# Patient Record
Sex: Female | Born: 1970 | Race: Black or African American | Hispanic: No | Marital: Single | Smoking: Never smoker
Health system: Southern US, Community
[De-identification: ages and names within clinical notes are randomized; demographics above are authoritative.]

## PROBLEM LIST (undated history)

## (undated) DIAGNOSIS — Z800 Family history of malignant neoplasm of digestive organs: Secondary | ICD-10-CM

## (undated) DIAGNOSIS — D649 Anemia, unspecified: Secondary | ICD-10-CM

## (undated) DIAGNOSIS — Z5189 Encounter for other specified aftercare: Secondary | ICD-10-CM

## (undated) DIAGNOSIS — Z803 Family history of malignant neoplasm of breast: Secondary | ICD-10-CM

## (undated) DIAGNOSIS — Z90710 Acquired absence of both cervix and uterus: Secondary | ICD-10-CM

## (undated) DIAGNOSIS — T7840XA Allergy, unspecified, initial encounter: Secondary | ICD-10-CM

## (undated) DIAGNOSIS — E559 Vitamin D deficiency, unspecified: Secondary | ICD-10-CM

## (undated) DIAGNOSIS — Z973 Presence of spectacles and contact lenses: Secondary | ICD-10-CM

## (undated) DIAGNOSIS — G43909 Migraine, unspecified, not intractable, without status migrainosus: Secondary | ICD-10-CM

## (undated) DIAGNOSIS — L918 Other hypertrophic disorders of the skin: Secondary | ICD-10-CM

## (undated) HISTORY — DX: Encounter for other specified aftercare: Z51.89

## (undated) HISTORY — DX: Acquired absence of both cervix and uterus: Z90.710

## (undated) HISTORY — DX: Presence of spectacles and contact lenses: Z97.3

## (undated) HISTORY — DX: Other hypertrophic disorders of the skin: L91.8

## (undated) HISTORY — DX: Migraine, unspecified, not intractable, without status migrainosus: G43.909

## (undated) HISTORY — DX: Family history of malignant neoplasm of digestive organs: Z80.0

## (undated) HISTORY — DX: Allergy, unspecified, initial encounter: T78.40XA

## (undated) HISTORY — DX: Anemia, unspecified: D64.9

## (undated) HISTORY — DX: Vitamin D deficiency, unspecified: E55.9

## (undated) HISTORY — DX: Family history of malignant neoplasm of breast: Z80.3

---

## 1998-08-08 ENCOUNTER — Emergency Department (HOSPITAL_COMMUNITY): Admission: EM | Admit: 1998-08-08 | Discharge: 1998-08-08 | Payer: Self-pay | Admitting: Emergency Medicine

## 1999-08-27 ENCOUNTER — Emergency Department (HOSPITAL_COMMUNITY): Admission: EM | Admit: 1999-08-27 | Discharge: 1999-08-27 | Payer: Self-pay | Admitting: Emergency Medicine

## 1999-10-10 ENCOUNTER — Emergency Department (HOSPITAL_COMMUNITY): Admission: EM | Admit: 1999-10-10 | Discharge: 1999-10-10 | Payer: Self-pay | Admitting: Emergency Medicine

## 1999-11-13 ENCOUNTER — Emergency Department (HOSPITAL_COMMUNITY): Admission: EM | Admit: 1999-11-13 | Discharge: 1999-11-13 | Payer: Self-pay | Admitting: Emergency Medicine

## 2003-05-14 ENCOUNTER — Emergency Department (HOSPITAL_COMMUNITY): Admission: EM | Admit: 2003-05-14 | Discharge: 2003-05-14 | Payer: Self-pay | Admitting: Emergency Medicine

## 2003-12-14 ENCOUNTER — Emergency Department (HOSPITAL_COMMUNITY): Admission: AD | Admit: 2003-12-14 | Discharge: 2003-12-14 | Payer: Self-pay | Admitting: Family Medicine

## 2005-07-10 ENCOUNTER — Inpatient Hospital Stay (HOSPITAL_COMMUNITY): Admission: EM | Admit: 2005-07-10 | Discharge: 2005-07-11 | Payer: Self-pay | Admitting: Emergency Medicine

## 2006-07-08 ENCOUNTER — Ambulatory Visit (HOSPITAL_COMMUNITY): Admission: RE | Admit: 2006-07-08 | Discharge: 2006-07-08 | Payer: Self-pay | Admitting: Family Medicine

## 2006-07-23 ENCOUNTER — Ambulatory Visit: Payer: Self-pay | Admitting: Obstetrics and Gynecology

## 2006-08-12 ENCOUNTER — Ambulatory Visit: Payer: Self-pay | Admitting: Gynecology

## 2006-09-16 ENCOUNTER — Ambulatory Visit: Payer: Self-pay | Admitting: Obstetrics and Gynecology

## 2006-10-26 ENCOUNTER — Encounter (INDEPENDENT_AMBULATORY_CARE_PROVIDER_SITE_OTHER): Payer: Self-pay | Admitting: Specialist

## 2006-10-26 ENCOUNTER — Ambulatory Visit: Payer: Self-pay | Admitting: Obstetrics and Gynecology

## 2006-10-26 ENCOUNTER — Inpatient Hospital Stay (HOSPITAL_COMMUNITY): Admission: RE | Admit: 2006-10-26 | Discharge: 2006-10-28 | Payer: Self-pay | Admitting: Obstetrics and Gynecology

## 2006-11-04 ENCOUNTER — Ambulatory Visit: Payer: Self-pay | Admitting: Obstetrics & Gynecology

## 2006-11-08 ENCOUNTER — Ambulatory Visit: Payer: Self-pay | Admitting: Family Medicine

## 2006-11-08 ENCOUNTER — Inpatient Hospital Stay (HOSPITAL_COMMUNITY): Admission: AD | Admit: 2006-11-08 | Discharge: 2006-11-08 | Payer: Self-pay | Admitting: Family Medicine

## 2006-11-11 ENCOUNTER — Ambulatory Visit: Payer: Self-pay | Admitting: Obstetrics & Gynecology

## 2006-11-18 ENCOUNTER — Ambulatory Visit: Payer: Self-pay | Admitting: Obstetrics & Gynecology

## 2006-12-02 ENCOUNTER — Ambulatory Visit: Payer: Self-pay | Admitting: Obstetrics & Gynecology

## 2007-06-01 ENCOUNTER — Emergency Department (HOSPITAL_COMMUNITY): Admission: EM | Admit: 2007-06-01 | Discharge: 2007-06-01 | Payer: Self-pay | Admitting: Emergency Medicine

## 2007-07-04 ENCOUNTER — Inpatient Hospital Stay (HOSPITAL_COMMUNITY): Admission: EM | Admit: 2007-07-04 | Discharge: 2007-07-06 | Payer: Self-pay | Admitting: Emergency Medicine

## 2007-12-02 HISTORY — PX: COLONOSCOPY: SHX174

## 2007-12-02 HISTORY — PX: ABDOMINAL HYSTERECTOMY: SHX81

## 2008-04-18 ENCOUNTER — Emergency Department (HOSPITAL_COMMUNITY): Admission: EM | Admit: 2008-04-18 | Discharge: 2008-04-19 | Payer: Self-pay | Admitting: Emergency Medicine

## 2008-05-18 ENCOUNTER — Emergency Department (HOSPITAL_COMMUNITY): Admission: EM | Admit: 2008-05-18 | Discharge: 2008-05-18 | Payer: Self-pay | Admitting: Emergency Medicine

## 2010-04-18 ENCOUNTER — Emergency Department (HOSPITAL_COMMUNITY): Admission: EM | Admit: 2010-04-18 | Discharge: 2010-04-19 | Payer: Self-pay | Admitting: Emergency Medicine

## 2010-11-07 ENCOUNTER — Emergency Department (HOSPITAL_COMMUNITY): Admission: EM | Admit: 2010-11-07 | Discharge: 2009-12-06 | Payer: Self-pay | Admitting: Emergency Medicine

## 2011-01-02 ENCOUNTER — Emergency Department (HOSPITAL_COMMUNITY)
Admission: EM | Admit: 2011-01-02 | Discharge: 2011-01-02 | Disposition: A | Discharge status: Home / Self Care | Payer: Self-pay | Attending: Emergency Medicine | Admitting: Emergency Medicine

## 2011-01-02 DIAGNOSIS — R05 Cough: Secondary | ICD-10-CM | POA: Insufficient documentation

## 2011-01-02 DIAGNOSIS — R059 Cough, unspecified: Secondary | ICD-10-CM | POA: Insufficient documentation

## 2011-01-02 DIAGNOSIS — J069 Acute upper respiratory infection, unspecified: Secondary | ICD-10-CM | POA: Insufficient documentation

## 2011-01-02 DIAGNOSIS — R5381 Other malaise: Secondary | ICD-10-CM | POA: Insufficient documentation

## 2011-01-02 DIAGNOSIS — H60399 Other infective otitis externa, unspecified ear: Secondary | ICD-10-CM | POA: Insufficient documentation

## 2011-01-02 DIAGNOSIS — H9209 Otalgia, unspecified ear: Secondary | ICD-10-CM | POA: Insufficient documentation

## 2011-01-02 DIAGNOSIS — R110 Nausea: Secondary | ICD-10-CM | POA: Insufficient documentation

## 2011-01-02 DIAGNOSIS — R070 Pain in throat: Secondary | ICD-10-CM | POA: Insufficient documentation

## 2011-01-02 DIAGNOSIS — R42 Dizziness and giddiness: Secondary | ICD-10-CM | POA: Insufficient documentation

## 2011-01-02 DIAGNOSIS — R630 Anorexia: Secondary | ICD-10-CM | POA: Insufficient documentation

## 2011-02-17 LAB — URINALYSIS, ROUTINE W REFLEX MICROSCOPIC
Bilirubin Urine: NEGATIVE
Glucose, UA: NEGATIVE mg/dL
Ketones, ur: NEGATIVE mg/dL
Protein, ur: 30 mg/dL — AB
Specific Gravity, Urine: 1.02 (ref 1.005–1.030)
Urobilinogen, UA: 0.2 mg/dL (ref 0.0–1.0)
pH: 6.5 (ref 5.0–8.0)

## 2011-02-17 LAB — URINE MICROSCOPIC-ADD ON

## 2011-04-15 NOTE — Op Note (Signed)
[name], Tara Rodriguez                  ACCOUNT NO.:  [id number]   MEDICAL RECORD NO.:  [id number]          PATIENT TYPE:  INP   LOCATION:  5023                         FACILITY:  [facility]   PHYSICIAN:  Suzanna Obey, M.D.       DATE OF BIRTH:  04-05-1971   DATE OF PROCEDURE:  07/04/2007  DATE OF DISCHARGE:                               OPERATIVE REPORT   PREOPERATIVE DIAGNOSIS:  Left nasal abscess.   POSTOPERATIVE DIAGNOSIS:  Left nasal abscess.   SURGICAL PROCEDURES:  Incision and drainage of left nasal abscess.   ANESTHESIA:  General.   ESTIMATED BLOOD LOSS:  Less than 5 mL.   INDICATIONS:  This is a 40 year old whose has developed a left facial  swelling that has come on over a few days.  It is tender in the lateral  aspect of her nose.  She has an abscess noted on CT scan.  She was  informed of the risk and benefits of the procedure as well as options.  All of her questions were answered and consent was obtained.   OPERATION:  The patient was taken to the operating room and placed in a  supine position.  After adequate general endotracheal tube anesthesia,  she was prepped and draped in the usual sterile manner.  An incision was  made just inside the lateral vestibule on the left side that had  bulging; this was opened up and there was purulent necrotic material  present that was cultured, both anaerobic and aerobic.  The wound was  opened up with a hemostat in all directions.  The wound was irrigated  and Iodoform gauze and drain were placed.  The oral cavity and  oropharynx were suctioned out of all irrigation and debris.  She was  awakened and brought to Recovery in stable condition.   COUNTS:  Correct.           ______________________________  Suzanna Obey, M.D.     JB/MEDQ  D:  07/04/2007  T:  07/05/2007  Job:  [id number]

## 2011-04-18 NOTE — Op Note (Signed)
Tara Rodriguez, Tara Rodriguez                 ACCOUNT NO.:  [id number]   MEDICAL RECORD NO.:  [id number]          PATIENT TYPE:  INP   LOCATION:  9308                          [facility]   PHYSICIAN:  Phil D. Okey Dupre, M.D.     DATE OF BIRTH:  1971/08/04   DATE OF PROCEDURE:  10/26/2006  DATE OF DISCHARGE:                                 OPERATIVE REPORT   PROCEDURE:  Total abdominal hysterectomy.   PREOPERATIVE DIAGNOSIS:  Symptomatic fibroids.   POSTOPERATIVE DIAGNOSIS:  Symptomatic fibroids.   SURGEON:  Javier Glazier. Okey Dupre, M.D.   FIRST ASSISTANT:  Shelbie Proctor. Shawnie Pons, M.D.   ESTIMATED BLOOD LOSS:  400 cc.   SPECIMEN TO PATHOLOGY:  Uterus.   POSTOPERATIVE CONDITION:  Satisfactory.   ANESTHESIA:  General.   OPERATIVE FINDINGS:  On entering the peritoneal cavity, the uterus was  enlarged to the size of a 16-week pregnancy, easily mobile, with a large  irregular intramural fibroid in the right side which displaced the ovary  posteriorly on that side.  The ovaries were normal.  There were no other  abnormalities noted.   PROCEDURE:  Under satisfactory general anesthesia, with the patient in the  dorsal supine position, the abdomen was prepped and draped in the usual  sterile manner, with a Foley catheter in the urinary bladder.  The abdomen  was entered through a vertical midline incision extending from just below  the umbilicus to just above the symphysis pubis.  The abdomen was entered by  layers. On entering the peritoneal cavity, the findings were as above.  The  round ligaments were identified, ligated with 1 chromic catgut suture  ligature, then divided after straight Kelly clamps had been used to place  across the uterine pedicles that included the fallopian tube, the meso  beneath that, down to the round ligament for traction and hemostasis. The  broad ligament was then opened anteriorly around to the anterior superior  portion of the cervix.  Then, the bladder was pushed away from the  lower  anterior surface of the cervix. An opening was made in an avascular portion  of the broad ligament, through which Haney clamps were used to clamp medial  to the ovary tissue medially, divided, and the lateral pedicle ligated with  1 chromic catgut suture ligature as well as a free tie.  Areas were observed  for bleeding.  None was noted, and the posterior leaf of the broad ligament  opened and the uterine vessels skeletonized.  The uterine vessels were then  clamped, divided, and ligated with 1 chromic catgut suture ligatures, as  were the cardinal ligaments using straight Haney clamps. The cervix was then  dissected away from the apex of the vagina by sharp dissection, and angle  sutures of 1 chromic were placed in each of the vaginal cuff angles for  hemostasis.  On the left side, a second suture had to be placed there  because of some leakage of blood, and at this point we were well above the  ureters. The cuff was then closed with a continuous running-locked 1  chromic  suture on an atraumatic needle. All sutures were then cut short.  The pelvis  was observed to make sure there was no active bleeding.  None was noted.  The tapes were removed from the abdomen.  The upper abdominal viscera was  explored and found to be within normal limits.  The __________ Laural Benes  closure using looped 0 PDS suture was used from either incision meeting in  the midpoint and being tied there after the pelvis had been irrigated.  Tape, instrument, sponge, and needles were reported correct at this time,  and the subcutaneous tissue was once again irrigated, and the skin edges  approximated with skin staples.  Dry sterile dressing was applied.  The  patient was transferred to the recovery room in satisfactory condition,  having tolerated the procedure well, with Foley catheter draining clear  amber urine.           ______________________________  Javier Glazier Okey Dupre, M.D.     PDR/MEDQ  D:  10/26/2006   T:  10/26/2006  Job:  [id number]

## 2011-04-18 NOTE — Group Therapy Note (Signed)
NAMESHAYNE, Tara Rodriguez NO.:  [id number]   MEDICAL RECORD NO.:  [id number]          PATIENT TYPE:  WOC   LOCATION:  [facility]                   FACILITY:  [facility]   PHYSICIAN:  Argentina Donovan, MD        DATE OF BIRTH:  1971/07/08   DATE OF SERVICE:                                    CLINIC NOTE   CHIEF COMPLAINT:  Constant bleeding and swelling of abdomen.   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  She was seen at the  health department for routine exam some time ago and elicited an enlarged  uterus of about the size of a 20-week gestation.  She was a one-time admit  to the hospital and needed four units of blood transfusion a year ago  because of DUB.  Comes in today after we have treated her with Depo-Lupron  and there is no significant reduction in the size of the uterus and she has  continued to spot or bleed.  We are going to schedule her for total  abdominal hysterectomy.   PAST MEDICAL HISTORY:  Not significant except for the anemia.  She has had  no surgical history.  Her deliveries have all been vaginal and she had one  VIP.   SOCIAL HISTORY:  She does not smoke, drink alcohol or intake illicit drugs.   FAMILY HISTORY:  Both grandmothers had cancer and one had breast and one had  colon.  Otherwise, there is no significant family history.   REVIEW IS SYSTEMS:  Negative with the exception of the present illness.   PHYSICAL EXAMINATION:  A blood pressure is 114/77, pulse 70, temperature is  98.6. The patient's weight is 200 pounds and height 5 foot 5.  A well-  developed, well-nourished African American female in no acute distress.  HEENT:  Within normal limits.  The neck is supple with no masses.  Symmetrical thyroid with no dominant masses.  BREASTS:  Symmetrical without masses.  LUNGS:  Clear to auscultation and percussion.  HEART:  No murmur.  Normal sinus  rhythm.  ABDOMEN:  Soft with a palpable mass up to the umbilicus lobular especially  on the right side consistent with a large fibroid uterus.  No CVA  tenderness.  The back is erect.  PELVIC:  External genitalia is normal.  BUN is within normal limits.  The  vagina is clean and well rugated.  The cervix is clean and parous.  The  patient had a recently normal Pap smear at the health department.  EXTREMITIES:  No edema.  No varices.  No clubbing.  Deep tendon reflexes are  within normal limits and skin is normal turgor.   IMPRESSION:  Symptomatic fibroids with secondary anemia for total abdominal  hysterectomy.           ______________________________  Argentina Donovan, MD     PR/MEDQ  D:  09/16/2006  T:  09/17/2006  Job:  [id number]

## 2011-04-18 NOTE — Group Therapy Note (Signed)
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  Was seen at the health  department for her routine exam and Pap smear which were done several weeks  ago.  At that time they noticed an enlarged uterus, got an ultrasound which  showed a 20 weeks' size uterus with multiple leiomyomata uteri and she was  sent here for follow-up and treatment.  The patient apparently was admitted  without D&C and treated just with 4 unit of blood transfusion one year ago  at [facility] for severe anemia and from dysfunctional uterine  bleeding.  Her periods are regular at the present time and heavy and she has  been and is now on iron therapy regularly.  Her hemoglobin when last done at  the health department was in the area of 7.  We have talked to her how  important it was to have surgery done and what we would like to do is see if  we cannot shrink the uterus down.  She has no insurance so were going to try  and apply to [employer] for their donor program for this lady and get a one-time  11.25 mg injection of Depo Lupron.  When that comes in she will be called,  brought in, given the injection, and scheduled for an appointment one month  later.  At one month if her hemoglobin is stabilized and there is no problem  we will schedule her at the three-month mark for total abdominal  hysterectomy.   IMPRESSION:  Symptomatic fibroids with secondary anemia.           ______________________________  Argentina Donovan, MD     PR/MEDQ  D:  07/23/2006  T:  07/23/2006  Job:  [id number]

## 2011-04-18 NOTE — Consult Note (Signed)
Tara Rodriguez, Tara Rodriguez                 ACCOUNT NO.:  [id number]   MEDICAL RECORD NO.:  [id number]          PATIENT TYPE:  INP   LOCATION:  5705                         FACILITY:  [facility]   PHYSICIAN:  Bernette Redbird, M.D.   DATE OF BIRTH:  11/26/1971   DATE OF CONSULTATION:  07/11/2005  DATE OF DISCHARGE:  07/11/2005                                   CONSULTATION   Dr. Hannah Beat of the [facility] asked [place] to see this very pleasant  unassigned 40 year old African-American female because of severe microcytic  anemia.   Alonzo was admitted to the hospital yesterday with a chief complaint of  weakness, back pain and headache, and was found to be profoundly anemic with  a hemoglobin of 5.1, MCV 57, RDW 27, ferritin level low at 4, and iron level  undetectable.   She has a history of menorrhagia.   From the GI perspective, however, she also has had intermittent rectal  bleeding (several episodes of bright red blood with bowel movements over the  past several weeks), although her stool was hemoccult negative by Dr.  Samantha Crimes rectal exam. She also has a longstanding history of reflux which  was diagnosed through the emergency room several years ago and she has been  using Prilosec ever since, although more recently, she has been having some  breakthrough symptoms with midabdominal discomfort radiating superiorly up  toward the sternum, sometimes bothering her at night. No problem with  anorexia, weight loss, dysphagia constipation, diarrhea, or chronic  persistent abdominal pain.   ALLERGIES:  No known allergies.   CURRENT MEDICATIONS:  Occasional Advils, fewer than 10 a month.   OPERATIONS:  None.   CHRONIC MEDICAL ILLNESSES:  None.   HABITS:  Nonsmoker, nondrinker.   FAMILY HISTORY:  Colon cancer in her paternal grandmother when she was in  her 4s, otherwise negative for GI illnesses.   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   REVIEW OF  SYSTEMS:  Per HPI.   PHYSICAL EXAMINATION:  VITAL SIGNS:  Temperature 97.5, blood pressure  129/70, pulse 61, respirations 18.  HEENT:  The patient is anicteric and has slight skin pallor. She was not  otherwise examined at this time.   IMPRESSION:  1.  Iron deficiency anemia, profound, with heme-negative stool.  2.  History of menorrhagia, most likely accounting for #1.  3.  History of intermittent small volume hematochezia over the past several      weeks suggestive of hemorrhoidal origin with currently heme-negative      stool, unlikely to be related to the patient's anemia.  4.  Remote family history of colon cancer.  5.  History of significant reflux with chronic Prilosec therapy but now with      breakthrough symptomatology despite Prilosec.   COMMENT:  The patient's chronic Prilosec therapy could be contributing to  her anemia by interfering with iron absorption.   PLAN:  The patient would benefit from upper endoscopy and colonoscopy. The  endoscopy would help to evaluate her reflux symptoms and confirm the absence  of a large hiatal hernia which could conceivably account for her anemia.  Colonoscopy will evaluate the rectal bleeding and provide screening in view  of her remote family history of colon cancer. Both procedures, in addition  to evaluating symptoms, will provide evaluation for the anemia. The patient  is agreeable to the idea of having these exams and will follow-up with me in  the office in approximately 10 days to set them up, at which time I can go  over the in more detail with the patient, including risks, alternatives and  so forth.       RB/MEDQ  D:  07/11/2005  T:  07/12/2005  Job:  [id number]

## 2011-04-18 NOTE — Discharge Summary (Signed)
Tara, Rodriguez                 ACCOUNT NO.:  [id number]   MEDICAL RECORD NO.:  [id number]          PATIENT TYPE:  INP   LOCATION:  9308                          [facility]   PHYSICIAN:  Phil D. Okey Dupre, M.D.     DATE OF BIRTH:  11-21-1971   DATE OF ADMISSION:  10/26/2006  DATE OF DISCHARGE:  10/28/2006                                 DISCHARGE SUMMARY   The patient is a 40 year old African American female who underwent total  abdominal hysterectomy on the day of admission for large symptomatic  fibroids.  She has done very well up until now, has been afebrile  postoperatively.  She has a discharge hemoglobin of 12.3 with hematocrit of  37.5.  She is passing gas, voiding well and has a clean healthy-looking,  healing vertical abdominal incisional scar.  The patient will return to the  GYN clinic in one week for staple removal and the following week for follow-  up.   DISCHARGE MEDICATIONS:  Motrin and Percocet.   She has been given detailed instructions as to activity, follow-up and diet.  Pathology is not available yet.   DISCHARGE DIAGNOSIS:  Satisfactory progress post total abdominal  hysterectomy.           ______________________________  Javier Glazier Okey Dupre, M.D.     PDR/MEDQ  D:  10/28/2006  T:  10/28/2006  Job:  [id number]

## 2011-04-18 NOTE — Group Therapy Note (Signed)
Tara Rodriguez, Tara Rodriguez                 ACCOUNT NO.:  [id number]   MEDICAL RECORD NO.:  [id number]          PATIENT TYPE:  WOC   LOCATION:  [facility]                   [facility]ACILITY:  [facility]   PHYSICIAN:  Elsie Lincoln, MD      DATE OF BIRTH:  [birth year]   DATE OF SERVICE:                                  CLINIC NOTE   The patient is a 40 year old female who is 5 weeks out from a TAH for  large symptomatic fibroids.  She disease have a womb disruption that is  now completely healed.  She has had sexual intercourse once that was non-  painful.  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  I believe 6 weeks is an appropriate time, because she does lift very  heavy patients, and I think that she needs the full 6 weeks to recover.  Postoperatively, I think the patient is doing well.   Temperature 98.1, pulse 81, blood pressure 129/85, weight 203.6, height  5 feet 9 inches.  ABDOMEN:  Soft, nontender, nondistended.  No hernia.  Well-healed  midline incision.  GENITALIA:  __________  Vagina, a small amount of discharge, but the  patient did have sex this morning.  There is a small piece of  granulation tissue, to which silver nitrate was applied.  On bimanual  exam, there was no pain at the vaginal cuff or during the bimanual exam.   ASSESSMENT AND PLAN:  A 40 year old female 5 weeks postoperative from a  transabdominal hysterectomy.  There was benign pathology.  The patient  is not menopausal, as she still has her ovaries.  1. No Pap smears needed, as she did not have the surgery for      dysplasia, and the pathology of the cervix is unremarkable.  I am      assuming this means no cervical intraepithelial neoplasia      dysplasia.  2. The patient return to work January 10, and note given.  3. Come back to clinic in a year.  4. Mammogram age 1.           ______________________________  Elsie Lincoln, MD     KL/MEDQ  D:  12/02/2006  T:  12/02/2006  Job:  [id number]

## 2011-04-18 NOTE — H&P (Signed)
Tara Rodriguez, Tara Rodriguez                 ACCOUNT NO.:  [id number]   MEDICAL RECORD NO.:  [id number]          PATIENT TYPE:  EMS   LOCATION:  [facility]                         FACILITY:  [facility]   PHYSICIAN:  Danae Chen, M.D.DATE OF BIRTH:  04-Oct-1971   DATE OF ADMISSION:  07/10/2005  DATE OF DISCHARGE:                                HISTORY & PHYSICAL   PRIMARY CARE PHYSICIAN:  None.   CHIEF COMPLAINT:  Weakness, back pain, headache.   HISTORY OF PRESENT ILLNESS:  The patient is a 41 year old African-American  female who reports that she has had a 2-3 month history of increasing  fatigue, low back pain, headache, mild nausea. She has also had one or two  episodes of bright red blood in her stool which has been new for her but had  not been constant. She has not seen a physician in over 14 years. She denies  any prior medical history. No past surgical history. She is on no current  medications.   ALLERGIES:  She reports no drug allergies.   SOCIAL HISTORY:  She is single, has four teenagers. She denies any alcohol  or tobacco use. 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   REVIEW OF SYSTEMS:  She reports no recent weight gain or weight loss. No  nausea or vomiting or diarrhea. Had a slight headache, low back pain. Some  chest discomfort. Fatigue on exertion.   PHYSICAL EXAMINATION:  VITAL SIGNS:  Blood pressure is 117/58, pulse 103, O2  saturation is 98% on room air.  HEENT:  Head is atraumatic, normocephalic. Oropharynx is clear. She has some  scleral pallor. Buccal mucosal membranes are slightly dry.  HEART:  Rate is regular.  ABDOMEN:  Soft, normal S1, S2.  LUNGS:  Clear to auscultation bilaterally.  EXTREMITIES:  Are warm, 2+ femoral and dorsalis pedis pulses, no peripheral  edema.  NEUROLOGIC:  She alert and oriented. No focal neurologic deficits are noted.   EKG shows normal sinus rhythm with possible left atrial enlargement,  nonspecific  ST abnormalities. No acute ST, T wave changes seen.   Urine pregnancy test is negative. Leukocytes esterase is moderate with 21-50  white blood cells, few bacteria. Sodium 139, potassium 3.1, chloride 110,  CO2 23, glucose 123, BUN 4, creatinine 0.9. CK-MB negative. Troponin is  negative. Urine drug screen is negative.  Hemoglobin is 5.1 with white count 11.2 thousand, platelets of 689,000 with  an MCV of 56.   ASSESSMENT:  A 40 year old with marked anemia, hypokalemia, and probable  cystitis bladder infection.  Given the patient's menstrual cycle history and lack of follow up with an  OB/GYN or other primary care physician, I suspect that her microcytic anemia  is related to menstruation and iron deficiency. However she has also had an  episode of bright red blood per rectum. This may or may not be contributing  as well to her anemia, but needs to be screened for as well. At this time  will admit her for observation and for blood transfusion. We will check  an  anemia panel with iron, ferritin and TIBC levels. Will also check fecal  occult blood test as well. Will place her potassium and follow up with these  results. If it is found that she does have heme positive stool, then further  evaluation of her GI is warranted, however, if she has heme negative stool  and her iron studies come back showing iron-deficiency anemia, then we can  transfuse her blood, recheck CBC and place her on iron and follow up again  as an outpatient.      Danae Chen, M.D.  Electronically Signed     RLK/MEDQ  D:  07/10/2005  T:  07/10/2005  Job:  [id number]

## 2011-04-18 NOTE — Discharge Summary (Signed)
NAMECYNDY, Rodriguez                  ACCOUNT NO.:  [id number]   MEDICAL RECORD NO.:  [id number]          PATIENT TYPE:  INP   LOCATION:  5023                         FACILITY:  [facility]   PHYSICIAN:  Suzanna Obey, M.D.       DATE OF BIRTH:  Jul 15, 1971   DATE OF ADMISSION:  07/04/2007  DATE OF DISCHARGE:  07/06/2007                               DISCHARGE SUMMARY   ADMISSION DIAGNOSIS:  Left nasal abscess.   DISCHARGE DIAGNOSIS:  Left nasal abscess.   SURGICAL PROCEDURES:  Incision and drainage of left nasal abscess.   HISTORY OF PRESENT ILLNESS:  This is a 40 year old who has had swelling  in the left nasal region that was extremely tender and swelling across  the face for many days.  She recently had a tooth extracted in the lower  dentition.   She clearly has an abscess which was taken to the operating room on  August 3 and a drainage procedure was performed.  There was still some  swelling on postop day 1 but pain was controlled.  Her lip was still a  bit distorted and as well as the left cheek was still swollen.  She was  continued on intravenous antibiotics.   On postop day 2, there was substantial decrease in the swelling and the  lip was far less distorted.  The packing from the incision area was  removed.  She was discharged on clindamycin 300 mg t.i.d. and Lortab 7.5  one to two every 4 hours as needed for pain.  She is to follow up in a  few days, sooner if anything worsens.           ______________________________  Suzanna Obey, M.D.     JB/MEDQ  D:  08/19/2007  T:  08/19/2007  Job:  [id number]

## 2011-08-27 LAB — COMPREHENSIVE METABOLIC PANEL
AST: 19
Albumin: 3.8
Alkaline Phosphatase: 69
Calcium: 8.8
GFR calc Af Amer: >60
Potassium: 3.3 — ABNORMAL LOW
Sodium: 138
Total Bilirubin: 0.4
Total Protein: 7.1

## 2011-08-27 LAB — DIFFERENTIAL
Basophils Relative: 1
Eosinophils Relative: 0
Lymphocytes Relative: 43
Monocytes Absolute: 0.5
Monocytes Relative: 5
Neutro Abs: 4.7

## 2011-08-27 LAB — CBC
HCT: 41.4
MCV: 89.4
Platelets: 307
WBC: 9.3

## 2011-08-27 LAB — URINALYSIS, ROUTINE W REFLEX MICROSCOPIC
Bilirubin Urine: NEGATIVE
Hgb urine dipstick: NEGATIVE
Nitrite: NEGATIVE
Protein, ur: NEGATIVE

## 2011-08-27 LAB — URINE MICROSCOPIC-ADD ON

## 2011-08-28 LAB — DIFFERENTIAL
Basophils Absolute: 0.2 — ABNORMAL HIGH
Basophils Relative: 2 — ABNORMAL HIGH
Eosinophils Absolute: 0.1
Eosinophils Relative: 1
Lymphs Abs: 3.6
Monocytes Absolute: 0.8
Monocytes Relative: 8
Neutro Abs: 5
Neutrophils Relative %: 52

## 2011-08-28 LAB — COMPREHENSIVE METABOLIC PANEL
ALT: 29
AST: 22
Albumin: 3.9
Chloride: 104
Potassium: 3.5
Sodium: 136
Total Bilirubin: 0.4
Total Protein: 7.2

## 2011-08-28 LAB — CBC
HCT: 41.5
Hemoglobin: 13.7
MCHC: 32.9
MCV: 88.2

## 2011-09-15 LAB — I-STAT 8, (EC8 V) (CONVERTED LAB)
Acid-base deficit: 4 — ABNORMAL HIGH
BUN: 6
Glucose, Bld: 145 — ABNORMAL HIGH
HCT: 44
Hemoglobin: 15
Operator id: 257131
Potassium: 3.5
Sodium: 139
TCO2: 23
pH, Ven: 7.343 — ABNORMAL HIGH

## 2011-09-15 LAB — POCT I-STAT CREATININE
Creatinine, Ser: 0.9
Operator id: 257131

## 2011-09-15 LAB — CULTURE, ROUTINE-ABSCESS: Gram Stain: NONE SEEN

## 2011-09-15 LAB — ANAEROBIC CULTURE

## 2011-09-15 LAB — DIFFERENTIAL
Eosinophils Relative: 1
Monocytes Relative: 8
Neutro Abs: 5.9

## 2011-09-15 LAB — CBC: WBC: 9.6

## 2011-09-16 LAB — I-STAT 8, (EC8 V) (CONVERTED LAB)
Chloride: 106
Glucose, Bld: 90
HCT: 50 — ABNORMAL HIGH
pH, Ven: 7.385 — ABNORMAL HIGH

## 2011-09-16 LAB — URINALYSIS, ROUTINE W REFLEX MICROSCOPIC
Hgb urine dipstick: NEGATIVE
Protein, ur: NEGATIVE
Urobilinogen, UA: 0.2

## 2011-09-16 LAB — URINE MICROSCOPIC-ADD ON

## 2011-09-16 LAB — POCT I-STAT CREATININE: Operator id: 234501

## 2012-10-18 ENCOUNTER — Emergency Department (HOSPITAL_COMMUNITY)
Admission: EM | Admit: 2012-10-18 | Discharge: 2012-10-18 | Disposition: A | Discharge status: Home / Self Care | Payer: Self-pay | Attending: Emergency Medicine | Admitting: Emergency Medicine

## 2012-10-18 ENCOUNTER — Encounter (HOSPITAL_COMMUNITY): Payer: Self-pay | Admitting: Emergency Medicine

## 2012-10-18 DIAGNOSIS — J069 Acute upper respiratory infection, unspecified: Secondary | ICD-10-CM | POA: Insufficient documentation

## 2012-10-18 DIAGNOSIS — H9209 Otalgia, unspecified ear: Secondary | ICD-10-CM | POA: Insufficient documentation

## 2012-10-18 DIAGNOSIS — Z7982 Long term (current) use of aspirin: Secondary | ICD-10-CM | POA: Insufficient documentation

## 2012-10-18 DIAGNOSIS — J3489 Other specified disorders of nose and nasal sinuses: Secondary | ICD-10-CM | POA: Insufficient documentation

## 2012-10-18 MED ORDER — ACETAMINOPHEN-CODEINE 120-12 MG/5ML PO SOLN: 10.0000 mL | ORAL | Status: DC | PRN

## 2012-10-18 MED ORDER — GUAIFENESIN ER 1200 MG PO TB12: 1.0000 | ORAL_TABLET | Freq: Two times a day (BID) | ORAL | Status: DC

## 2012-10-18 MED ORDER — PREDNISONE 50 MG PO TABS: 50.0000 mg | ORAL_TABLET | Freq: Every day | ORAL | Status: DC

## 2012-10-18 NOTE — ED Provider Notes (Signed)
History     CSN: [id number]  Arrival date & time 10/18/12  1559   First MD Initiated Contact with Patient 10/18/12 1627      Chief Complaint  Patient presents with  . Headache  . Otalgia    (Consider location/radiation/quality/duration/timing/severity/associated sxs/prior treatment) HPI Patient presents emergency department with a two-week history of nasal congestion, and sinus pain. patient states has tried over-the-counter nasal decongestants without relief. patient denies fever, cough, nausea, vomiting, abdominal pain,shortness of breath, wheezing or back pain. Patient states that she is not feeling any better. The patient states that she also has a mild earache on the L.  History reviewed. No pertinent past medical history.  History reviewed. No pertinent past surgical history.  No family history on file.  History  Substance Use Topics  . Smoking status: Never Smoker   . Smokeless tobacco: Not on file  . Alcohol Use: No    OB History    Grav Para Term Preterm Abortions TAB SAB Ect Mult Living                  Review of Systems All other systems negative except as documented in the HPI. All pertinent positives and negatives as reviewed in the HPI.  Allergies  Review of patient's allergies indicates no known allergies.  Home Medications   Current Outpatient Rx  Name  Route  Sig  Dispense  Refill  . ASPIRIN-ACETAMINOPHEN-CAFFEINE 260-130-16 MG PO TABS   Oral   Take 1 packet by mouth once. For headache.         Marland Kitchen PSEUDOEPHEDRINE-ACETAMINOPHEN 30-325 MG PO TABS   Oral   Take 2 tablets by mouth every 8 (eight) hours as needed. For nasal congestion.           BP 137/91  Pulse 73  Temp 98.5 F (36.9 C) (Oral)  Resp 12  SpO2 100%  Physical Exam  Nursing note and vitals reviewed. Constitutional: She is oriented to person, place, and time. She appears well-developed.  HENT:  Head: Normocephalic and atraumatic.  Right Ear: Tympanic membrane normal.    Left Ear: Tympanic membrane normal.  Nose: Mucosal edema and rhinorrhea present. Right sinus exhibits no maxillary sinus tenderness and no frontal sinus tenderness. Left sinus exhibits frontal sinus tenderness. Left sinus exhibits no maxillary sinus tenderness.  Mouth/Throat: Uvula is midline and oropharynx is clear and moist. No uvula swelling.  Eyes: Pupils are equal, round, and reactive to light.  Neck: Normal range of motion. Neck supple.  Cardiovascular: Normal rate, regular rhythm and normal heart sounds.  Exam reveals no gallop and no friction rub.   No murmur heard. Pulmonary/Chest: Effort normal and breath sounds normal. No respiratory distress.  Neurological: She is alert and oriented to person, place, and time.  Skin: Skin is warm and dry. No rash noted.    ED Course  Procedures (including critical care time) The patient will be treated for sinusitis. The patient is advised to return here as needed. Told to increase her fluids. MDM         Carlyle Dolly, PA-C 10/18/12 [phone]

## 2012-10-18 NOTE — ED Provider Notes (Signed)
Medical screening examination/treatment/procedure(s) were performed by non-physician practitioner and as supervising physician I was immediately available for consultation/collaboration.   Celene Kras, MD 10/18/12 [phone]

## 2012-10-18 NOTE — ED Notes (Addendum)
Pt presenting to ed with c/o sinus pressure with headache pain, left side earache pain and neck pain. Pt states symptoms are all on her left side. Pt states she is blowing greenish/yellowish colored mucus from her nose. Pt states onset x 1 week but OTC medications are not helping. Pt states she also had a sore throat but it has resolved

## 2013-11-11 ENCOUNTER — Encounter: Payer: Self-pay | Admitting: Medical

## 2013-11-11 ENCOUNTER — Ambulatory Visit (INDEPENDENT_AMBULATORY_CARE_PROVIDER_SITE_OTHER): Payer: 59 | Admitting: Medical

## 2013-11-11 VITALS — BP 120/80 | HR 68 | Temp 98.2°F | Resp 16 | Wt 213.0 lb

## 2013-11-11 DIAGNOSIS — K036 Deposits [accretions] on teeth: Secondary | ICD-10-CM

## 2013-11-11 DIAGNOSIS — J309 Allergic rhinitis, unspecified: Secondary | ICD-10-CM

## 2013-11-11 MED ORDER — AZELASTINE-FLUTICASONE 137-50 MCG/ACT NA SUSP: 1.0000 | Freq: Two times a day (BID) | NASAL | Status: DC

## 2013-11-11 NOTE — Progress Notes (Signed)
Subjective:  Tara Rodriguez is a 42 y.o. female who presents as a new patient.  Referred by a coworker.  Symptoms include left ear pain, sore throat x 57mo.  Has some sinus pressure she keeps, nostrils with alternate between being stopped.  Gets some headaches.  Gets seasonal allergies.  Had a cold a few weeks ago, but got a little better, but now she has the current symptoms and persistent ear pain.   Feels sleepy.  Getting some clear nasal drainage, some darker thick mucous from coughing up on occasion.   Denies cough, NVD, dizziness, no fever, no teeth pain. Treatment to date: using Tyelnol severe cold.  Denies sick contacts.  No other aggravating or relieving factors.  No snoring.  No witnessed apnea.  No other c/o.  ROS as in subjective.   Objective: Filed Vitals:   11/11/13 0940  BP: 120/80  Pulse: 68  Temp: 98.2 F (36.8 C)  Resp: 16    General appearance: Alert, WD/WN, no distress, AA female                             Skin: warm, no rash                           Head: no sinus tenderness                            Eyes: conjunctiva normal, corneas clear, PERRLA                            Ears: pearly TMs, external ear canals normal                          Nose: septum midline, turbinates swollen, L>R, +clear discharge             Mouth/throat: MMM, tongue normal, mild pharyngeal erythema, +moderate lower teeth dental plaque                           Neck: supple, no adenopathy, no thyromegaly, nontender                         Lungs: CTA bilaterally, no wheezes, rales, or rhonchi     Assessment: Encounter Diagnoses  Name Primary?  . Allergic rhinitis Yes  . Dental plaque     Plan: Allergic rhinitis -  Your current exam and symptoms suggest environmental allergies causing the nasal congestion, ear pain, and sore throat.  Begin Dymista nasal spray, 1 spray per nostril twice daily for at least 2 weeks, then can use once daily.  For the next week use salt water gargles, warm  fluids such as tea and coffee,  Increase water intake, consider taking over-the-counter Sudafed or nasal decongestant/sinus decongestant for the next week.  If symptoms not much improved within 2 weeks then recheck. However, if worse or no improvement all within a week then call back.  Dental plaque - f/u with dentist   Consider returning for a full physical soon.

## 2013-11-11 NOTE — Patient Instructions (Signed)
Your current exam and symptoms suggest environmental allergies causing the nasal congestion, ear pain, and sore throat.  Begin Dymista nasal spray, 1 spray per nostril twice daily for at least 2 weeks, then can use once daily.    For the next week use salt water gargles, warm fluids such as tea and coffee,  Increase water intake, consider taking over-the-counter Sudafed or nasal decongestant/sinus decongestant for the next week.  If symptoms not much improved within 2 weeks then recheck. However, if worse or no improvement all within a week then call back.  Consider returning for a full physical soon.  Allergic Rhinitis Allergic rhinitis is when the mucous membranes in the nose respond to allergens. Allergens are particles in the air that cause your body to have an allergic reaction. This causes you to release allergic antibodies. Through a chain of events, these eventually cause you to release histamine into the blood stream (hence the use of antihistamines). Although meant to be protective to the body, it is this release that causes your discomfort, such as frequent sneezing, congestion and an itchy runny nose.  CAUSES  The pollen allergens may come from grasses, trees, and weeds. This is seasonal allergic rhinitis, or "hay fever." Other allergens cause year-round allergic rhinitis (perennial allergic rhinitis) such as house dust mite allergen, pet dander and mold spores.  SYMPTOMS   Nasal stuffiness (congestion).  Runny, itchy nose with sneezing and tearing of the eyes.  There is often an itching of the mouth, eyes and ears. It cannot be cured, but it can be controlled with medications. DIAGNOSIS  If you are unable to determine the offending allergen, skin or blood testing may find it. TREATMENT   Avoid the allergen.  Medications and allergy shots (immunotherapy) can help.  Hay fever may often be treated with antihistamines in pill or nasal spray forms. Antihistamines block the  effects of histamine. There are over-the-counter medicines that may help with nasal congestion and swelling around the eyes. Check with your caregiver before taking or giving this medicine. If the treatment above does not work, there are many new medications your caregiver can prescribe. Stronger medications may be used if initial measures are ineffective. Desensitizing injections can be used if medications and avoidance fails. Desensitization is when a patient is given ongoing shots until the body becomes less sensitive to the allergen. Make sure you follow up with your caregiver if problems continue. SEEK MEDICAL CARE IF:   You develop fever (more than 100.5 F (38.1 C).  You develop a cough that does not stop easily (persistent).  You have shortness of breath.  You start wheezing.  Symptoms interfere with normal daily activities. Document Released: 08/12/2001 Document Revised: 02/09/2012 Document Reviewed: 02/21/2009 [facility] Patient Information 2014 [place], [place].

## 2013-12-27 ENCOUNTER — Emergency Department (HOSPITAL_COMMUNITY)
Admission: EM | Admit: 2013-12-27 | Discharge: 2013-12-28 | Disposition: A | Discharge status: Home / Self Care | Payer: 59 | Attending: Emergency Medicine | Admitting: Emergency Medicine

## 2013-12-27 ENCOUNTER — Encounter (HOSPITAL_COMMUNITY): Payer: Self-pay | Admitting: Emergency Medicine

## 2013-12-27 DIAGNOSIS — R42 Dizziness and giddiness: Secondary | ICD-10-CM | POA: Insufficient documentation

## 2013-12-27 DIAGNOSIS — Z79899 Other long term (current) drug therapy: Secondary | ICD-10-CM | POA: Insufficient documentation

## 2013-12-27 DIAGNOSIS — Z862 Personal history of diseases of the blood and blood-forming organs and certain disorders involving the immune mechanism: Secondary | ICD-10-CM | POA: Insufficient documentation

## 2013-12-27 DIAGNOSIS — R51 Headache: Secondary | ICD-10-CM | POA: Insufficient documentation

## 2013-12-27 DIAGNOSIS — H9209 Otalgia, unspecified ear: Secondary | ICD-10-CM | POA: Insufficient documentation

## 2013-12-27 DIAGNOSIS — Z8679 Personal history of other diseases of the circulatory system: Secondary | ICD-10-CM | POA: Insufficient documentation

## 2013-12-27 DIAGNOSIS — R519 Headache, unspecified: Secondary | ICD-10-CM

## 2013-12-27 DIAGNOSIS — Z90710 Acquired absence of both cervix and uterus: Secondary | ICD-10-CM | POA: Insufficient documentation

## 2013-12-27 LAB — COMPREHENSIVE METABOLIC PANEL
ALBUMIN: 4.2 g/dL (ref 3.5–5.2)
ALK PHOS: 85 U/L (ref 39–117)
ALT: 18 U/L (ref 0–35)
AST: 16 U/L (ref 0–37)
BUN: 8 mg/dL (ref 6–23)
CHLORIDE: 102 meq/L (ref 96–112)
CO2: 22 mEq/L (ref 19–32)
Calcium: 9 mg/dL (ref 8.4–10.5)
Creatinine, Ser: 0.75 mg/dL (ref 0.50–1.10)
GFR calc Af Amer: >90 mL/min (ref >90)
GFR calc non Af Amer: >90 mL/min (ref >90)
Glucose, Bld: 124 mg/dL — ABNORMAL HIGH (ref 70–99)
POTASSIUM: 3.6 meq/L — AB (ref 3.7–5.3)
SODIUM: 140 meq/L (ref 137–147)
TOTAL PROTEIN: 8.2 g/dL (ref 6.0–8.3)
Total Bilirubin: 0.2 mg/dL — ABNORMAL LOW (ref 0.3–1.2)

## 2013-12-27 LAB — CBC WITH DIFFERENTIAL/PLATELET
BASOS ABS: 0 10*3/uL (ref 0.0–0.1)
BASOS PCT: 0 % (ref 0–1)
EOS ABS: 0.1 10*3/uL (ref 0.0–0.7)
Eosinophils Relative: 1 % (ref 0–5)
HCT: 42.3 % (ref 36.0–46.0)
Hemoglobin: 14.7 g/dL (ref 12.0–15.0)
Lymphocytes Relative: 42 % (ref 12–46)
Lymphs Abs: 3.4 10*3/uL (ref 0.7–4.0)
MCH: 30 pg (ref 26.0–34.0)
MCHC: 34.8 g/dL (ref 30.0–36.0)
MCV: 86.3 fL (ref 78.0–100.0)
Monocytes Absolute: 0.4 10*3/uL (ref 0.1–1.0)
Monocytes Relative: 5 % (ref 3–12)
NEUTROS ABS: 4.2 10*3/uL (ref 1.7–7.7)
NEUTROS PCT: 52 % (ref 43–77)
PLATELETS: 317 10*3/uL (ref 150–400)
RBC: 4.9 MIL/uL (ref 3.87–5.11)
RDW: 13.8 % (ref 11.5–15.5)
WBC: 8.1 10*3/uL (ref 4.0–10.5)

## 2013-12-27 MED ORDER — SODIUM CHLORIDE 0.9 % IV SOLN
1000.0000 mL | INTRAVENOUS | Status: DC
Start: 2013-12-27 — End: 2013-12-28
  Administered 2013-12-28: 1000 mL via INTRAVENOUS

## 2013-12-27 MED ORDER — SODIUM CHLORIDE 0.9 % IV SOLN
1000.0000 mL | Freq: Once | INTRAVENOUS | Status: AC
  Administered 2013-12-28: 1000 mL via INTRAVENOUS

## 2013-12-27 MED ORDER — METOCLOPRAMIDE HCL 5 MG/ML IJ SOLN
10.0000 mg | Freq: Once | INTRAMUSCULAR | Status: AC
  Administered 2013-12-28: 10 mg via INTRAVENOUS
  Filled 2013-12-27: qty 2

## 2013-12-27 MED ORDER — DIPHENHYDRAMINE HCL 50 MG/ML IJ SOLN
25.0000 mg | Freq: Once | INTRAMUSCULAR | Status: AC
  Administered 2013-12-28: 25 mg via INTRAVENOUS
  Filled 2013-12-27: qty 1

## 2013-12-27 NOTE — ED Provider Notes (Signed)
CSN: [id number]     Arrival date & time 12/27/13  1952 History   First MD Initiated Contact with Patient 12/27/13 2323     Chief Complaint  Patient presents with  . Headache  . Dizziness  . Otalgia   (Consider location/radiation/quality/duration/timing/severity/associated sxs/prior Treatment) Patient is a 43 y.o. female presenting with headaches, dizziness, and ear pain. The history is provided by the patient.  Headache Associated symptoms: dizziness and ear pain   Dizziness Associated symptoms: headaches   Otalgia Associated symptoms: headaches   She had onset about 6 PM of a left frontotemporal headache with some radiation to the left ear. Pain was dull with some mild pressure feeling. He was moderate to severe and rated at 9/10. Pain has subsided slightly and is now 7/10. Nothing makes it better nothing makes it worse. Specifically, there is no photophobia or phonophobia. She denies visual change. There's been some nausea but no vomiting. She's also feeling dizzy and generally weak. She has had migraine headaches in the past and they've felt similar to this only worse. She did try taking some Goody Powder with no benefit.  Past Medical History  Diagnosis Date  . Allergy   . Anemia   . Wears glasses   . Migraine   . S/P hysterectomy     hx/o fibroids  . Blood transfusion without reported diagnosis     during hysterectomy   History reviewed. No pertinent past surgical history. No family history on file. History  Substance Use Topics  . Smoking status: Never Smoker   . Smokeless tobacco: Not on file  . Alcohol Use: No   OB History   Grav Para Term Preterm Abortions TAB SAB Ect Mult Living                 Review of Systems  HENT: Positive for ear pain.   Neurological: Positive for dizziness and headaches.  All other systems reviewed and are negative.    Allergies  Review of patient's allergies indicates no known allergies.  Home Medications   Current Outpatient  Rx  Name  Route  Sig  Dispense  Refill  . Aspirin-Acetaminophen (GOODYS BODY PAIN PO)   Oral   Take 1 packet by mouth daily as needed (pain).         . Azelastine-Fluticasone (DYMISTA) 137-50 MCG/ACT SUSP   Nasal   Place 1 spray into the nose 2 (two) times daily.   1 Bottle   0    BP 140/85  Pulse 68  Temp(Src) 97.8 F (36.6 C) (Oral)  Resp 16  Ht 5\' 9"  (1.753 m)  Wt 222 lb (100.699 kg)  BMI 32.77 kg/m2  SpO2 100% Physical Exam  Nursing note and vitals reviewed.  43 year old female, resting comfortably and in no acute distress. Vital signs are normal. Oxygen saturation is 100%, which is normal. Head is normocephalic and atraumatic. PERRLA, EOMI. Oropharynx is clear. Fundi show no hemorrhage, exudate, or papilledema. There is no tenderness palpation over the frontalis, temporalis, or paracervical muscles. Neck is nontender and supple without adenopathy or JVD. Back is nontender and there is no CVA tenderness. Lungs are clear without rales, wheezes, or rhonchi. Chest is nontender. Heart has regular rate and rhythm without murmur. Abdomen is soft, flat, nontender without masses or hepatosplenomegaly and peristalsis is normoactive. Extremities have no cyanosis or edema, full range of motion is present. Skin is warm and dry without rash. Neurologic: Mental status is normal, cranial nerves are intact, there  are no motor or sensory deficits.  ED Course  Procedures (including critical care time) Labs Review Results for orders placed during the hospital encounter of 12/27/13  CBC WITH DIFFERENTIAL      Result Value Range   WBC 8.1  4.0 - 10.5 K/uL   RBC 4.90  3.87 - 5.11 MIL/uL   Hemoglobin 14.7  12.0 - 15.0 g/dL   HCT 42.3  36.0 - 46.0 %   MCV 86.3  78.0 - 100.0 fL   MCH 30.0  26.0 - 34.0 pg   MCHC 34.8  30.0 - 36.0 g/dL   RDW 13.8  11.5 - 15.5 %   Platelets 317  150 - 400 K/uL   Neutrophils Relative % 52  43 - 77 %   Neutro Abs 4.2  1.7 - 7.7 K/uL   Lymphocytes  Relative 42  12 - 46 %   Lymphs Abs 3.4  0.7 - 4.0 K/uL   Monocytes Relative 5  3 - 12 %   Monocytes Absolute 0.4  0.1 - 1.0 K/uL   Eosinophils Relative 1  0 - 5 %   Eosinophils Absolute 0.1  0.0 - 0.7 K/uL   Basophils Relative 0  0 - 1 %   Basophils Absolute 0.0  0.0 - 0.1 K/uL  COMPREHENSIVE METABOLIC PANEL      Result Value Range   Sodium 140  137 - 147 mEq/L   Potassium 3.6 (*) 3.7 - 5.3 mEq/L   Chloride 102  96 - 112 mEq/L   CO2 22  19 - 32 mEq/L   Glucose, Bld 124 (*) 70 - 99 mg/dL   BUN 8  6 - 23 mg/dL   Creatinine, Ser 0.75  0.50 - 1.10 mg/dL   Calcium 9.0  8.4 - 10.5 mg/dL   Total Protein 8.2  6.0 - 8.3 g/dL   Albumin 4.2  3.5 - 5.2 g/dL   AST 16  0 - 37 U/L   ALT 18  0 - 35 U/L   Alkaline Phosphatase 85  39 - 117 U/L   Total Bilirubin 0.2 (*) 0.3 - 1.2 mg/dL   GFR calc non Af Amer >90  >90 mL/min   GFR calc Af Amer >90  >90 mL/min   MDM   1. Headache    Headache of uncertain cause. No red flags to suggest more serious pathology. Initial laboratory workup is unremarkable. She will be given a migraine cocktail and reassessed.  She feels much better following migraine cocktail. Symptoms are completely gone. She is discharged.  Delora Fuel, MD 97/94/80 1655

## 2013-12-27 NOTE — ED Notes (Signed)
Pt. reports headache , lightheaded , dizziness and left ear ache onset today . Alert and oriented / respirations unlabored .

## 2013-12-28 NOTE — Discharge Instructions (Signed)
Migraine Headache A migraine headache is an intense, throbbing pain on one or both sides of your head. A migraine can last for 30 minutes to several hours. CAUSES  The exact cause of a migraine headache is not always known. However, a migraine may be caused when nerves in the brain become irritated and release chemicals that cause inflammation. This causes pain. Certain things may also trigger migraines, such as:  Alcohol.  Smoking.  Stress.  Menstruation.  Aged cheeses.  Foods or drinks that contain nitrates, glutamate, aspartame, or tyramine.  Lack of sleep.  Chocolate.  Caffeine.  Hunger.  Physical exertion.  Fatigue.  Medicines used to treat chest pain (nitroglycerine), birth control pills, estrogen, and some blood pressure medicines. SIGNS AND SYMPTOMS  Pain on one or both sides of your head.  Pulsating or throbbing pain.  Severe pain that prevents daily activities.  Pain that is aggravated by any physical activity.  Nausea, vomiting, or both.  Dizziness.  Pain with exposure to bright lights, loud noises, or activity.  General sensitivity to bright lights, loud noises, or smells. Before you get a migraine, you may get warning signs that a migraine is coming (aura). An aura may include:  Seeing flashing lights.  Seeing bright spots, halos, or zig-zag lines.  Having tunnel vision or blurred vision.  Having feelings of numbness or tingling.  Having trouble talking.  Having muscle weakness. DIAGNOSIS  A migraine headache is often diagnosed based on:  Symptoms.  Physical exam.  A CT scan or MRI of your head. These imaging tests cannot diagnose migraines, but they can help rule out other causes of headaches. TREATMENT Medicines may be given for pain and nausea. Medicines can also be given to help prevent recurrent migraines.  HOME CARE INSTRUCTIONS  Only take over-the-counter or prescription medicines for pain or discomfort as directed by your  health care provider. The use of long-term narcotics is not recommended.  Lie down in a dark, quiet room when you have a migraine.  Keep a journal to find out what may trigger your migraine headaches. For example, write down:  What you eat and drink.  How much sleep you get.  Any change to your diet or medicines.  Limit alcohol consumption.  Quit smoking if you smoke.  Get 7 9 hours of sleep, or as recommended by your health care provider.  Limit stress.  Keep lights dim if bright lights bother you and make your migraines worse. SEEK IMMEDIATE MEDICAL CARE IF:   Your migraine becomes severe.  You have a fever.  You have a stiff neck.  You have vision loss.  You have muscular weakness or loss of muscle control.  You start losing your balance or have trouble walking.  You feel faint or pass out.  You have severe symptoms that are different from your first symptoms. MAKE SURE YOU:   Understand these instructions.  Will watch your condition.  Will get help right away if you are not doing well or get worse. Document Released: 11/17/2005 Document Revised: 09/07/2013 Document Reviewed: 07/25/2013 [employer] Patient Information 2014 [employer].  

## 2014-01-10 ENCOUNTER — Encounter: Payer: Self-pay | Admitting: Medical

## 2014-01-10 ENCOUNTER — Ambulatory Visit (INDEPENDENT_AMBULATORY_CARE_PROVIDER_SITE_OTHER): Payer: 59 | Admitting: Medical

## 2014-01-10 VITALS — BP 140/90 | HR 76 | Temp 98.4°F | Resp 16 | Ht 69.0 in | Wt 219.0 lb

## 2014-01-10 DIAGNOSIS — Z0000 Encounter for general adult medical examination without abnormal findings: Secondary | ICD-10-CM

## 2014-01-10 DIAGNOSIS — K036 Deposits [accretions] on teeth: Secondary | ICD-10-CM

## 2014-01-10 DIAGNOSIS — L909 Atrophic disorder of skin, unspecified: Secondary | ICD-10-CM

## 2014-01-10 DIAGNOSIS — R5381 Other malaise: Secondary | ICD-10-CM

## 2014-01-10 DIAGNOSIS — R110 Nausea: Secondary | ICD-10-CM

## 2014-01-10 DIAGNOSIS — R030 Elevated blood-pressure reading, without diagnosis of hypertension: Secondary | ICD-10-CM

## 2014-01-10 DIAGNOSIS — G43909 Migraine, unspecified, not intractable, without status migrainosus: Secondary | ICD-10-CM

## 2014-01-10 DIAGNOSIS — R5383 Other fatigue: Secondary | ICD-10-CM

## 2014-01-10 DIAGNOSIS — E669 Obesity, unspecified: Secondary | ICD-10-CM

## 2014-01-10 DIAGNOSIS — R42 Dizziness and giddiness: Secondary | ICD-10-CM

## 2014-01-10 DIAGNOSIS — Z23 Encounter for immunization: Secondary | ICD-10-CM

## 2014-01-10 DIAGNOSIS — L918 Other hypertrophic disorders of the skin: Secondary | ICD-10-CM

## 2014-01-10 DIAGNOSIS — L919 Hypertrophic disorder of the skin, unspecified: Secondary | ICD-10-CM

## 2014-01-10 LAB — POCT URINALYSIS DIPSTICK
Bilirubin, UA: NEGATIVE
GLUCOSE UA: NEGATIVE
Ketones, UA: NEGATIVE
Leukocytes, UA: NEGATIVE
Nitrite, UA: NEGATIVE
PH UA: 5
Protein, UA: NEGATIVE
RBC UA: NEGATIVE
SPEC GRAV UA: 1.015
UROBILINOGEN UA: NEGATIVE

## 2014-01-10 LAB — HEMOGLOBIN A1C
HEMOGLOBIN A1C: 6.1 % — AB (ref <5.7)
Mean Plasma Glucose: 128 mg/dL — ABNORMAL HIGH (ref <117)

## 2014-01-10 MED ORDER — BUTALBITAL-APAP-CAFFEINE 50-325-40 MG PO TABS
1.0000 | ORAL_TABLET | Freq: Four times a day (QID) | ORAL | Status: AC | PRN
Start: 2014-01-10 — End: 2015-01-10

## 2014-01-10 MED ORDER — CETIRIZINE HCL 10 MG PO TABS: 10.0000 mg | ORAL_TABLET | Freq: Every day | ORAL | Status: DC

## 2014-01-10 NOTE — Progress Notes (Signed)
Subjective:   HPI  Tara Rodriguez is a 43 y.o. female who presents for a complete physical.  Preventative care: Last ophthalmology visit: within a year Last dental visit: 4-5 years Last colonoscopy: a few years ago for anemia Last mammogram: never had one Last gynecological exam: 10 years ago Last EKG: n/a Last labs: hospital  Prior vaccinations: TD or Tdap: dont remember Influenza: not last year Pneumococcal: Shingles/Zostavax:  Advanced directive: Health care power of attorney: no Living will: no  Concerns: Had migraine 2 wk ago, went to the hospital.  Been getting dizzy, lightheaded, sick to stomach, very exhausted.  If she doesn't go and lie down, feels like she will pass out.  Was at work when migraine came on.  Does CNA work.    Reviewed their medical, surgical, family, social, medication, and allergy history and updated chart as appropriate.   Review of Systems Constitutional: -fever, +chills, -sweats, -unexpected weight change, -decreased appetite, + severe fatigue Allergy: -sneezing, -itching, +congestion Dermatology: -changing moles, --rash, -lumps ENT: -runny nose, -ear pain,+-sore throat, -hoarseness, -sinus pain, -teeth pain, - ringing in ears, -hearing loss, -nosebleeds Cardiology: -chest pain, -palpitations, -swelling, -difficulty breathing when lying flat, -waking up short of breath Respiratory: -cough, -shortness of breath, -difficulty breathing with exercise or exertion, -wheezing, -coughing up blood Gastroenterology: +abdominal pain(upper), +nausea, -vomiting, -diarrhea, -constipation, -blood in stool, -changes in bowel movement, -difficulty swallowing or eating Hematology: -bleeding, -bruising  Musculoskeletal: -joint aches, -muscle aches, -joint swelling, -back pain, -neck pain, -cramping, -changes in gait Ophthalmology: denies vision changes, eye redness, itching, discharge Urology: -burning with urination, -difficulty urinating, -blood in urine, -urinary  frequency, -urgency, -incontinence Neurology: +headache, +weakness, -tingling, -numbness, -memory loss, -falls, +dizziness Psychology: -depressed mood, -agitation, -sleep problems     Objective:   Physical Exam  BP 140/90  Pulse 76  Temp(Src) 98.4 F (36.9 C) (Oral)  Resp 16  Ht 5\' 9"  (1.753 m)  Wt 219 lb (99.338 kg)  BMI 32.33 kg/m2  General appearance: alert, no distress, WD/WN, AA female Skin: numerous skin tags around neck, in bilat axilla and inferiomedial to bilat breasts HEENT: normocephalic, conjunctiva/corneas normal, sclerae anicteric, PERRLA, EOMi, nares patent, no discharge or erythema, pharynx normal Oral cavity: MMM, tongue normal, teeth -modeate lower teeth plaque Neck: supple, no lymphadenopathy, no thyromegaly, no masses, normal ROM, no bruits Chest: non tender, normal shape and expansion Heart: RRR, normal S1, S2, no murmurs Lungs: CTA bilaterally, no wheezes, rhonchi, or rales Abdomen: +bs, soft, non tender, non distended, no masses, no hepatomegaly, no splenomegaly, no bruits Back: non tender, normal ROM, no scoliosis Musculoskeletal: upper extremities non tender, no obvious deformity, normal ROM throughout, lower extremities non tender, no obvious deformity, normal ROM throughout Extremities: no edema, no cyanosis, no clubbing Pulses: 2+ symmetric, upper and lower extremities, normal cap refill Neurological: alert, oriented x 3, CN2-12 intact, strength normal upper extremities and lower extremities, sensation normal throughout, DTRs 2+ throughout, no cerebellar signs, gait normal Psychiatric: normal affect, behavior normal, pleasant  Breast: nontender, no masses or lumps, no skin changes, no nipple discharge or inversion, no axillary lymphadenopathy Gyn: Normal external genitalia without lesions, vagina with normal mucosa, s/p hysterecomty ,no abnormal vaginal discharge.   adnexa not enlarged, nontender, no masses.  Pap performed.  Exam chaperoned by  nurse. Rectal: anus normal appearing    Assessment and Plan :    Encounter Diagnoses  Name Primary?  . Routine general medical examination at a health care facility Yes  . Need for Tdap vaccination   .  Obesity, unspecified   . Migraine   . Nausea   . Dizziness and giddiness   . Other malaise and fatigue   . Dental plaque   . Elevated blood pressure reading without diagnosis of hypertension   . Skin tag      1. Routine general medical examination at a health care facility Physical exam - discussed healthy lifestyle, diet, exercise, preventative care, vaccinations, and addressed their concerns.  Handout given.   Referral for first screening mammogram.  Pap sent.    - Lipid panel - TSH - Hemoglobin A1c - Urinalysis Dipstick  2. Need for Tdap vaccination Counseled on the Tdap (tetanus, diptheria, and acellular pertussis) vaccine.  Vaccine information sheet given. Tdap vaccine given after consent obtained.  3. Obesity, unspecified - TSH - Hemoglobin A1c  4. Migraine Fioricet prn, keep headache diary, f/u pending labs.  5. Nausea pending labs  6. Dizziness and giddiness Discussed possible diagnosis.  F/u pending labs.   7. Other malaise and fatigue - TSH  8. Dental plaque Advised she see dentist soon  9. Elevated blood pressure reading without diagnosis of hypertension Recheck pending labs, f/u 53mo  10. Skin tag Return at her convenience for excision of numerous skin tags  Return pending studies.

## 2014-01-10 NOTE — Patient Instructions (Addendum)
Encounter Diagnoses  Name Primary?  . Routine general medical examination at a health care facility Yes  . Obesity, unspecified   . Migraine   . Nausea   . Dizziness and giddiness   . Other malaise and fatigue   . Need for Tdap vaccination    See dentist for routine followup in hygiene visit for plaque  See your eye doctor yearly  We updated your Tdap vaccination today and this is good for 10 years  We will set you up for your first screening mammogram  Begin Zyrtec 10 mg at nighttime for allergies.  Start keeping a headache diary.  If you get a migraine, you can use Fioricet prescribed today.  We will call with lab results and plan.  Return at your convenience  for procedure visit for skin tag removal.

## 2014-01-11 ENCOUNTER — Other Ambulatory Visit (HOSPITAL_COMMUNITY)
Admission: RE | Admit: 2014-01-11 | Discharge: 2014-01-11 | Disposition: A | Discharge status: Home / Self Care | Payer: 59 | Source: Ambulatory Visit | Attending: Gynecology | Admitting: Gynecology

## 2014-01-11 ENCOUNTER — Other Ambulatory Visit: Payer: Self-pay | Admitting: Family Medicine

## 2014-01-11 ENCOUNTER — Other Ambulatory Visit: Payer: Self-pay | Admitting: Medical

## 2014-01-11 DIAGNOSIS — Z1231 Encounter for screening mammogram for malignant neoplasm of breast: Secondary | ICD-10-CM

## 2014-01-11 DIAGNOSIS — Z01419 Encounter for gynecological examination (general) (routine) without abnormal findings: Secondary | ICD-10-CM | POA: Insufficient documentation

## 2014-01-11 LAB — LIPID PANEL
CHOL/HDL RATIO: 3.1 ratio
CHOLESTEROL: 114 mg/dL (ref 0–200)
HDL: 37 mg/dL — ABNORMAL LOW (ref >39)
LDL CALC: 64 mg/dL (ref 0–99)
Triglycerides: 63 mg/dL (ref <150)
VLDL: 13 mg/dL (ref 0–40)

## 2014-01-11 LAB — TSH: TSH: 2.629 u[IU]/mL (ref 0.350–4.500)

## 2014-01-11 MED ORDER — MECLIZINE HCL 25 MG PO TABS: 25.0000 mg | ORAL_TABLET | Freq: Two times a day (BID) | ORAL | Status: DC

## 2014-01-11 NOTE — Addendum Note (Signed)
Addended by: Carlena Hurl on: 01/11/2014 01:33 PM   Modules accepted: Orders

## 2014-01-13 ENCOUNTER — Encounter: Payer: Self-pay | Admitting: Family Medicine

## 2014-02-23 ENCOUNTER — Ambulatory Visit: Payer: 59

## 2014-05-03 ENCOUNTER — Encounter: Payer: Self-pay | Admitting: Medical

## 2014-05-03 ENCOUNTER — Ambulatory Visit (INDEPENDENT_AMBULATORY_CARE_PROVIDER_SITE_OTHER): Payer: 59 | Admitting: Medical

## 2014-05-03 VITALS — BP 132/80 | HR 78 | Temp 98.4°F | Resp 16 | Wt 220.0 lb

## 2014-05-03 DIAGNOSIS — M436 Torticollis: Secondary | ICD-10-CM

## 2014-05-03 MED ORDER — NAPROXEN 375 MG PO TABS: 375.0000 mg | ORAL_TABLET | Freq: Two times a day (BID) | ORAL | Status: DC

## 2014-05-03 MED ORDER — METHOCARBAMOL 500 MG PO TABS: 500.0000 mg | ORAL_TABLET | Freq: Three times a day (TID) | ORAL | Status: DC

## 2014-05-03 NOTE — Patient Instructions (Addendum)
  Thank you for giving me the opportunity to serve you today.    Your diagnosis today includes: Encounter Diagnosis  Name Primary?  . Torticollis Yes     Specific recommendations today include:  Begin gentle neck, shoulder and back stretching  Begin Naprosyn twice daily for pain and inflammation  Use heat to the neck  Use Robaxin muscle relaxer, 1 tablet up to 3 times daily if needed, but caution - may make you sleepy  Consider massage tomorrow  Call or return if not improving by Friday   I have included other useful information below for your review.  Massage Therapy:  Ernestene Mention [facility] [address] [place], [place] [address] [phone] [email or web address]   [place] [address] [place], [place] [address] [phone]

## 2014-05-03 NOTE — Progress Notes (Signed)
Subjective: Here for neck pain x 1 day.    She notes that she awoke yesterday morning felt like she may have slept wrong on her neck and now can't move her neck.  Denies specific injury trauma or fall, but she does work as a [employer]ar[employer] manager.  He denies weakness, numbness, tingling, no arm pain other than some left shoulder pain radiating from the neck.  Back pain no chest pain no fever doesn't feel ill.  No headache. She has had similar in the past a few times but not quite this bad. Not taking anything for this. No other relieving or aggravating factors. No other complaint.  Review of systems as in subjective  Objective: General: Well-developed, well-nourished, no acute distress Skin: Warm, no erythema or ecchymosis Neck: Stiff in general, tender lateral neck muscles as well as posterior lateral right neck, decreased range of motion in all directions, no mass, no lymphadenopathy Back: Mild left upper back tenderness paraspinal, otherwise nontender MSK: Arms nontender, normal range of motion, no deformity Arms neurovascularly intact, neck muscles intact normal strength  Assessment: Encounter Diagnosis  Name Primary?  . Torticollis Yes    Plan: Discussed symptoms and exam findings, diagnosis and treatment for Torticollis.  NSAIDs and muscle relaxers per orders below, discussed heat, massage, gentle range of motion, and advised that this will resolve in the next several days.  Call return if not much improved by the end of the week, note given for work for today and tomorrow

## 2014-09-20 ENCOUNTER — Ambulatory Visit: Payer: 59 | Admitting: Medical

## 2014-09-21 ENCOUNTER — Encounter: Payer: Self-pay | Admitting: Medical

## 2014-09-21 ENCOUNTER — Telehealth: Payer: Self-pay | Admitting: Medical

## 2014-09-21 ENCOUNTER — Ambulatory Visit (INDEPENDENT_AMBULATORY_CARE_PROVIDER_SITE_OTHER): Payer: 59 | Admitting: Medical

## 2014-09-21 VITALS — BP 120/80 | HR 78 | Temp 98.0°F | Resp 16 | Wt 212.0 lb

## 2014-09-21 DIAGNOSIS — Z90710 Acquired absence of both cervix and uterus: Secondary | ICD-10-CM

## 2014-09-21 DIAGNOSIS — R102 Pelvic and perineal pain: Secondary | ICD-10-CM

## 2014-09-21 DIAGNOSIS — K5900 Constipation, unspecified: Secondary | ICD-10-CM

## 2014-09-21 NOTE — Progress Notes (Signed)
   Subjective:   Tara Rodriguez is a 43 y.o. female presenting on 09/21/2014 with stomach pain  Here for history of dull vaginal and lower bowel pain for the last year or so intermittent. Hasn't discussed this with anybody until now. 2 nights ago had sharp pain in the same area. Taking Goody powder and it resolved. The next morning had similar pain, pain worsened throughout the day she left work due to the pain. She is over-the-counter analgesic.  Pain is stabbing lower abdomen/pelvis, suprapubic area. She does have a history of urinary tract infection but this seems different than that. Currently the pain is resolved.  Denies vaginal discharge, odor.  She is sexually active, no pain with intercourse last intercourse was a month ago.  She has a partner, last STD screening many years ago.  No urinary complaint , no burning no frequency no odor no blood in urine.  She has constipation that started a month ago, currently one bowel movement daily not having to strain. Denies diarrhea, no blood in the stool, no nausea vomiting.  She does report a history of hysterectomy and subsequent per rectally bilateral although she told me prior just hysterectomy.  No other aggravating or relieving factors.  No other complaint.  Review of Systems ROS as in subjective      Objective:    Filed Vitals:   09/21/14 0911  BP: 120/80  Pulse: 78  Temp: 98 F (36.7 C)  Resp: 16    General appearance: alert, no distress, WD/WN Abdomen: +bs, soft, Midline lower abdominal surgical scar vertical, non tender, non distended, no masses, no hepatomegaly, no splenomegaly Back: nontender Gyn: Normal external genitalia without lesions, vagina with normal mucosa, s/p hysterectomy, no abnormal vaginal discharge. Adnexa not enlarged, nontender, no masses.  Swabs taken.  Exam chaperoned by nurse. Rectal: anus normal appearing Pulses: 2+ symmetric, upper and lower extremities, normal cap refill Ext: no edema        Assessment: Encounter Diagnoses  Name Primary?  . Pelvic pain in female Yes  . S/P hysterectomy   . Constipation, unspecified constipation type      Plan: Etiology unclear, discussed wide differential.   Swabs today, consider pelvic ultrasound vs CT pelvis.   Will try and track down prior operative report from hysterectomy 6 years ago.   If oophorectomy was done, then consider pelvic CT.   Discuss ways to deal with constipation, fiber, water, OTC remedies.  F/u pending labs.    [name] was seen today for stomach pain.  Diagnoses and associated orders for this visit:  Pelvic pain in female - GC/Chlamydia Probe Amp - POCT Wet Prep [facility]) - POCT urinalysis dipstick  S/P hysterectomy  Constipation, unspecified constipation type   f/u pending labs

## 2014-09-21 NOTE — Telephone Encounter (Signed)
pls check on copy of prior hysterectomy surgery report for 6 years ago

## 2014-09-21 NOTE — Telephone Encounter (Signed)
She has signed a release form and i have faxed it over. Nothing in her chart

## 2014-09-21 NOTE — Telephone Encounter (Signed)
Faxed form over to [facility] hospital

## 2014-09-22 LAB — GC/CHLAMYDIA PROBE AMP
CT Probe RNA: NEGATIVE
GC PROBE AMP APTIMA: NEGATIVE

## 2014-09-29 ENCOUNTER — Encounter: Payer: Self-pay | Admitting: Family Medicine

## 2015-02-02 ENCOUNTER — Encounter (HOSPITAL_COMMUNITY): Payer: Self-pay | Admitting: Emergency Medicine

## 2015-02-02 ENCOUNTER — Emergency Department (HOSPITAL_COMMUNITY)
Admission: EM | Admit: 2015-02-02 | Discharge: 2015-02-03 | Disposition: A | Discharge status: Home / Self Care | Payer: 59 | Attending: Emergency Medicine | Admitting: Emergency Medicine

## 2015-02-02 DIAGNOSIS — R112 Nausea with vomiting, unspecified: Secondary | ICD-10-CM | POA: Insufficient documentation

## 2015-02-02 DIAGNOSIS — R51 Headache: Secondary | ICD-10-CM | POA: Insufficient documentation

## 2015-02-02 DIAGNOSIS — R6883 Chills (without fever): Secondary | ICD-10-CM | POA: Insufficient documentation

## 2015-02-02 DIAGNOSIS — Z862 Personal history of diseases of the blood and blood-forming organs and certain disorders involving the immune mechanism: Secondary | ICD-10-CM | POA: Diagnosis not present

## 2015-02-02 DIAGNOSIS — Z8679 Personal history of other diseases of the circulatory system: Secondary | ICD-10-CM | POA: Insufficient documentation

## 2015-02-02 DIAGNOSIS — Z973 Presence of spectacles and contact lenses: Secondary | ICD-10-CM | POA: Insufficient documentation

## 2015-02-02 DIAGNOSIS — R109 Unspecified abdominal pain: Secondary | ICD-10-CM | POA: Diagnosis not present

## 2015-02-02 LAB — CBC
HCT: 42.4 % (ref 36.0–46.0)
Hemoglobin: 14.7 g/dL (ref 12.0–15.0)
MCH: 29.2 pg (ref 26.0–34.0)
MCHC: 34.7 g/dL (ref 30.0–36.0)
MCV: 84.3 fL (ref 78.0–100.0)
PLATELETS: 284 10*3/uL (ref 150–400)
RBC: 5.03 MIL/uL (ref 3.87–5.11)
RDW: 13.7 % (ref 11.5–15.5)
WBC: 8.2 10*3/uL (ref 4.0–10.5)

## 2015-02-02 LAB — URINALYSIS, ROUTINE W REFLEX MICROSCOPIC
Glucose, UA: NEGATIVE mg/dL
HGB URINE DIPSTICK: NEGATIVE
Ketones, ur: NEGATIVE mg/dL
Leukocytes, UA: NEGATIVE
NITRITE: NEGATIVE
PH: 6 (ref 5.0–8.0)
Protein, ur: NEGATIVE mg/dL
SPECIFIC GRAVITY, URINE: 1.034 — AB (ref 1.005–1.030)
Urobilinogen, UA: 1 mg/dL (ref 0.0–1.0)

## 2015-02-02 LAB — BASIC METABOLIC PANEL WITH GFR
Anion gap: 8 (ref 5–15)
BUN: 8 mg/dL (ref 6–23)
CO2: 26 mmol/L (ref 19–32)
Calcium: 8.5 mg/dL (ref 8.4–10.5)
Chloride: 105 mmol/L (ref 96–112)
Creatinine, Ser: 0.71 mg/dL (ref 0.50–1.10)
GFR calc Af Amer: >90 mL/min
GFR calc non Af Amer: >90 mL/min
Glucose, Bld: 124 mg/dL — ABNORMAL HIGH (ref 70–99)
Potassium: 3.3 mmol/L — ABNORMAL LOW (ref 3.5–5.1)
Sodium: 139 mmol/L (ref 135–145)

## 2015-02-02 MED ORDER — ONDANSETRON 4 MG PO TBDP
ORAL_TABLET | ORAL | Status: AC
  Filled 2015-02-02: qty 1

## 2015-02-02 MED ORDER — ACETAMINOPHEN 325 MG PO TABS
325.0000 mg | ORAL_TABLET | Freq: Once | ORAL | Status: AC
  Administered 2015-02-02: 325 mg via ORAL
  Filled 2015-02-02: qty 1

## 2015-02-02 MED ORDER — ONDANSETRON 4 MG PO TBDP
4.0000 mg | ORAL_TABLET | Freq: Once | ORAL | Status: AC
  Administered 2015-02-02: 4 mg via ORAL

## 2015-02-02 NOTE — ED Notes (Signed)
Pt passed PO challenge.

## 2015-02-02 NOTE — ED Provider Notes (Signed)
CSN: [id number]     Arrival date & time 02/02/15  1804 History   First MD Initiated Contact with Patient 02/02/15 2108     Chief Complaint  Patient presents with  . Emesis     (Consider location/radiation/quality/duration/timing/severity/associated sxs/prior Treatment) HPI Comments: Patient is a 44 yo F presenting to the ED for evaluation of acute onset nausea and vomiting since this morning at 4AM. Patient endorses some abdominal soreness associated after emesis. She states her last episode of emesis was in the waiting room. She denies any bloody or bilious appearance or emesis. Denies any diarrhea. She does endorse associated generalized body aches and a generalized headache both of which are better now. She does endorse that her family member with similar symptoms on Wednesday. Abdominal surgical history includes abdominal hysterectomy.   Past Medical History  Diagnosis Date  . Allergy   . Wears glasses   . Migraine   . S/P hysterectomy     hx/o fibroids  . Blood transfusion without reported diagnosis     during hysterectomy  . Anemia     history of   Past Surgical History  Procedure Laterality Date  . Colonoscopy  2009    due to anemia  . Abdominal hysterectomy  2009    total, due to fibroids   Family History  Problem Relation Age of Onset  . Heart disease Mother     pacemaker  . Alcohol abuse Father   . Cancer Maternal Grandmother     breast  . Stroke Maternal Grandfather   . Cancer Paternal Grandmother     colon  . Diabetes Neg Hx   . Hypertension Neg Hx   . Hyperlipidemia Neg Hx    History  Substance Use Topics  . Smoking status: Never Smoker   . Smokeless tobacco: Not on file  . Alcohol Use: No   OB History    No data available     Review of Systems  Constitutional: Positive for chills. Negative for fever.  Gastrointestinal: Positive for nausea, vomiting and abdominal pain. Negative for diarrhea.  Musculoskeletal: Positive for myalgias.  All other  systems reviewed and are negative.     Allergies  Review of patient's allergies indicates no known allergies.  Home Medications   Prior to Admission medications   Medication Sig Start Date End Date Taking? Authorizing Provider  Aspirin-Acetaminophen (GOODYS BODY PAIN PO) Take 1 packet by mouth daily as needed (pain).   Yes Historical Provider, MD  cetirizine (ZYRTEC) 10 MG tablet Take 1 tablet (10 mg total) by mouth at bedtime. Patient not taking: Reported on 02/02/2015 01/10/14   Camelia Eng Tysinger, PA-C  methocarbamol (ROBAXIN) 500 MG tablet Take 1 tablet (500 mg total) by mouth 3 (three) times daily. Patient not taking: Reported on 02/02/2015 05/03/14   Camelia Eng Tysinger, PA-C  naproxen (NAPROSYN) 375 MG tablet Take 1 tablet (375 mg total) by mouth 2 (two) times daily with a meal. Patient not taking: Reported on 02/02/2015 05/03/14   Camelia Eng Tysinger, PA-C  ondansetron (ZOFRAN ODT) 4 MG disintegrating tablet Take 1 tablet (4 mg total) by mouth every 8 (eight) hours as needed for nausea or vomiting. 02/03/15   Shearon Clonch L Sailor Hevia, PA-C   BP 134/69 mmHg  Pulse 81  Temp(Src) 99.7 F (37.6 C) (Oral)  Resp 18  Ht 5\' 10"  (1.778 m)  Wt 212 lb (96.163 kg)  BMI 30.42 kg/m2  SpO2 100% Physical Exam  Constitutional: She is oriented to person, place, and  time. She appears well-developed and well-nourished. No distress.  HENT:  Head: Normocephalic and atraumatic.  Right Ear: External ear normal.  Left Ear: External ear normal.  Nose: Nose normal.  Mouth/Throat: Oropharynx is clear and moist.  Eyes: Conjunctivae are normal.  Neck: Normal range of motion. Neck supple.  Cardiovascular: Normal rate, regular rhythm and normal heart sounds.   Pulmonary/Chest: Effort normal and breath sounds normal. No respiratory distress.  Abdominal: Soft. Bowel sounds are normal. She exhibits no distension. There is no tenderness. There is no rebound and no guarding.  Musculoskeletal: Normal range of motion.   Neurological: She is alert and oriented to person, place, and time.  Skin: Skin is warm and dry. She is not diaphoretic.  Psychiatric: She has a normal mood and affect.  Nursing note and vitals reviewed.   ED Course  Procedures (including critical care time) Medications  ondansetron (ZOFRAN-ODT) 4 MG disintegrating tablet (not administered)  ondansetron (ZOFRAN-ODT) disintegrating tablet 4 mg (4 mg Oral Given 02/02/15 1818)  acetaminophen (TYLENOL) tablet 325 mg (325 mg Oral Given 02/02/15 2250)    Labs Review Labs Reviewed  BASIC METABOLIC PANEL - Abnormal; Notable for the following:    Potassium 3.3 (*)    Glucose, Bld 124 (*)    All other components within normal limits  URINALYSIS, ROUTINE W REFLEX MICROSCOPIC - Abnormal; Notable for the following:    Color, Urine AMBER (*)    Specific Gravity, Urine 1.034 (*)    Bilirubin Urine SMALL (*)    All other components within normal limits  CBC    Imaging Review No results found.   EKG Interpretation None      MDM   Final diagnoses:  Non-intractable vomiting with nausea, vomiting of unspecified type    Filed Vitals:   02/02/15 2245  BP: 134/69  Pulse: 81  Temp:   Resp:    Afebrile, NAD, non-toxic appearing, AAOx4.  Patient with symptoms consistent with viral gastritis.  Vitals are stable, no fever.  No signs of dehydration, tolerating PO fluids > 6 oz.  Lungs are clear.  No focal abdominal pain, no concern for appendicitis, cholecystitis, pancreatitis, ruptured viscus, UTI, kidney stone, or any other abdominal etiology.  Supportive therapy indicated with return if symptoms worsen.  Patient counseled. Patient is stable at time of discharge      Harlow Mares, PA-C 02/03/15 0012  Pamella Pert, MD 02/03/15 1134

## 2015-02-02 NOTE — ED Notes (Signed)
Pt reports that she has been vomiting since 4am and unable to keep anything down, admits to generalized body aches and head pain.  Denies diarrhea.

## 2015-02-03 MED ORDER — ONDANSETRON 4 MG PO TBDP: 4.0000 mg | ORAL_TABLET | Freq: Three times a day (TID) | ORAL | Status: DC | PRN

## 2015-02-03 NOTE — Discharge Instructions (Signed)
Please follow up with your primary care physician in 1-2 days. If you do not have one please call the [facility] number listed above. Please read all discharge instructions and return precautions.    Nausea and Vomiting Nausea is a sick feeling that often comes before throwing up (vomiting). Vomiting is a reflex where stomach contents come out of your mouth. Vomiting can cause severe loss of body fluids (dehydration). Children and elderly adults can become dehydrated quickly, especially if they also have diarrhea. Nausea and vomiting are symptoms of a condition or disease. It is important to find the cause of your symptoms. CAUSES   Direct irritation of the stomach lining. This irritation can result from increased acid production (gastroesophageal reflux disease), infection, food poisoning, taking certain medicines (such as nonsteroidal anti-inflammatory drugs), alcohol use, or tobacco use.  Signals from the brain.These signals could be caused by a headache, heat exposure, an inner ear disturbance, increased pressure in the brain from injury, infection, a tumor, or a concussion, pain, emotional stimulus, or metabolic problems.  An obstruction in the gastrointestinal tract (bowel obstruction).  Illnesses such as diabetes, hepatitis, gallbladder problems, appendicitis, kidney problems, cancer, sepsis, atypical symptoms of a heart attack, or eating disorders.  Medical treatments such as chemotherapy and radiation.  Receiving medicine that makes you sleep (general anesthetic) during surgery. DIAGNOSIS Your caregiver may ask for tests to be done if the problems do not improve after a few days. Tests may also be done if symptoms are severe or if the reason for the nausea and vomiting is not clear. Tests may include:  Urine tests.  Blood tests.  Stool tests.  Cultures (to look for evidence of infection).  X-rays or other imaging studies. Test results can help your  caregiver make decisions about treatment or the need for additional tests. TREATMENT You need to stay well hydrated. Drink frequently but in small amounts.You may wish to drink water, sports drinks, clear broth, or eat frozen ice pops or gelatin dessert to help stay hydrated.When you eat, eating slowly may help prevent nausea.There are also some antinausea medicines that may help prevent nausea. HOME CARE INSTRUCTIONS   Take all medicine as directed by your caregiver.  If you do not have an appetite, do not force yourself to eat. However, you must continue to drink fluids.  If you have an appetite, eat a normal diet unless your caregiver tells you differently.  Eat a variety of complex carbohydrates (rice, wheat, potatoes, bread), lean meats, yogurt, fruits, and vegetables.  Avoid high-fat foods because they are more difficult to digest.  Drink enough water and fluids to keep your urine clear or pale yellow.  If you are dehydrated, ask your caregiver for specific rehydration instructions. Signs of dehydration may include:  Severe thirst.  Dry lips and mouth.  Dizziness.  Dark urine.  Decreasing urine frequency and amount.  Confusion.  Rapid breathing or pulse. SEEK IMMEDIATE MEDICAL CARE IF:   You have blood or brown flecks (like coffee grounds) in your vomit.  You have black or bloody stools.  You have a severe headache or stiff neck.  You are confused.  You have severe abdominal pain.  You have chest pain or trouble breathing.  You do not urinate at least once every 8 hours.  You develop cold or clammy skin.  You continue to vomit for longer than 24 to 48 hours.  You have a fever. MAKE SURE YOU:   Understand these instructions.  Will watch your condition.  Will get help right away if you are not doing well or get worse. Document Released: 11/17/2005 Document Revised: 02/09/2012 Document Reviewed: 04/16/2011 [facility] Patient Information 2015  [place], [place]. This information is not intended to replace advice given to you by your health care provider. Make sure you discuss any questions you have with your health care provider.

## 2015-07-18 ENCOUNTER — Encounter: Payer: Self-pay | Admitting: Family Medicine

## 2015-07-18 ENCOUNTER — Ambulatory Visit (INDEPENDENT_AMBULATORY_CARE_PROVIDER_SITE_OTHER): Payer: 59 | Admitting: Family Medicine

## 2015-07-18 VITALS — BP 122/78 | HR 64 | Temp 97.8°F | Wt 212.4 lb

## 2015-07-18 DIAGNOSIS — N3001 Acute cystitis with hematuria: Secondary | ICD-10-CM | POA: Diagnosis not present

## 2015-07-18 DIAGNOSIS — R300 Dysuria: Secondary | ICD-10-CM | POA: Diagnosis not present

## 2015-07-18 LAB — POCT URINALYSIS DIPSTICK
Bilirubin, UA: NEGATIVE
Glucose, UA: NEGATIVE
KETONES UA: NEGATIVE
PH UA: 6
Spec Grav, UA: 1.02
UROBILINOGEN UA: NEGATIVE

## 2015-07-18 MED ORDER — CIPROFLOXACIN HCL 250 MG PO TABS: 250.0000 mg | ORAL_TABLET | Freq: Two times a day (BID) | ORAL | Status: DC

## 2015-07-18 MED ORDER — PHENAZOPYRIDINE HCL 200 MG PO TABS: 200.0000 mg | ORAL_TABLET | Freq: Three times a day (TID) | ORAL | Status: DC | PRN

## 2015-07-18 MED ORDER — SULFAMETHOXAZOLE-TRIMETHOPRIM 800-160 MG PO TABS: 1.0000 | ORAL_TABLET | Freq: Two times a day (BID) | ORAL | Status: DC

## 2015-07-18 NOTE — Patient Instructions (Signed)
  Please let us know if you are not completely better after finishing antibiotic. You make take pyridium for 2 days for pain relief. Let us know sooner if you develop a high fever or worsening symptoms  Urinary Tract Infection Urinary tract infections (UTIs) can develop anywhere along your urinary tract. Your urinary tract is your body's drainage system for removing wastes and extra water. Your urinary tract includes two kidneys, two ureters, a bladder, and a urethra. Your kidneys are a pair of bean-shaped organs. Each kidney is about the size of your fist. They are located below your ribs, one on each side of your spine. CAUSES Infections are caused by microbes, which are microscopic organisms, including fungi, viruses, and bacteria. These organisms are so small that they can only be seen through a microscope. Bacteria are the microbes that most commonly cause UTIs. SYMPTOMS  Symptoms of UTIs may vary by age and gender of the patient and by the location of the infection. Symptoms in young women typically include a frequent and intense urge to urinate and a painful, burning feeling in the bladder or urethra during urination. Older women and men are more likely to be tired, shaky, and weak and have muscle aches and abdominal pain. A fever may mean the infection is in your kidneys. Other symptoms of a kidney infection include pain in your back or sides below the ribs, nausea, and vomiting. DIAGNOSIS To diagnose a UTI, your caregiver will ask you about your symptoms. Your caregiver also will ask to provide a urine sample. The urine sample will be tested for bacteria and white blood cells. White blood cells are made by your body to help fight infection. TREATMENT  Typically, UTIs can be treated with medication. Because most UTIs are caused by a bacterial infection, they usually can be treated with the use of antibiotics. The choice of antibiotic and length of treatment depend on your symptoms and the type of  bacteria causing your infection. HOME CARE INSTRUCTIONS  If you were prescribed antibiotics, take them exactly as your caregiver instructs you. Finish the medication even if you feel better after you have only taken some of the medication.  Drink enough water and fluids to keep your urine clear or pale yellow.  Avoid caffeine, tea, and carbonated beverages. They tend to irritate your bladder.  Empty your bladder often. Avoid holding urine for long periods of time.  Empty your bladder before and after sexual intercourse.  After a bowel movement, women should cleanse from front to back. Use each tissue only once. SEEK MEDICAL CARE IF:   You have back pain.  You develop a fever.  Your symptoms do not begin to resolve within 3 days. SEEK IMMEDIATE MEDICAL CARE IF:   You have severe back pain or lower abdominal pain.  You develop chills.  You have nausea or vomiting.  You have continued burning or discomfort with urination. MAKE SURE YOU:   Understand these instructions.  Will watch your condition.  Will get help right away if you are not doing well or get worse. Document Released: 08/27/2005 Document Revised: 05/18/2012 Document Reviewed: 12/26/2011 [facility] Patient Information 2015 [place], [place]. This information is not intended to replace advice given to you by your health care provider. Make sure you discuss any questions you have with your health care provider.

## 2015-07-18 NOTE — Progress Notes (Signed)
   Subjective:    Patient ID: Tara Rodriguez, female    DOB: Aug 28, 1971, 44 y.o.   MRN: [id number]  HPI She presents with a six-day history of burning with urination, urinary frequency, suprapubic pain, and malodorous urine. She states she has not had a recent UTI, and that it's been over a year since her last antibiotic use. She denies fever, chills, nausea, vomiting, diarrhea, back pain. Reports that she has been taking over-the-counter phenazopyridine for the past 2 days without relief.   Reviewed allergies, past medical and social history.   Review of Systems Pertinent positives and negatives in the history of present illness, systems otherwise negative.    Objective:   Physical Exam  Constitutional: She is oriented to person, place, and time. She appears well-developed and well-nourished. No distress.  Abdominal: Normal appearance and bowel sounds are normal. There is no hepatosplenomegaly. There is tenderness in the suprapubic area. There is no rigidity, no rebound, no guarding and no CVA tenderness.  Neurological: She is alert and oriented to person, place, and time.  Skin: Skin is warm, dry and intact. No rash noted.    Urinalysis dipstick positive for leukocytes, nitrites, blood.      Assessment & Plan:  Acute cystitis with hematuria - Plan: Urine culture, phenazopyridine (PYRIDIUM) 200 MG tablet, sulfamethoxazole-trimethoprim (BACTRIM DS,SEPTRA DS) 800-160 MG per tablet, DISCONTINUED: ciprofloxacin (CIPRO) 250 MG tablet  Dysuria - Plan: POCT urinalysis dipstick, Urine culture  Advised her to complete the antibiotic, Bactrim, and to let us know if she is not back to normal after completing it. She may take prescription Pyridium for the next 2 days and then stop it. Advised her to drink plenty of water and educated her on UTI prevention such as staying hydrated, voiding when the urge is present, proper hygiene and voiding after sexual intercourse. Instructed her to contact us if  she develops worsening symptoms such as fever, chills, severe abdominal or back pain. Urine culture pending. We will get a follow-up urine specimen in 2 weeks since hematuria was present.

## 2015-07-21 LAB — URINE CULTURE

## 2015-10-15 ENCOUNTER — Encounter: Payer: Self-pay | Admitting: Medical

## 2015-10-15 ENCOUNTER — Ambulatory Visit (INDEPENDENT_AMBULATORY_CARE_PROVIDER_SITE_OTHER): Payer: 59 | Admitting: Medical

## 2015-10-15 VITALS — BP 112/88 | HR 78 | Ht 68.75 in | Wt 211.0 lb

## 2015-10-15 DIAGNOSIS — E786 Lipoprotein deficiency: Secondary | ICD-10-CM

## 2015-10-15 DIAGNOSIS — Z0000 Encounter for general adult medical examination without abnormal findings: Secondary | ICD-10-CM | POA: Diagnosis not present

## 2015-10-15 DIAGNOSIS — L918 Other hypertrophic disorders of the skin: Secondary | ICD-10-CM

## 2015-10-15 DIAGNOSIS — G4700 Insomnia, unspecified: Secondary | ICD-10-CM | POA: Diagnosis not present

## 2015-10-15 DIAGNOSIS — L989 Disorder of the skin and subcutaneous tissue, unspecified: Secondary | ICD-10-CM

## 2015-10-15 DIAGNOSIS — N951 Menopausal and female climacteric states: Secondary | ICD-10-CM

## 2015-10-15 DIAGNOSIS — K036 Deposits [accretions] on teeth: Secondary | ICD-10-CM | POA: Diagnosis not present

## 2015-10-15 DIAGNOSIS — Z1239 Encounter for other screening for malignant neoplasm of breast: Secondary | ICD-10-CM

## 2015-10-15 DIAGNOSIS — R7301 Impaired fasting glucose: Secondary | ICD-10-CM | POA: Diagnosis not present

## 2015-10-15 DIAGNOSIS — E669 Obesity, unspecified: Secondary | ICD-10-CM | POA: Diagnosis not present

## 2015-10-15 LAB — LIPID PANEL
CHOLESTEROL: 120 mg/dL — AB (ref 125–200)
HDL: 37 mg/dL — AB (ref >=46)
LDL Cholesterol: 72 mg/dL (ref <130)
Total CHOL/HDL Ratio: 3.2 Ratio (ref <=5.0)
Triglycerides: 57 mg/dL (ref <150)
VLDL: 11 mg/dL (ref <30)

## 2015-10-15 LAB — POCT URINALYSIS DIPSTICK
Bilirubin, UA: NEGATIVE
Blood, UA: NEGATIVE
Glucose, UA: NEGATIVE
KETONES UA: NEGATIVE
LEUKOCYTES UA: NEGATIVE
Nitrite, UA: NEGATIVE
PH UA: 5.5
PROTEIN UA: NEGATIVE
Urobilinogen, UA: NEGATIVE

## 2015-10-15 LAB — CBC
HCT: 41.4 % (ref 36.0–46.0)
HEMOGLOBIN: 14 g/dL (ref 12.0–15.0)
MCH: 29.5 pg (ref 26.0–34.0)
MCHC: 33.8 g/dL (ref 30.0–36.0)
MCV: 87.2 fL (ref 78.0–100.0)
MPV: 9 fL (ref 8.6–12.4)
PLATELETS: 310 10*3/uL (ref 150–400)
RBC: 4.75 MIL/uL (ref 3.87–5.11)
RDW: 14.4 % (ref 11.5–15.5)
WBC: 8.2 10*3/uL (ref 4.0–10.5)

## 2015-10-15 LAB — TSH: TSH: 1.309 u[IU]/mL (ref 0.350–4.500)

## 2015-10-15 LAB — BASIC METABOLIC PANEL
BUN: 9 mg/dL (ref 7–25)
CALCIUM: 9.2 mg/dL (ref 8.6–10.2)
CO2: 27 mmol/L (ref 20–31)
Chloride: 105 mmol/L (ref 98–110)
Creat: 0.75 mg/dL (ref 0.50–1.10)
GLUCOSE: 85 mg/dL (ref 65–99)
Potassium: 3.7 mmol/L (ref 3.5–5.3)
SODIUM: 141 mmol/L (ref 135–146)

## 2015-10-15 MED ORDER — SUVOREXANT 15 MG PO TABS: 1.0000 | ORAL_TABLET | Freq: Every day | ORAL | Status: DC

## 2015-10-15 NOTE — Patient Instructions (Signed)
   Dr. Jonna Coup, dentist [address], [place], [place] [address] [phone] [email or web address]

## 2015-10-15 NOTE — Progress Notes (Signed)
Subjective:   HPI  Tara Rodriguez is a 44 y.o. female who presents for a complete physical.  Concerns: Perimenopausal - has problems with sleep.  Has tried Melatonin, Zquil, other OTC aids with no relief.  Only gets a few hours of sleep on many nights per week. Can usual get to sleep ok, but awakes out of the blue, hard to get back to sleep..  Has occasional hot flashes.    Sometimes irritable. Hasn't tried any medication for menopausal symptoms.   She reportedly had complete hysterectomy.  Hx/o migraines, but no recent problems with headaches  Reviewed their medical, surgical, family, social, medication, and allergy history and updated chart as appropriate.  Past Medical History  Diagnosis Date  . Allergy   . Wears glasses   . Migraine   . S/P hysterectomy     hx/o fibroids  . Blood transfusion without reported diagnosis     during hysterectomy  . Anemia     history of    Past Surgical History  Procedure Laterality Date  . Colonoscopy  2009    due to anemia  . Abdominal hysterectomy  2009    total, due to fibroids    Social History   Social History  . Marital Status: Single    Spouse Name: N/A  . Number of Children: N/A  . Years of Education: N/A   Occupational History  . Not on file.   Social History Main Topics  . Smoking status: Never Smoker   . Smokeless tobacco: Not on file  . Alcohol Use: No  . Drug Use: No  . Sexual Activity: Not on file   Other Topics Concern  . Not on file   Social History Narrative   Lives at home with daughter, works as CNA, moderate exercise with walking, stretching    Family History  Problem Relation Age of Onset  . Heart disease Mother     pacemaker  . Alcohol abuse Father   . Cancer Maternal Grandmother     breast  . Stroke Maternal Grandfather   . Cancer Paternal Grandmother     colon  . Diabetes Neg Hx   . Hypertension Neg Hx   . Hyperlipidemia Neg Hx      Current outpatient prescriptions:  .   Aspirin-Acetaminophen (GOODYS BODY PAIN PO), Take 1 packet by mouth daily as needed (pain)., Disp: , Rfl:  .  cetirizine (ZYRTEC) 10 MG tablet, Take 1 tablet (10 mg total) by mouth at bedtime. (Patient not taking: Reported on 10/15/2015), Disp: 30 tablet, Rfl: 11 .  phenazopyridine (PYRIDIUM) 200 MG tablet, Take 1 tablet (200 mg total) by mouth 3 (three) times daily as needed for pain. (Patient not taking: Reported on 10/15/2015), Disp: 10 tablet, Rfl: 0 .  sulfamethoxazole-trimethoprim (BACTRIM DS,SEPTRA DS) 800-160 MG per tablet, Take 1 tablet by mouth 2 (two) times daily. (Patient not taking: Reported on 10/15/2015), Disp: 14 tablet, Rfl: 0  No Known Allergies   Review of Systems Constitutional: -fever, -chills, -sweats, -unexpected weight change, -decreased appetite, -fatigue Allergy: -sneezing, -itching, -congestion Dermatology: -changing moles, --rash, -lumps ENT: -runny nose, -ear pain,-sore throat, -hoarseness, -sinus pain, -teeth pain, - ringing in ears, -hearing loss, -nosebleeds Cardiology: -chest pain, -palpitations, -swelling, -difficulty breathing when lying flat, -waking up short of breath Respiratory: -cough, -shortness of breath, -difficulty breathing with exercise or exertion, -wheezing, -coughing up blood Gastroenterology: -abdominal pain(upper), -nausea, -vomiting, -diarrhea, -constipation, -blood in stool, -changes in bowel movement, -difficulty swallowing or eating Hematology: -bleeding, -  bruising  Musculoskeletal: -joint aches, -muscle aches, -joint swelling, -back pain, -neck pain, -cramping, -changes in gait Ophthalmology: denies vision changes, eye redness, itching, discharge Urology: -burning with urination, -difficulty urinating, -blood in urine, -urinary frequency, -urgency, -incontinence Neurology: -headache, -weakness, -tingling, -numbness, -memory loss, -falls, -dizziness Psychology: -depressed mood, -agitation, -sleep problems     Objective:   Physical  Exam  BP 112/88 mmHg  Pulse 78  Ht 5' 8.75" (1.746 m)  Wt 211 lb (95.709 kg)  BMI 31.40 kg/m2  SpO2 97%  General appearance: alert, no distress, WD/WN, AA female Skin: numerous skin tags around neck, in bilat axilla and inferomedial to bilat breasts HEENT: normocephalic, conjunctiva/corneas normal, sclerae anicteric, PERRLA, EOMi, nares patent, no discharge or erythema, pharynx normal Oral cavity: MMM, tongue normal, teeth -moderate lower teeth plaque Neck: supple, no lymphadenopathy, no thyromegaly, no masses, normal ROM, no bruits Chest: non tender, normal shape and expansion Heart: RRR, normal S1, S2, no murmurs Lungs: CTA bilaterally, no wheezes, rhonchi, or rales Abdomen: +bs, soft, lower abdomen vertical midline surgical scar, otherwise non tender, non distended, no masses, no hepatomegaly, no splenomegaly, no bruits Back: non tender, normal ROM, no scoliosis Musculoskeletal: upper extremities non tender, no obvious deformity, normal ROM throughout, lower extremities non tender, no obvious deformity, normal ROM throughout Extremities: no edema, no cyanosis, no clubbing Pulses: 2+ symmetric, upper and lower extremities, normal cap refill Neurological: alert, oriented x 3, CN2-12 intact, strength normal upper extremities and lower extremities, sensation normal throughout, DTRs 2+ throughout, no cerebellar signs, gait normal Psychiatric: normal affect, behavior normal, pleasant  Breast: nontender, no masses or lumps, no skin changes, no nipple discharge or inversion, no axillary lymphadenopathy Gyn: Normal external genitalia without lesions, vagina with normal mucosa, s/p hysterectomy ,no abnormal vaginal discharge.   adnexa not enlarged, non tender, no masses. Exam chaperoned by nurse. Rectal: anus normal appearing    Assessment and Plan :    Encounter Diagnoses  Name Primary?  . Encounter for health maintenance examination in adult Yes  . Screening for breast cancer   .  Insomnia   . Impaired fasting blood sugar   . Low HDL (under 40)   . Obesity   . Perimenopausal   . Cutaneous skin tags   . Changing skin lesion   . Dental plaque      Physical exam - discussed healthy lifestyle, diet, exercise, preventative care, vaccinations, and addressed their concerns.    Gave info to go for first mammogram Insomnia - discussed sleep hygiene.  Begin trial of Belsomra.   Failed Melatonin, Zquil, other OTC sleep aids Impaired glucose - discussed prior labs, exercise, diet.  F/u pending labs Low HDL - discussed diagnosis, diet Obesity - work on lifestyle changes Perimenopausal - discussed symptoms of menopausal, water intake, exercise, healthy diet.    Skin tags -f/u for excision Dental plaque - see dentist soon See your eye doctor yearly for routine vision care. Return pending labs.

## 2015-10-16 LAB — FSH/LH
FSH: 53.7 m[IU]/mL
LH: 28.8 m[IU]/mL

## 2015-10-16 LAB — HEMOGLOBIN A1C
HEMOGLOBIN A1C: 5.9 % — AB (ref <5.7)
Mean Plasma Glucose: 123 mg/dL — ABNORMAL HIGH (ref <117)

## 2015-12-21 ENCOUNTER — Ambulatory Visit (INDEPENDENT_AMBULATORY_CARE_PROVIDER_SITE_OTHER): Payer: 59 | Admitting: Medical

## 2015-12-21 ENCOUNTER — Other Ambulatory Visit: Payer: Self-pay | Admitting: Medical

## 2015-12-21 ENCOUNTER — Encounter: Payer: Self-pay | Admitting: Medical

## 2015-12-21 VITALS — BP 110/70 | HR 75 | Wt 208.0 lb

## 2015-12-21 DIAGNOSIS — L989 Disorder of the skin and subcutaneous tissue, unspecified: Secondary | ICD-10-CM | POA: Diagnosis not present

## 2015-12-21 DIAGNOSIS — C4491 Basal cell carcinoma of skin, unspecified: Secondary | ICD-10-CM

## 2015-12-21 DIAGNOSIS — L918 Other hypertrophic disorders of the skin: Secondary | ICD-10-CM

## 2015-12-21 MED ORDER — MUPIROCIN 2 % EX OINT: 1.0000 "application " | TOPICAL_OINTMENT | Freq: Two times a day (BID) | CUTANEOUS | Status: DC

## 2015-12-21 NOTE — Progress Notes (Signed)
Subjective: Chief Complaint  Patient presents with  . Procedure    skin tag removal, under breasts. multiple   Here for skin lesions  She notes numerous skin tags along bilat axilla, under both breasts.   A few are growing.  She has a multicolored lesion of right chest wall under breast, and similar changing lesion left breat inferiorly.   The skin tags are numerous, unsightly and get caught on clothing.  Objective: BP 110/70 mmHg  Pulse 75  Wt 208 lb (94.348 kg)  Gen: wd, wn, nad , AA female There are numerous skin tags present of chest wall under breast and including lower breasts, numerous skin tags bilat axilla.  Right chest wall beneath breast with pedunculated skin lesion, fleshy with brown and purplish lesion, there is a more fixed papular lesion of left breast medial inferior surface, 63mm diameter, brown/pink in color.  All other lesions are fleshy small or fleshy pedunculated skin tags.  No other worrisome lesions   Assessment: Encounter Diagnoses  Name Primary?  . Changing skin lesion Yes  . Fibroepithelioma     Plan: discussed skin findings .  discussed using biopsy of the right chest wall lesion and left chest wall lesion noted above.   All others would be simple excision.  discussed risks/benefits of procedure.   She gives consent.  Cleaned and prepped each area, right axilla, chest, left axilla separately in normal sterile fashion.     Lesion 1 - Used 1% lidocaine with epi for local anesthesia for right chest wall beneath breast, excised entire pedunculated 1cm x 40mm pedunculated brown purple lesion and sent to pathology Lesion 2 - used 1% lidocaine with epi for local anesthesia for left medial inferior breast, excised entire 15mm diameter papular brown pink lesion with sterile razor and sent to pathology Removed approximately 8 small to medium pedunculated skin tags of right axilla and right lateral chest wall,  Removed approximately 6 small to medium pedunculated skin  tags of left axilla and left lateral chest wall Removed approximately 8 small pedunculated skin tags of anterior chest wall beneath breast Most lesions achieved hemostasis quickly spontaneously, used hyphercator to achieve hemostasis of the 2 biopsied lesions Covered lesions with mupirocin ointment and the 2 biopsied lesions with sterile bandage EBL <2cc Patient tolerated procedure well  Discussed wound care, f/u pending pathology.

## 2015-12-25 ENCOUNTER — Encounter: Payer: Self-pay | Admitting: Medical

## 2017-12-01 DIAGNOSIS — Z9289 Personal history of other medical treatment: Secondary | ICD-10-CM

## 2017-12-01 HISTORY — DX: Personal history of other medical treatment: Z92.89

## 2018-01-26 ENCOUNTER — Ambulatory Visit (INDEPENDENT_AMBULATORY_CARE_PROVIDER_SITE_OTHER): Payer: 59 | Admitting: Family Medicine

## 2018-01-26 ENCOUNTER — Encounter: Payer: Self-pay | Admitting: Family Medicine

## 2018-01-26 VITALS — BP 140/88 | HR 65 | Wt 213.6 lb

## 2018-01-26 DIAGNOSIS — M62830 Muscle spasm of back: Secondary | ICD-10-CM

## 2018-01-26 MED ORDER — METHOCARBAMOL 500 MG PO TABS: 500.0000 mg | ORAL_TABLET | Freq: Three times a day (TID) | ORAL | 0 refills | Status: DC | PRN

## 2018-01-26 NOTE — Progress Notes (Signed)
   Subjective:    Patient ID: Tara Rodriguez, female    DOB: Nov 06, 1971, 46 y.o.   MRN: [id number]  HPI Chief Complaint  Patient presents with  . other    lower back pain on right side , no injury. pain started last night    She is here with complaints of right low back pain for the past day and reports a history of the same in the 1990s. Denies injury.  Pain started while she was sitting on her bed last night. Pain feels like a sharp pain, spasm like at times with certain movements. Pain is non radiating.  Rest improves her pain.  Denies numbness, tingling or weakness. No loss of control of bowels or bladder. No saddle anesthesia.  Has been taking extra strength Tylenol with minimal relief.   Denies fever, chills, dizziness, chest pain, palpitations, shortness of breath, abdominal pain, N/V/D, urinary symptoms, LE edema.    Hysterectomy in the past.  . Reviewed allergies, medications, past medical, surgical, family, and social history.    Review of Systems Pertinent positives and negatives in the history of present illness.     Objective:   Physical Exam  Constitutional: She is oriented to person, place, and time. She appears well-developed and well-nourished. No distress.  HENT:  Mouth/Throat: Oropharynx is clear and moist.  Eyes: Conjunctivae are normal.  Neck: Normal range of motion. Neck supple.  Cardiovascular: Normal rate and regular rhythm.  Pulmonary/Chest: Effort normal and breath sounds normal.  Abdominal: Soft. She exhibits no distension.  Musculoskeletal:       Thoracic back: Normal.       Lumbar back: She exhibits decreased range of motion, pain and spasm.       Back:  Neurological: She is alert and oriented to person, place, and time. She has normal strength and normal reflexes. No cranial nerve deficit or sensory deficit. Gait normal.  Negative straight leg raise  Skin: Skin is warm and dry. No rash noted. No pallor.   BP 140/88 (BP Location: Left Arm,  Patient Position: Sitting)   Pulse 65   Wt 213 lb 9.6 oz (96.9 kg)   SpO2 97%   BMI 31.77 kg/m      Assessment & Plan:  Back muscle spasm - Plan: methocarbamol (ROBAXIN) 500 MG tablet  She appears to have a muscle spasm. Normal neuro exam. She will start using heat, topical analgesic and 2 Aleve twice daily for the next few days. Robaxin prescribed to use as needed but advised this is sedating and she should not drive or drink alcohol with it.  She will follow up if not improving or if worsening.  Work note given for today and tomorrow per patient request.

## 2018-01-26 NOTE — Patient Instructions (Signed)
You appear to be having a muscle spasm. Use heat to the area for 15 or 20 minutes twice daily.  Take 2 Aleve twice daily with food for the next 3-4 days.   You can also use topical Biofreeze or Arnacare or other topical pain medications if needed.   Take the muscle relaxant only at bedtime or when you do not have to drive or work. It will sedate you.   Follow up if you are not back to your usual health in one week or if you have any worsening back pain.

## 2018-04-14 ENCOUNTER — Ambulatory Visit (INDEPENDENT_AMBULATORY_CARE_PROVIDER_SITE_OTHER): Payer: 59 | Admitting: Medical

## 2018-04-14 ENCOUNTER — Encounter: Payer: Self-pay | Admitting: Medical

## 2018-04-14 VITALS — BP 158/90 | HR 72 | Temp 98.0°F | Ht 68.75 in | Wt 209.4 lb

## 2018-04-14 DIAGNOSIS — F329 Major depressive disorder, single episode, unspecified: Secondary | ICD-10-CM | POA: Diagnosis not present

## 2018-04-14 DIAGNOSIS — F4321 Adjustment disorder with depressed mood: Secondary | ICD-10-CM | POA: Diagnosis not present

## 2018-04-14 DIAGNOSIS — R4589 Other symptoms and signs involving emotional state: Secondary | ICD-10-CM

## 2018-04-14 MED ORDER — FLUOXETINE HCL 20 MG PO TABS: 20.0000 mg | ORAL_TABLET | Freq: Every day | ORAL | 1 refills | Status: DC

## 2018-04-14 NOTE — Progress Notes (Signed)
Subjective: Chief Complaint  Patient presents with  . Depression    fmla paperwork    Here with grief and depressed mood.   Needs FMLA as she is missing work due to mood and difficulty coping.    Was with boyfriend 20 years.  He passed away 04-14-2018.  They did everything together.   They haven't been apart in 20 years.  he had kidney disease, dialysis, low white cells, RA.  Died of reaction to medication to rheumatoid arthritis.    Some days better than others, but having trouble coping.  Missed 3 weeks of work right after his death.  Says she needed more time.  having feelings she has never felt before, trying to cope but not doing well with this.  Hearing voices telling her to cut herself.   Feeling really down, sad, crying all the time.     Has not seen a Social worker.    Goes to church, but hasn't talked with her pastor.  No prior depression or mental issues in the past.    She does have her children in the area.  Every body has been kind, helpful, understanding, but everything is a reminder of him.     Works at [employer].   Past Medical History:  Diagnosis Date  . Acrochordon    skin, left lower breast  . Allergy   . Anemia    history of  . Blood transfusion without reported diagnosis    during hysterectomy  . Migraine   . S/P hysterectomy    hx/o fibroids  . Wears glasses    Current Outpatient Medications on File Prior to Visit  Medication Sig Dispense Refill  . acetaminophen (TYLENOL) 500 MG tablet Take 500 mg by mouth every 6 (six) hours as needed.    . methocarbamol (ROBAXIN) 500 MG tablet Take 1 tablet (500 mg total) by mouth every 8 (eight) hours as needed for muscle spasms. 30 tablet 0   No current facility-administered medications on file prior to visit.    ROS as in subjective    Objective: BP (!) 158/90   Pulse 72   Temp 98 F (36.7 C) (Oral)   Ht 5' 8.75" (1.746 m)   Wt 209 lb 6.4 oz (95 kg)   SpO2 99%   BMI 31.15 kg/m   Gen: wd, wn, nad,  AA female Crying, quite emotional at times, but does respond to questions appropriately    Assessment: Encounter Diagnoses  Name Primary?  . Grief Yes  . Depressed mood      Plan: Discussed concerns, questions.   She contracts for safety.  Advised if any thoughts of harming self, call here or 911 or go to [facility] ED.  We called to try and get her apt with counseling today or tomorrow.  The only open slots were at [facility]. Thus, she agrees to go to [facility] either today or tomorrow to have counseling visit.    In the meantime, begin trial of medication below.  discussed some coping skills, recommended exercise daily, recommended she continue household chores, continue to interact with family and friends like she is doing.   Advised she continue working but we between psychiatry and [place] we can come up with a work strategy.  This would best be done by mental health office.   She will hold onto FMLA paperwork to speak with [facility] first.   I recommend she call back in the next 2 days to give me an update  on symptoms, how her visit went with [facility] and the [facility] paperwork.   She voices agreement and understanding of plan.  Handout given on mental health resources   Latrece was seen today for depression.  Diagnoses and all orders for this visit:  Grief  Depressed mood  Other orders -     FLUoxetine (PROZAC) 20 MG tablet; Take 1 tablet (20 mg total) by mouth daily.  Spent > 45 minutes face to face with patient in discussion of symptoms, evaluation, plan and recommendations.

## 2018-04-14 NOTE — Patient Instructions (Signed)
RESOURCES in [place], [place]  If you are experiencing a mental health crisis or an emergency, please call 911 or go to the nearest emergency department.  [facility]   [phone] [facility]  [phone] [facility]   [phone]  Suicide Hotline 1-800-Suicide (1-800-784-2433)  National Suicide Prevention Lifeline 1-800-273-TALK  (1-800-273-8255)  Domestic Violence, Rape/Crisis - [facility] 336-273-7273  The National Domestic Violence Hotline 1-800-799-SAFE (1-800-799-7233)  To report Child or Elder Abuse, please call: [place] [employer]  [phone] [place] [employer]  [phone]  LGBT Youth Crisis Line 1-866-488-7386  Teen Crisis line 336-387-6161 or 1-877-332-7333     Psychiatry and Counseling services  [facility] [address], [place], [place] [address] [phone]  Holly Ingram, therapist Dr. Carey Cottle, psychiatrist Dr. Glenn Jennings, child psychiatrist   Dr. Marvetta Vohs Fuller [address], [place], [place] [address] [phone]   Dr. Rupinder Kaur, psychiatry [address], [place], [place] [address] [phone]   [facility] [address], [place], [place] [address] [phone]   [facility] [address], [place], [place] [address] [phone]    Counseling Services (NON- psychiatrist offices)   [facility] [address], [place], [place] [address] [phone]   [facility] [phone] [address], [place], [place] [address]   [facility] [phone]  [email or web address] [address], [place], [place] [address]   Merrianne M. Leff, therapist [phone] [address] [place], [place] [address]   [facility] [phone] [address], [place], [place]  [address]   Jill White-Huffman, therapist [phone] [address], [place], [place] [address]   [facility] [phone] [address], [place], [place] [address]   

## 2018-06-21 ENCOUNTER — Ambulatory Visit (INDEPENDENT_AMBULATORY_CARE_PROVIDER_SITE_OTHER): Payer: 59 | Admitting: Medical

## 2018-06-21 ENCOUNTER — Encounter: Payer: Self-pay | Admitting: Medical

## 2018-06-21 ENCOUNTER — Other Ambulatory Visit: Payer: Self-pay | Admitting: Medical

## 2018-06-21 VITALS — BP 134/80 | HR 60 | Temp 97.9°F | Resp 16 | Ht 69.0 in | Wt 204.8 lb

## 2018-06-21 DIAGNOSIS — Z1239 Encounter for other screening for malignant neoplasm of breast: Secondary | ICD-10-CM

## 2018-06-21 DIAGNOSIS — Z0000 Encounter for general adult medical examination without abnormal findings: Secondary | ICD-10-CM | POA: Diagnosis not present

## 2018-06-21 DIAGNOSIS — E669 Obesity, unspecified: Secondary | ICD-10-CM | POA: Diagnosis not present

## 2018-06-21 DIAGNOSIS — R7301 Impaired fasting glucose: Secondary | ICD-10-CM

## 2018-06-21 DIAGNOSIS — N63 Unspecified lump in unspecified breast: Secondary | ICD-10-CM

## 2018-06-21 DIAGNOSIS — M79672 Pain in left foot: Secondary | ICD-10-CM

## 2018-06-21 DIAGNOSIS — N951 Menopausal and female climacteric states: Secondary | ICD-10-CM | POA: Diagnosis not present

## 2018-06-21 DIAGNOSIS — L989 Disorder of the skin and subcutaneous tissue, unspecified: Secondary | ICD-10-CM

## 2018-06-21 DIAGNOSIS — R200 Anesthesia of skin: Secondary | ICD-10-CM

## 2018-06-21 DIAGNOSIS — R202 Paresthesia of skin: Secondary | ICD-10-CM

## 2018-06-21 DIAGNOSIS — Z1231 Encounter for screening mammogram for malignant neoplasm of breast: Secondary | ICD-10-CM | POA: Diagnosis not present

## 2018-06-21 DIAGNOSIS — L918 Other hypertrophic disorders of the skin: Secondary | ICD-10-CM

## 2018-06-21 LAB — POCT URINALYSIS DIP (PROADVANTAGE DEVICE)
BILIRUBIN UA: NEGATIVE
BILIRUBIN UA: NEGATIVE mg/dL
GLUCOSE UA: NEGATIVE mg/dL
Nitrite, UA: NEGATIVE
PH UA: 6 (ref 5.0–8.0)
Protein Ur, POC: NEGATIVE mg/dL
SPECIFIC GRAVITY, URINE: 1.015
Urobilinogen, Ur: 3.5

## 2018-06-21 NOTE — Progress Notes (Signed)
Subjective:   HPI  Tara Rodriguez is a 47 y.o. female who presents for a complete physical.  Concerns: Of note, boyfriend of 20 years passed in 03/2018.  I saw her in May for grief.  Has some tingling in fingertips bilat.  No wrist pain or swelling  Has pains in bottom of feet, sometimes goes up legs, worse in mornings.   On feet all day as CNA  Hx/o migraines, but no recent problems with headaches  Has skin lesions she wants looked at.  Reviewed their medical, surgical, family, social, medication, and allergy history and updated chart as appropriate.  Past Medical History:  Diagnosis Date  . Acrochordon    skin, left lower breast  . Allergy   . Anemia    history of  . Blood transfusion without reported diagnosis    during hysterectomy  . Migraine   . S/P hysterectomy    hx/o fibroids  . Wears glasses     Past Surgical History:  Procedure Laterality Date  . ABDOMINAL HYSTERECTOMY  2009   total, due to fibroids  . COLONOSCOPY  2009   due to anemia    Social History   Socioeconomic History  . Marital status: Single    Spouse name: Not on file  . Number of children: Not on file  . Years of education: Not on file  . Highest education level: Not on file  Occupational History  . Not on file  Social Needs  . Financial resource strain: Not on file  . Food insecurity:    Worry: Not on file    Inability: Not on file  . Transportation needs:    Medical: Not on file    Non-medical: Not on file  Tobacco Use  . Smoking status: Never Smoker  . Smokeless tobacco: Never Used  Substance and Sexual Activity  . Alcohol use: No  . Drug use: No  . Sexual activity: Not on file  Lifestyle  . Physical activity:    Days per week: Not on file    Minutes per session: Not on file  . Stress: Not on file  Relationships  . Social connections:    Talks on phone: Not on file    Gets together: Not on file    Attends religious service: Not on file    Active member of club or  organization: Not on file    Attends meetings of clubs or organizations: Not on file    Relationship status: Not on file  . Intimate partner violence:    Fear of current or ex partner: Not on file    Emotionally abused: Not on file    Physically abused: Not on file    Forced sexual activity: Not on file  Other Topics Concern  . 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   05/2018    Family History  Problem Relation Age of Onset  . Heart disease Mother        pacemaker  . Diabetes Mother   . Arthritis Mother   . Alcohol abuse Father   . Cancer Maternal Grandmother        breast  . Stroke Maternal Grandfather   . Cancer Paternal Grandmother        colon  . Hypertension Neg Hx   . Hyperlipidemia Neg Hx      Current Outpatient Medications:  .  acetaminophen (  TYLENOL) 500 MG tablet, Take 500 mg by mouth every 6 (six) hours as needed., Disp: , Rfl:  .  FLUoxetine (PROZAC) 20 MG tablet, Take 1 tablet (20 mg total) by mouth daily., Disp: 30 tablet, Rfl: 1 .  methocarbamol (ROBAXIN) 500 MG tablet, Take 1 tablet (500 mg total) by mouth every 8 (eight) hours as needed for muscle spasms. (Patient not taking: Reported on 06/21/2018), Disp: 30 tablet, Rfl: 0  No Known Allergies   Review of Systems Constitutional: -fever, -chills, -sweats, -unexpected weight change, -decreased appetite, -fatigue Allergy: -sneezing, -itching, -congestion Dermatology: -changing moles, --rash, -lumps ENT: -runny nose, -ear pain,-sore throat, -hoarseness, -sinus pain, -teeth pain, - ringing in ears, -hearing loss, -nosebleeds Cardiology: -chest pain, -palpitations, -swelling, -difficulty breathing when lying flat, -waking up short of breath Respiratory: -cough, -shortness of breath, -difficulty breathing with exercise or exertion, -wheezing, -coughing up blood Gastroenterology: -abdominal pain(upper), -nausea,  -vomiting, -diarrhea, -constipation, -blood in stool, -changes in bowel movement, -difficulty swallowing or eating Hematology: -bleeding, -bruising  Musculoskeletal: -joint aches, -muscle aches, -joint swelling, -back pain, -neck pain, -cramping, -changes in gait Ophthalmology: denies vision changes, eye redness, itching, discharge Urology: -burning with urination, -difficulty urinating, -blood in urine, -urinary frequency, -urgency, -incontinence Neurology: -headache, -weakness, -tingling, -numbness, -memory loss, -falls, -dizziness Psychology: -depressed mood, -agitation, -sleep problems     Objective:   Physical Exam  BP 134/80   Pulse 60   Temp 97.9 F (36.6 C) (Oral)   Resp 16   Ht 5\' 9"  (1.753 m)   Wt 204 lb 12.8 oz (92.9 kg)   SpO2 99%   BMI 30.24 kg/m   General appearance: alert, no distress, WD/WN, AA female Skin: Few scattered skin tags left lateral neck, few scattered skin tags right axilla, larger pedunculated skin tag mons pubis, few scattered skin tags bilateral upper inner thighs, skin lesion right upper inner thigh somewhat crusted pedunculated, 1 cm long by 3 mm base, exam chaperoned by nurse HEENT: normocephalic, conjunctiva/corneas normal, sclerae anicteric, PERRLA, EOMi, nares patent, no discharge or erythema, pharynx normal Oral cavity: MMM, tongue normal, teeth -moderate lower teeth plaque Neck: supple, no lymphadenopathy, no thyromegaly, no masses, normal ROM, no bruits Chest: non tender, normal shape and expansion Heart: RRR, normal S1, S2, no murmurs Lungs: CTA bilaterally, no wheezes, rhonchi, or rales Abdomen: +bs, soft, lower abdomen vertical midline surgical scar, otherwise non tender, non distended, no masses, no hepatomegaly, no splenomegaly, no bruits Back: non tender, normal ROM, no scoliosis Musculoskeletal: +tender left heel in general, otherwise upper extremities non tender, no obvious deformity, normal ROM throughout, lower extremities non tender,  no obvious deformity, normal ROM throughout Extremities: no edema, no cyanosis, no clubbing Pulses: 2+ symmetric, upper and lower extremities, normal cap refill Neurological: alert, oriented x 3, CN2-12 intact, strength normal upper extremities and lower extremities, sensation normal throughout, DTRs 2+ throughout, no cerebellar signs, gait normal, hands nontender, normal strength, -tinel's and pha lens Psychiatric: normal affect, behavior normal, pleasant  Breast: nontender, right breast with large round cystic lesion naproxen 6cm diameter 9 o'clock, otherwise no left masses or lumps, no skin changes, no nipple discharge or inversion, no axillary lymphadenopathy Gyn/anus deferred    Assessment and Plan :    Encounter Diagnoses  Name Primary?  . Routine general medical examination at a health care facility Yes  . Encounter for health maintenance examination in adult   . Perimenopausal   . Screening for breast cancer   . Obesity without serious comorbidity, unspecified classification, unspecified  obesity type   . Impaired fasting blood sugar   . Changing skin lesion   . Cutaneous skin tags   . Breast lump   . Numbness and tingling   . Pain of left heel      Physical exam - discussed healthy lifestyle, diet, exercise, preventative care, vaccinations, and addressed their concerns.    See your eye doctor yearly for routine vision care. See your dentist yearly for routine dental care including hygiene visits twice yearly. We will schedule mammogram, right diagnostic, left screening Return at her convenience for skin lesions excisions (multiple skin tags and other lesions, neck, axilla, mons pubic, inner upper thighs Discussed yearly flu shot fingertip numbness - advised reinforced wrist splints, daily stretching, relative rest , NSAID, f/u 52mo Heel pain - discussed possibile etiology, begin heel cups, discussed possible treatment for plantar fascitis F/u 25mo   Yarisa was seen today  for cpe.  Diagnoses and all orders for this visit:  Routine general medical examination at a health care facility -     POCT Urinalysis DIP (Proadvantage Device) -     Comprehensive metabolic panel -     CBC -     Lipid panel -     TSH -     Hemoglobin A1c -     Cancel: MM DIGITAL SCREENING BILATERAL; Future  Encounter for health maintenance examination in adult  Perimenopausal  Screening for breast cancer -     MM Digital Screening Unilat L; Future  Obesity without serious comorbidity, unspecified classification, unspecified obesity type  Impaired fasting blood sugar  Changing skin lesion  Cutaneous skin tags  Breast lump -     Cancel: MM Digital Diagnostic Unilat R; Future -     MM DIAG BREAST TOMO BILATERAL; Future  Numbness and tingling  Pain of left heel

## 2018-06-21 NOTE — Patient Instructions (Signed)
Recommendations:  Fingertip numbness  Get some over the counter wrist splints and begin using these at bedtime for the next 3-4 weeks   You can use over the counter Naprosyn/Aleve once daily for 7-10 days  Follow up in a month  Heel pain  Get some over the counter heel cups and use these for a week or 2 to see if this helps  Your symptoms may be heel spurs, but I suspect plantar fascitis.  Recommendations:  Plantar fascitis  Every morning try doing a tennis ball massage with feet on the floor and towel stretch behind the ball of the foot to stretch the plantar fascia  Order plantar fascitis night time 90 degree splints online, through [employer] for example.   They are typically about $25 each  Avoid going barefoot since your feet flatten out without good arch support  I recommend you use arch support shoe INSERTS.   This will help support the plantar fascia and arches  You can use over the counter pain reliever for worse pain    Plantar Fasciitis Plantar fasciitis is a painful foot condition that affects the heel. It occurs when the band of tissue that connects the toes to the heel bone (plantar fascia) becomes irritated. This can happen after exercising too much or doing other repetitive activities (overuse injury). The pain from plantar fasciitis can range from mild irritation to severe pain that makes it difficult for you to walk or move. The pain is usually worse in the morning or after you have been sitting or lying down for a while. What are the causes? This condition may be caused by:  Standing for long periods of time.  Wearing shoes that do not fit.  Doing high-impact activities, including running, aerobics, and ballet.  Being overweight.  Having an abnormal way of walking (gait).  Having tight calf muscles.  Having high arches in your feet.  Starting a new athletic activity.  What are the signs or symptoms? The main symptom of this condition is heel pain.  Other symptoms include:  Pain that gets worse after activity or exercise.  Pain that is worse in the morning or after resting.  Pain that goes away after you walk for a few minutes.  How is this diagnosed? This condition may be diagnosed based on your signs and symptoms. Your health care provider will also do a physical exam to check for:  A tender area on the bottom of your foot.  A high arch in your foot.  Pain when you move your foot.  Difficulty moving your foot.  You may also need to have imaging studies to confirm the diagnosis. These can include:  X-rays.  Ultrasound.  MRI.  How is this treated? Treatment for plantar fasciitis depends on the severity of the condition. Your treatment may include:  Rest, ice, and over-the-counter pain medicines to manage your pain.  Exercises to stretch your calves and your plantar fascia.  A splint that holds your foot in a stretched, upward position while you sleep (night splint).  Physical therapy to relieve symptoms and prevent problems in the future.  Cortisone injections to relieve severe pain.  Extracorporeal shock wave therapy (ESWT) to stimulate damaged plantar fascia with electrical impulses. It is often used as a last resort before surgery.  Surgery, if other treatments have not worked after 12 months.  Follow these instructions at home:  Take medicines only as directed by your health care provider.  Avoid activities that cause pain.  Roll the bottom of your foot over a bag of ice or a bottle of cold water. Do this for 20 minutes, 3-4 times a day.  Perform simple stretches as directed by your health care provider.  Try wearing athletic shoes with air-sole or gel-sole cushions or soft shoe inserts.  Wear a night splint while sleeping, if directed by your health care provider.  Keep all follow-up appointments with your health care provider. How is this prevented?  Do not perform exercises or activities that  cause heel pain.  Consider finding low-impact activities if you continue to have problems.  Lose weight if you need to. The best way to prevent plantar fasciitis is to avoid the activities that aggravate your plantar fascia. Contact a health care provider if:  Your symptoms do not go away after treatment with home care measures.  Your pain gets worse.  Your pain affects your ability to move or do your daily activities. This information is not intended to replace advice given to you by your health care provider. Make sure you discuss any questions you have with your health care provider. Document Released: 08/12/2001 Document Revised: 04/21/2016 Document Reviewed: 09/27/2014 Elsevier Interactive Patient Education  [employer].

## 2018-06-22 LAB — COMPREHENSIVE METABOLIC PANEL
A/G RATIO: 2 (ref 1.2–2.2)
ALT: 20 IU/L (ref 0–32)
AST: 17 IU/L (ref 0–40)
Albumin: 4.7 g/dL (ref 3.5–5.5)
Alkaline Phosphatase: 81 IU/L (ref 39–117)
BILIRUBIN TOTAL: 0.3 mg/dL (ref 0.0–1.2)
BUN / CREAT RATIO: 11 (ref 9–23)
BUN: 9 mg/dL (ref 6–24)
CO2: 24 mmol/L (ref 20–29)
CREATININE: 0.85 mg/dL (ref 0.57–1.00)
Calcium: 9.2 mg/dL (ref 8.7–10.2)
Chloride: 104 mmol/L (ref 96–106)
GFR calc Af Amer: 94 mL/min/{1.73_m2} (ref >59)
GFR calc non Af Amer: 81 mL/min/{1.73_m2} (ref >59)
GLOBULIN, TOTAL: 2.3 g/dL (ref 1.5–4.5)
Glucose: 92 mg/dL (ref 65–99)
POTASSIUM: 3.8 mmol/L (ref 3.5–5.2)
SODIUM: 141 mmol/L (ref 134–144)
Total Protein: 7 g/dL (ref 6.0–8.5)

## 2018-06-22 LAB — CBC
Hematocrit: 42.7 % (ref 34.0–46.6)
Hemoglobin: 13.7 g/dL (ref 11.1–15.9)
MCH: 28.7 pg (ref 26.6–33.0)
MCHC: 32.1 g/dL (ref 31.5–35.7)
MCV: 90 fL (ref 79–97)
PLATELETS: 332 10*3/uL (ref 150–450)
RBC: 4.77 x10E6/uL (ref 3.77–5.28)
RDW: 14.9 % (ref 12.3–15.4)
WBC: 5.9 10*3/uL (ref 3.4–10.8)

## 2018-06-22 LAB — HEMOGLOBIN A1C
Est. average glucose Bld gHb Est-mCnc: 126 mg/dL
Hgb A1c MFr Bld: 6 % — ABNORMAL HIGH (ref 4.8–5.6)

## 2018-06-22 LAB — LIPID PANEL
CHOLESTEROL TOTAL: 111 mg/dL (ref 100–199)
Chol/HDL Ratio: 2.9 ratio (ref 0.0–4.4)
HDL: 38 mg/dL — ABNORMAL LOW (ref >39)
LDL Calculated: 64 mg/dL (ref 0–99)
Triglycerides: 43 mg/dL (ref 0–149)
VLDL Cholesterol Cal: 9 mg/dL (ref 5–40)

## 2018-06-22 LAB — TSH: TSH: 1.84 u[IU]/mL (ref 0.450–4.500)

## 2018-06-23 ENCOUNTER — Ambulatory Visit
Admission: RE | Admit: 2018-06-23 | Discharge: 2018-06-23 | Disposition: A | Discharge status: Home / Self Care | Payer: 59 | Source: Ambulatory Visit | Attending: Medical | Admitting: Medical

## 2018-06-23 ENCOUNTER — Other Ambulatory Visit: Payer: Self-pay | Admitting: Medical

## 2018-06-23 ENCOUNTER — Other Ambulatory Visit (HOSPITAL_COMMUNITY)
Admission: RE | Admit: 2018-06-23 | Discharge: 2018-06-23 | Disposition: A | Discharge status: Home / Self Care | Payer: 59 | Source: Ambulatory Visit | Attending: Diagnostic Radiology | Admitting: Diagnostic Radiology

## 2018-06-23 DIAGNOSIS — N6310 Unspecified lump in the right breast, unspecified quadrant: Secondary | ICD-10-CM

## 2018-06-23 DIAGNOSIS — R928 Other abnormal and inconclusive findings on diagnostic imaging of breast: Secondary | ICD-10-CM | POA: Diagnosis not present

## 2018-06-23 DIAGNOSIS — N6001 Solitary cyst of right breast: Secondary | ICD-10-CM | POA: Diagnosis not present

## 2018-06-23 DIAGNOSIS — C50411 Malignant neoplasm of upper-outer quadrant of right female breast: Secondary | ICD-10-CM | POA: Diagnosis not present

## 2018-06-23 DIAGNOSIS — N6311 Unspecified lump in the right breast, upper outer quadrant: Secondary | ICD-10-CM | POA: Diagnosis not present

## 2018-06-23 DIAGNOSIS — N63 Unspecified lump in unspecified breast: Secondary | ICD-10-CM

## 2018-06-28 ENCOUNTER — Telehealth: Payer: Self-pay | Admitting: Hematology and Oncology

## 2018-06-28 ENCOUNTER — Telehealth: Payer: Self-pay | Admitting: *Deleted

## 2018-06-28 ENCOUNTER — Encounter: Payer: Self-pay | Admitting: *Deleted

## 2018-06-28 LAB — AEROBIC/ANAEROBIC CULTURE W GRAM STAIN (SURGICAL/DEEP WOUND): Culture: NO GROWTH

## 2018-06-28 LAB — AEROBIC/ANAEROBIC CULTURE (SURGICAL/DEEP WOUND)

## 2018-06-28 NOTE — Telephone Encounter (Signed)
"  I missed a call this past Friday.  Who called and why?"  Scheduled 06-30-2018 for [facility]. Provided appointment information.  Arrive thirty minutes early for registration with free valet parking. Answered question of office location.  Denies further questions or needs at this time.

## 2018-06-28 NOTE — Telephone Encounter (Signed)
Spoke with patient to confirm morning [facility] appointment for 7/31, patient will come in early to fill out paperwork

## 2018-06-29 ENCOUNTER — Other Ambulatory Visit: Payer: Self-pay | Admitting: *Deleted

## 2018-06-29 DIAGNOSIS — C50411 Malignant neoplasm of upper-outer quadrant of right female breast: Secondary | ICD-10-CM | POA: Insufficient documentation

## 2018-06-29 DIAGNOSIS — Z171 Estrogen receptor negative status [ER-]: Secondary | ICD-10-CM | POA: Insufficient documentation

## 2018-06-30 ENCOUNTER — Encounter: Payer: Self-pay | Admitting: Physical Therapy

## 2018-06-30 ENCOUNTER — Inpatient Hospital Stay: Payer: 59

## 2018-06-30 ENCOUNTER — Encounter: Payer: Self-pay | Admitting: *Deleted

## 2018-06-30 ENCOUNTER — Other Ambulatory Visit: Payer: Self-pay

## 2018-06-30 ENCOUNTER — Inpatient Hospital Stay: Payer: 59 | Attending: Hematology and Oncology | Admitting: Hematology and Oncology

## 2018-06-30 ENCOUNTER — Encounter: Payer: Self-pay | Admitting: Medical

## 2018-06-30 ENCOUNTER — Encounter: Payer: Self-pay | Admitting: Hematology and Oncology

## 2018-06-30 ENCOUNTER — Ambulatory Visit
Admission: RE | Admit: 2018-06-30 | Discharge: 2018-06-30 | Disposition: A | Discharge status: Home / Self Care | Payer: 59 | Source: Ambulatory Visit | Attending: Radiation Oncology | Admitting: Radiation Oncology

## 2018-06-30 ENCOUNTER — Other Ambulatory Visit: Payer: Self-pay | Admitting: *Deleted

## 2018-06-30 ENCOUNTER — Ambulatory Visit: Payer: 59 | Attending: General Surgery | Admitting: Physical Therapy

## 2018-06-30 VITALS — BP 123/92 | HR 70 | Temp 98.6°F | Resp 20 | Ht 69.0 in | Wt 202.6 lb

## 2018-06-30 DIAGNOSIS — R293 Abnormal posture: Secondary | ICD-10-CM | POA: Diagnosis not present

## 2018-06-30 DIAGNOSIS — C50411 Malignant neoplasm of upper-outer quadrant of right female breast: Secondary | ICD-10-CM

## 2018-06-30 DIAGNOSIS — Z171 Estrogen receptor negative status [ER-]: Secondary | ICD-10-CM | POA: Diagnosis present

## 2018-06-30 DIAGNOSIS — Z803 Family history of malignant neoplasm of breast: Secondary | ICD-10-CM | POA: Diagnosis not present

## 2018-06-30 LAB — CMP (CANCER CENTER ONLY)
ALBUMIN: 4 g/dL (ref 3.5–5.0)
ALK PHOS: 81 U/L (ref 38–126)
ALT: 18 U/L (ref 0–44)
ANION GAP: 6 (ref 5–15)
AST: 14 U/L — ABNORMAL LOW (ref 15–41)
BILIRUBIN TOTAL: 0.4 mg/dL (ref 0.3–1.2)
BUN: 7 mg/dL (ref 6–20)
CALCIUM: 9.3 mg/dL (ref 8.9–10.3)
CO2: 29 mmol/L (ref 22–32)
Chloride: 107 mmol/L (ref 98–111)
Creatinine: 0.84 mg/dL (ref 0.44–1.00)
GFR, Estimated: >60 mL/min (ref >60)
GLUCOSE: 154 mg/dL — AB (ref 70–99)
Potassium: 3.8 mmol/L (ref 3.5–5.1)
Sodium: 142 mmol/L (ref 135–145)
TOTAL PROTEIN: 7.3 g/dL (ref 6.5–8.1)

## 2018-06-30 LAB — CBC WITH DIFFERENTIAL (CANCER CENTER ONLY)
Basophils Absolute: 0.1 10*3/uL (ref 0.0–0.1)
Basophils Relative: 1 %
Eosinophils Absolute: 0.1 10*3/uL (ref 0.0–0.5)
Eosinophils Relative: 2 %
HEMATOCRIT: 41.1 % (ref 34.8–46.6)
HEMOGLOBIN: 13.5 g/dL (ref 11.6–15.9)
LYMPHS ABS: 3.2 10*3/uL (ref 0.9–3.3)
LYMPHS PCT: 58 %
MCH: 29 pg (ref 25.1–34.0)
MCHC: 32.8 g/dL (ref 31.5–36.0)
MCV: 88.6 fL (ref 79.5–101.0)
MONOS PCT: 6 %
Monocytes Absolute: 0.3 10*3/uL (ref 0.1–0.9)
NEUTROS PCT: 33 %
Neutro Abs: 1.8 10*3/uL (ref 1.5–6.5)
Platelet Count: 270 10*3/uL (ref 145–400)
RBC: 4.64 MIL/uL (ref 3.70–5.45)
RDW: 14.1 % (ref 11.2–14.5)
WBC Count: 5.4 10*3/uL (ref 3.9–10.3)

## 2018-06-30 MED ORDER — PROCHLORPERAZINE MALEATE 10 MG PO TABS: 10.0000 mg | ORAL_TABLET | Freq: Four times a day (QID) | ORAL | 1 refills | Status: DC | PRN

## 2018-06-30 MED ORDER — LIDOCAINE-PRILOCAINE 2.5-2.5 % EX CREA: TOPICAL_CREAM | CUTANEOUS | 3 refills | Status: DC

## 2018-06-30 MED ORDER — ONDANSETRON HCL 8 MG PO TABS: 8.0000 mg | ORAL_TABLET | Freq: Two times a day (BID) | ORAL | 1 refills | Status: DC | PRN

## 2018-06-30 MED ORDER — LORAZEPAM 0.5 MG PO TABS: 0.5000 mg | ORAL_TABLET | Freq: Every evening | ORAL | 0 refills | Status: DC | PRN

## 2018-06-30 MED ORDER — DEXAMETHASONE 4 MG PO TABS: 4.0000 mg | ORAL_TABLET | Freq: Every day | ORAL | 0 refills | Status: AC

## 2018-06-30 NOTE — Patient Instructions (Signed)

## 2018-06-30 NOTE — Progress Notes (Signed)
Nutrition Assessment  Reason for Assessment:  Pt seen in Breast Clinic  ASSESSMENT:  47 year old female with new diagnosis of breast cancer.  Past medical history reviewed  Patient reports normal appetite.   Medications:  reviewed  Labs: glucose 154  Anthropometrics:   Height: 69 inches Weight: 202 lb 9.6 BMI: 29   NUTRITION DIAGNOSIS: Food and nutrition related knowledge deficit related to new diagnosis of breast cancer as evidenced by no prior need for nutrition related information.  INTERVENTION:   Discussed and provided packet of information regarding nutritional tips for breast cancer patients.  Questions answered.  Teachback method used.  Contact information provided and patient knows to contact me with questions/concerns.    MONITORING, EVALUATION, and GOAL: Pt will consume a healthy plant based diet to maintain lean body mass throughout treatment.   Roselia Snipe B. Zenia Resides, [place], [place] Registered Dietitian [phone] (pager)

## 2018-06-30 NOTE — Progress Notes (Signed)
[facility] CONSULT NOTE  Patient Care Team: Tysinger, Camelia Eng, PA-C as PCP - General (Family Medicine) Excell Seltzer, MD as Consulting Physician (General Surgery) Nicholas Lose, MD as Consulting Physician (Hematology and Oncology) Gery Pray, MD as Consulting Physician (Radiation Oncology)  CHIEF COMPLAINTS/PURPOSE OF CONSULTATION:  Newly diagnosed breast cancer  HISTORY OF PRESENTING ILLNESS:  Tara Rodriguez 47 y.o. female is here because of recent diagnosis of right breast cancer.  Patient felt a lump in the right breast about 2 months ago.  This was further evaluated by mammogram and ultrasound and it revealed a solid and cystic lesion measuring 4.1 cm at the 10 o'clock position.  Axilla was negative.  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  She was presented this morning to the multidisciplinary tumor board and she is here today accompanied by her daughter to discuss her treatment plan.  She tells me that in March 19, 2023 her husband passed away because of a medication reaction.  She is deeply distressed having to go through breast cancer treatments soon after her husband passing.  I reviewed her records extensively and collaborated the history with the patient.  SUMMARY OF ONCOLOGIC HISTORY:   Malignant neoplasm of upper-outer quadrant of right breast in female, estrogen receptor negative ([place])   06/23/2018 Initial Diagnosis    Palpable lump in the right breast for 2 months UOQ by mammogram and ultrasound cystic and solid lesion at 10 o'clock position measuring 4.1 cm, axilla negative, biopsy revealed IDC grade 3 triple negative with a Ki-67 of 70%, T2 N0 stage IIb clinical stage       MEDICAL HISTORY:  Past Medical History:  Diagnosis Date  . Acrochordon    skin, left lower breast  . Allergy   . Anemia    history of  . Blood transfusion without reported diagnosis    during hysterectomy  . Migraine    . S/P hysterectomy    hx/o fibroids  . Wears glasses     SURGICAL HISTORY: Past Surgical History:  Procedure Laterality Date  . ABDOMINAL HYSTERECTOMY  2009   total, due to fibroids  . COLONOSCOPY  2009   due to anemia    SOCIAL HISTORY: Social History   Socioeconomic History  . Marital status: Single    Spouse name: Not on file  . Number of children: Not on file  . Years of education: Not on file  . Highest education level: Not on file  Occupational History  . Not on file  Social Needs  . Financial resource strain: Not on file  . Food insecurity:    Worry: Not on file    Inability: Not on file  . Transportation needs:    Medical: Not on file    Non-medical: Not on file  Tobacco Use  . Smoking status: Never Smoker  . Smokeless tobacco: Never Used  Substance and Sexual Activity  . Alcohol use: No  . Drug use: No  . Sexual activity: Not on file  Lifestyle  . Physical activity:    Days per week: Not on file    Minutes per session: Not on file  . Stress: Not on file  Relationships  . Social connections:    Talks on phone: Not on file    Gets together: Not on file    Attends religious service: Not on file    Active member of club or organization: Not on file  Attends meetings of clubs or organizations: Not on file    Relationship status: Not on file  . Intimate partner violence:    Fear of current or ex partner: Not on file    Emotionally abused: Not on file    Physically abused: Not on file    Forced sexual activity: Not on file  Other Topics Concern  . 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   05/2018    FAMILY HISTORY: Family History  Problem Relation Age of Onset  . Heart disease Mother        pacemaker  . Diabetes Mother   . Arthritis Mother   . Breast cancer Mother   . Alcohol abuse Father   . Cancer Maternal Grandmother        breast  .  Stroke Maternal Grandfather   . Cancer Paternal Grandmother        colon  . Hypertension Neg Hx   . Hyperlipidemia Neg Hx     ALLERGIES:  has No Known Allergies.  MEDICATIONS:  Current Outpatient Medications  Medication Sig Dispense Refill  . acetaminophen (TYLENOL) 500 MG tablet Take 500 mg by mouth every 6 (six) hours as needed.    . naproxen sodium (ALEVE) 220 MG tablet Take 220 mg by mouth as needed.    Marland Kitchen OVER THE COUNTER MEDICATION     . vitamin E 100 UNIT capsule Take by mouth as needed.     No current facility-administered medications for this visit.     REVIEW OF SYSTEMS:   Constitutional: Denies fevers, chills or abnormal night sweats Eyes: Denies blurriness of vision, double vision or watery eyes Ears, nose, mouth, throat, and face: Denies mucositis or sore throat Respiratory: Denies cough, dyspnea or wheezes Cardiovascular: Denies palpitation, chest discomfort or lower extremity swelling Gastrointestinal:  Denies nausea, heartburn or change in bowel habits Skin: Denies abnormal skin rashes Lymphatics: Denies new lymphadenopathy or easy bruising Neurological:Denies numbness, tingling or new weaknesses Behavioral/Psych: Mood is stable, no new changes  Breast: Palpable lump in the right breast All other systems were reviewed with the patient and are negative.  PHYSICAL EXAMINATION: ECOG PERFORMANCE STATUS: 1 - Symptomatic but completely ambulatory  Vitals:   06/30/18 0833  BP: (!) 123/92  Pulse: 70  Resp: 20  Temp: 98.6 F (37 C)  SpO2: 100%   Filed Weights   06/30/18 0833  Weight: 202 lb 9.6 oz (91.9 kg)    GENERAL:alert, no distress and comfortable SKIN: skin color, texture, turgor are normal, no rashes or significant lesions EYES: normal, conjunctiva are pink and non-injected, sclera clear OROPHARYNX:no exudate, no erythema and lips, buccal mucosa, and tongue normal  NECK: supple, thyroid normal size, non-tender, without nodularity LYMPH:  no  palpable lymphadenopathy in the cervical, axillary or inguinal LUNGS: clear to auscultation and percussion with normal breathing effort HEART: regular rate & rhythm and no murmurs and no lower extremity edema ABDOMEN:abdomen soft, non-tender and normal bowel sounds Musculoskeletal:no cyanosis of digits and no clubbing  PSYCH: alert & oriented x 3 with fluent speech NEURO: Mild tingling of her right fingers BREAST: Right breast lump by palpation is at least 6 cm in size.Marland Kitchen No palpable axillary or supraclavicular lymphadenopathy (exam performed in the presence of a chaperone)   LABORATORY DATA:  I have reviewed the data as listed Lab Results  Component Value Date   WBC 5.4 06/30/2018  HGB 13.5 06/30/2018   HCT 41.1 06/30/2018   MCV 88.6 06/30/2018   PLT 270 06/30/2018   Lab Results  Component Value Date   NA 142 06/30/2018   K 3.8 06/30/2018   CL 107 06/30/2018   CO2 29 06/30/2018    RADIOGRAPHIC STUDIES: I have personally reviewed the radiological reports and agreed with the findings in the report.  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 After reviewing the pathology in detail, we proceeded to discuss the different treatment options between surgery, radiation, chemotherapy, antiestrogen therapies.  Recommendation based on multidisciplinary tumor board: 1. Neoadjuvant chemotherapy with Adriamycin and  Cytoxan dose dense 4 followed by Taxol and carboplatin weekly 12 2. Followed by breast conserving surgery with sentinel lymph node study   3. Followed by adjuvant radiation therapy  Chemotherapy Counseling: I discussed the risks and benefits of chemotherapy including the risks of nausea/ vomiting, risk of infection from low WBC count, fatigue due to chemo or anemia, bruising or bleeding due to low platelets, mouth sores, loss/ change in taste and decreased appetite. Liver and kidney function will be monitored through out chemotherapy as abnormalities in liver and kidney function may be a side effect of treatment. Cardiac dysfunction due to Adriamycin was discussed in detail. Risk of permanent bone marrow dysfunction due to chemo were also discussed.  Plan: 1. Port placement to be done next Monday 2. Echocardiogram 3. Chemotherapy class 4. Breast MRI 5. Genetic counseling will also be arranged  UPBEAT clinical trial ([place]): Newly diagnosed stage I to III breast cancer patients receiving either adjuvant or neoadjuvant chemotherapy undergo cardiac MRI before treatment and at 24 months along with neurocognitive testing, exercise and disability measures at baseline 3, 12 and 24 months.  Return to clinic in 2 weeks to start chemotherapy.    All questions were answered. The patient knows to call the clinic with any problems, questions or concerns.    Harriette Ohara, MD 06/30/18

## 2018-06-30 NOTE — Progress Notes (Signed)
Clinical Social Work [facility] Psychosocial Distress Screening [facility]  Patient completed distress screening protocol and scored a 2 on the Psychosocial Distress Thermometer which indicates mild distress. Clinical Social Worker met with patient and patients daughter in [facility] to assess for distress and other psychosocial needs. Patient stated she was feeling overwhelmed but felt "better" after meeting with the treatment team and getting more information on her treatment plan. CSW and patient discussed common feeling and emotions when being diagnosed with cancer, and the importance of support during treatment. CSW informed patient of the support team and support services at [facility]. CSW provided contact information and encouraged patient to call with any questions or concerns.  ONCBCN DISTRESS SCREENING 06/30/2018  Screening Type Initial Screening  Distress experienced in past week (1-10) 2  Information Concerns Type Lack of info about diagnosis;Lack of info about treatment     Abigail Elmore, MSW, LCSW, OSW-C Clinical Social Worker [facility] [phone]       

## 2018-06-30 NOTE — Progress Notes (Signed)
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 female who is presenting to the office today for evaluation of her newly diagnosed breast cancer.  She initially presented with a palpable right lump x 2 months.   She underwent bilateral diagnostic mammography with tomography and right breast ultrasonography at [facility] on 06/23/2018 showing: Breast density category B. Cystic and solid mass versus a complicated cyst in the right breast at 10 o'clock.   Accordingly on 06/23/2018 she proceeded to biopsy of the right breast area in question. The pathology from this procedure showed: Breast, right, needle core biopsy, upper outer, 10 o'clock position with invasive ductal carcinoma. Prognostic indicators significant for: estrogen receptor, 0% negative and progesterone receptor, 0% negative. Proliferation marker Ki67 at 70%. HER2 negative.   On review of systems, She reports a lump noted to her right breast. She denies any other symptoms. She has a family hx of her maternal grandmother with breast cancer.    Menarche:  Age at first live birth: 47 years old GP: GxP4 LMP:  Contraceptive: No HRT: Yes    The patient was referred today for  presentation in the multidisciplinary conference.  Radiology studies and pathology slides were presented there for review and discussion of treatment options.  A consensus was discussed regarding potential next steps.  PREVIOUS RADIATION THERAPY: No  PAST MEDICAL HISTORY:  has a past medical history of Acrochordon, Allergy, Anemia, Blood transfusion without reported diagnosis, Migraine, S/P hysterectomy, and Wears glasses.    PAST SURGICAL HISTORY: Past Surgical History:  Procedure Laterality Date  . ABDOMINAL HYSTERECTOMY  2009   total, due to fibroids  . COLONOSCOPY  2009   due to anemia    FAMILY HISTORY: family history includes Alcohol abuse in her father; Arthritis in her mother; Breast cancer in her mother; Cancer in her maternal grandmother and paternal grandmother; Diabetes in her mother; Heart disease in her mother; Stroke in her maternal grandfather.  SOCIAL HISTORY:  reports that she has never smoked. She has never used smokeless tobacco. She reports that she does not drink alcohol or use drugs.  ALLERGIES: Patient has no known allergies.  MEDICATIONS:  Current Outpatient Medications  Medication Sig Dispense Refill  . acetaminophen (TYLENOL) 500 MG tablet Take 500 mg by mouth every 6 (six) hours as needed.    . naproxen sodium (ALEVE) 220 MG tablet Take 220 mg by mouth as needed.    Marland Kitchen OVER THE COUNTER MEDICATION     . vitamin E 100 UNIT capsule Take by mouth as needed.     No current facility-administered medications for this encounter.     REVIEW OF SYSTEMS:  REVIEW OF SYSTEMS: A 10+ POINT REVIEW OF SYSTEMS WAS OBTAINED including neurology, dermatology, psychiatry, cardiac, respiratory, lymph, extremities, GI,  GU, musculoskeletal, constitutional, reproductive, HEENT. All pertinent positives are noted in the HPI. All others are negative.   PHYSICAL EXAM:   Vitals with BMI 06/30/2018  Height '5\' 9"'$   Weight 202 lbs 10 oz  BMI 20.94  Systolic 709  Diastolic 92  Pulse 70   Respirations 20   General: Alert and oriented, in no acute distress HEENT: Head is normocephalic. Extraocular movements are intact. Oropharynx is clear. Neck: Neck is supple, no palpable cervical or supraclavicular lymphadenopathy. Heart: Regular in rate and rhythm with no murmurs, rubs, or gallops. Chest: Clear to auscultation bilaterally, with no rhonchi, wheezes, or rales. Abdomen: Soft, nontender, nondistended, with no rigidity or guarding. Extremities: No cyanosis or edema. Lymphatics: see Neck Exam Skin: No concerning lesions. Musculoskeletal: symmetric strength and muscle tone throughout. Neurologic: Cranial nerves II through XII are grossly intact. No obvious focalities. Speech is fluent. Coordination is intact. Psychiatric: Judgment and insight are intact. Affect is appropriate. Breast: Left breast large and pendulous without no palpable mass, nipple discharge, or bleeding. Right breast large and pendulous with palpable mass in the UOQ which is estimated to be 4 x 4.5 cm in size, this is mobile and not fixed to any structures. No chest wall or skin involvement. No nipple discharge or bleeding.    KPS = 100  100 - Normal; no complaints; no evidence of disease. 90   - Able to carry on normal activity; minor signs or symptoms of disease. 80   - Normal activity with effort; some signs or symptoms of disease. 68   - Cares for self; unable to carry on normal activity or to do active work. 60   - Requires occasional assistance, but is able to care for most of his personal needs. 50   - Requires considerable assistance and frequent medical care. 12   - Disabled; requires special care and assistance. 81   - Severely disabled; hospital admission is indicated although death not imminent. 46   - Very sick; hospital admission necessary; active supportive treatment necessary. 10   - Moribund; fatal processes progressing rapidly. 0     - Dead  Karnofsky DA, Abelmann Garvin, Craver LS and  Burchenal JH [phone]) The use of the nitrogen mustards in the palliative treatment of carcinoma: with particular reference to bronchogenic carcinoma Cancer 1 [id number]  LABORATORY DATA:  Lab Results  Component Value Date   WBC 5.4 06/30/2018   HGB 13.5 06/30/2018   HCT 41.1 06/30/2018   MCV 88.6 06/30/2018   PLT 270 06/30/2018   Lab Results  Component Value Date   NA 142 06/30/2018   K 3.8 06/30/2018   CL 107 06/30/2018   CO2 29 06/30/2018   Lab Results  Component Value Date   ALT 18 06/30/2018   AST 14 (L) 06/30/2018   ALKPHOS 81 06/30/2018   BILITOT 0.4 06/30/2018    RADIOGRAPHY: US Breast Ltd Uni Right Inc Axilla  Result Date: 06/23/2018 CLINICAL DATA:  Patient presents with a palpable lump in the lateral right breast, with occasional tenderness. EXAM: DIGITAL DIAGNOSTIC BILATERAL MAMMOGRAM WITH CAD AND TOMO ULTRASOUND RIGHT BREAST COMPARISON:  None. ACR Breast Density Category b: There are scattered areas of fibroglandular density. FINDINGS: There is a oval mass in the posterior, lateral right breast, with partly defined and partly irregular margins. This corresponds to the palpable abnormality. There are no other discrete masses. There are no areas of architectural distortion and no suspicious calcifications. Mammographic images were processed with CAD. On physical exam,  there is a firm, smooth, mobile mass bulging the overlying skin in the upper outer right breast. Targeted ultrasound is performed, showing an oval hypo to anechoic mass in the right breast at 10 o'clock, 10 cm from the nipple, measuring 4.1 x 3.2 x 3.8 cm. Margins of the mass are mildly irregular and partly ill-defined. There is hypoechoic material within the mass, which does not show blood flow on color Doppler analysis. There is increased through transmission of the sound beam. Sonographic evaluation of the right axilla demonstrates no enlarged or abnormal lymph nodes. IMPRESSION: 1. Cystic and solid mass versus a  complicated cyst in the right breast at 10 o'clock. This is suspicious enough to warrant sampling. RECOMMENDATION: 1. Recommend ultrasound-guided cyst aspiration and, if unsuccessful or incomplete, ultrasound-guided core needle biopsy of the right breast mass. I have discussed the findings and recommendations with the patient. Results were also provided in writing at the conclusion of the visit. If applicable, a reminder letter will be sent to the patient regarding the next appointment. BI-RADS CATEGORY  4: Suspicious. Electronically Signed   By: Lajean Manes M.D.   On: 06/23/2018 10:54   Mm Diag Breast Tomo Bilateral  Result Date: 06/23/2018 CLINICAL DATA:  Patient presents with a palpable lump in the lateral right breast, with occasional tenderness. EXAM: DIGITAL DIAGNOSTIC BILATERAL MAMMOGRAM WITH CAD AND TOMO ULTRASOUND RIGHT BREAST COMPARISON:  None. ACR Breast Density Category b: There are scattered areas of fibroglandular density. FINDINGS: There is a oval mass in the posterior, lateral right breast, with partly defined and partly irregular margins. This corresponds to the palpable abnormality. There are no other discrete masses. There are no areas of architectural distortion and no suspicious calcifications. Mammographic images were processed with CAD. On physical exam, there is a firm, smooth, mobile mass bulging the overlying skin in the upper outer right breast. Targeted ultrasound is performed, showing an oval hypo to anechoic mass in the right breast at 10 o'clock, 10 cm from the nipple, measuring 4.1 x 3.2 x 3.8 cm. Margins of the mass are mildly irregular and partly ill-defined. There is hypoechoic material within the mass, which does not show blood flow on color Doppler analysis. There is increased through transmission of the sound beam. Sonographic evaluation of the right axilla demonstrates no enlarged or abnormal lymph nodes. IMPRESSION: 1. Cystic and solid mass versus a complicated cyst in  the right breast at 10 o'clock. This is suspicious enough to warrant sampling. RECOMMENDATION: 1. Recommend ultrasound-guided cyst aspiration and, if unsuccessful or incomplete, ultrasound-guided core needle biopsy of the right breast mass. I have discussed the findings and recommendations with the patient. Results were also provided in writing at the conclusion of the visit. If applicable, a reminder letter will be sent to the patient regarding the next appointment. BI-RADS CATEGORY  4: Suspicious. Electronically Signed   By: Lajean Manes M.D.   On: 06/23/2018 10:54   Mm Clip Placement Right  Result Date: 06/23/2018 CLINICAL DATA:  Evaluate clip placement following ultrasound-guided RIGHT breast biopsy. EXAM: DIAGNOSTIC RIGHT MAMMOGRAM POST ULTRASOUND BIOPSY COMPARISON:  Previous exam(s). FINDINGS: Mammographic images were obtained following ultrasound guided biopsy of the 4.1 cm complex cyst at the 10 o'clock position of the RIGHT breast. The COIL shaped clip is in satisfactory position. IMPRESSION: Satisfactory COIL clip position following ultrasound-guided RIGHT breast biopsy. Final Assessment: Post Procedure Mammograms for Marker Placement Electronically Signed   By: Margarette Canada M.D.   On: 06/23/2018 14:54   [place] Rt  Breast Bx W Loc Dev 1st Lesion Img Bx Spec US Guide  Addendum Date: 06/29/2018   ADDENDUM REPORT: 06/28/2018 14:15 ADDENDUM: Right breast aspiration yielded Gram Stain-FEW WBC PRESENT, PREDOMINANTLY MONONUCLEAR, NO ORGANISMS SEEN. Culture-No growth aerobically or anaerobically. I notified the patient of results by telephone and her questions were answered. The patient has a recent diagnosis of right breast cancer and should follow her outlined treatment plan. The patient was referred to [facility] on June 30, 2018. Pathology results reported by Terie Purser, RN on 06/28/2018. Electronically Signed   By: Margarette Canada  M.D.   On: 06/28/2018 14:15   Addendum Date: 06/25/2018   ADDENDUM REPORT: 06/25/2018 12:45 ADDENDUM: Pathology revealed GRADE III INVASIVE DUCTAL CARCINOMA of the Right breast, 10 o'clock position. This was found to be concordant by Dr. Hassan Rowan. Pathology results were discussed with the patient by telephone. The patient reported doing well after the biopsy with tenderness at the site. Post biopsy instructions and care were reviewed and questions were answered. The patient was encouraged to call [facility] for any additional concerns. The patient was referred to [facility] on June 30, 2018. Pathology results reported by Terie Purser, RN on 06/25/2018. Electronically Signed   By: Margarette Canada M.D.   On: 06/25/2018 12:45   Result Date: 06/29/2018 CLINICAL DATA:  47 year old female for aspiration and tissue sampling of UPPER-OUTER RIGHT breast complex cyst. EXAM: ULTRASOUND GUIDED RIGHT BREAST CORE NEEDLE BIOPSY COMPARISON:  Previous exam(s). FINDINGS: I met with the patient and we discussed the procedure of ultrasound-guided biopsy, including benefits and alternatives. We discussed the high likelihood of a successful procedure. We discussed the risks of the procedure, including infection, bleeding, tissue injury, clip migration, and inadequate sampling. Informed written consent was given. The usual time-out protocol was performed immediately prior to the procedure. Using sterile technique and 1% Lidocaine as local anesthetic, under direct ultrasound visualization, a 12 gauge spring-loaded device was used to perform aspiration and biopsy of the 4.1 cm complex cyst at the 10 o'clock position of the RIGHT breast using a LATERAL approach. 15 cc of cloudy fluid was aspirated and sent to microbiology. Solid tissue specimens were obtained with a 12 gauge needle and sent to pathology. At the conclusion of the procedure  a COIL shaped tissue marker clip was deployed into the biopsy cavity. Follow up 2 view mammogram was performed and dictated separately. IMPRESSION: Ultrasound guided aspiration and biopsy of UPPER-OUTER RIGHT breast complex cyst. No apparent complications. Electronically Signed: By: Margarette Canada M.D. 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 Patient will be a good candidate for breast conservation with anticipated lumpectomy and sentinel node procedure with adjuvant radiation therapy to follow.  Today, I talked to the patient and daughter about the findings and work-up thus far. We discussed the natural history of breast cancer and general treatment, highlighting the role of radiotherapy in the management.  We discussed the available radiation techniques, and focused on the details of logistics and delivery. We reviewed the anticipated acute  and late sequelae associated with radiation in this setting.  The patient was encouraged to ask questions that I answered to the best of my ability.    PLAN:  1. MRI on August 7th, 2019 2. Port placement  3. Neoadjuvant chemotherapy 4. Genetics on August 8th, 2019  5. Right lumpectomy and Sentinel node biopsy after neoadjuvant chemotherapy 6. Adjuvant radiation therapy     ------------------------------------------------  Blair Promise, PhD, MD   This document serves as a record of services personally performed by Gery Pray, MD. It was created on his behalf by [facility], a trained medical scribe. The creation of this record is based on the scribe's personal observations and the provider's statements to them. This document has been  checked and approved by the attending provider.

## 2018-06-30 NOTE — Therapy (Signed)
[place], [place], [address] Phone: [phone]   Fax:  [phone]  Physical Therapy Evaluation  Patient Details  Name: Tara Rodriguez MRN: [id number] Date of Birth: 1971/04/03 Referring Provider: Dr. Excell Seltzer   Encounter Date: 06/30/2018  PT End of Session - 06/30/18 1019    Visit Number  1    Number of Visits  1    PT Start Time  0942    PT Stop Time  1005    PT Time Calculation (min)  23 min    Activity Tolerance  Patient tolerated treatment well    Behavior During Therapy  [facility] for tasks assessed/performed       Past Medical History:  Diagnosis Date  . Acrochordon    skin, left lower breast  . Allergy   . Anemia    history of  . Blood transfusion without reported diagnosis    during hysterectomy  . Migraine   . S/P hysterectomy    hx/o fibroids  . Wears glasses     Past Surgical History:  Procedure Laterality Date  . ABDOMINAL HYSTERECTOMY  2009   total, due to fibroids  . COLONOSCOPY  2009   due to anemia    There were no vitals filed for this visit.   Subjective Assessment - 06/30/18 1011    Subjective  Patient reports she is here today to be seen by her medical team for her newly diagnosed right breast cancer.    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 It measures 4.1 cm and is located in the upper outer quadrant with a Ki67 of 70%. She has no other medical problems.    Patient Stated Goals  Learn post op shoulder ROM HEP and reduce risk for lymphedema    Currently in Pain?  Yes    Pain Score  -- varies with activity; 0/10 today    Pain Location  Knee    Pain Orientation  Left    Pain Descriptors / Indicators  Aching    Pain Type  Chronic pain    Pain Onset  More than a month ago    Pain Frequency  Intermittent    Aggravating  Factors   walking, prolonged sitting    Pain Relieving Factors  moving around         [facility] PT Assessment - 06/30/18 0001      Assessment   Medical Diagnosis  Right breast cancer    Referring Provider  Dr. Excell Seltzer    Onset Date/Surgical Date  06/23/18    Hand Dominance  Right    Prior Therapy  none      Precautions   Precautions  Other (comment)    Precaution Comments  active cancer      Restrictions   Weight Bearing Restrictions  No      Balance Screen   Has the patient fallen in the past 6 months  No    Has the patient had a decrease in activity level because of a fear of falling?   No    Is the patient reluctant to leave their home because of a fear of falling?   No      Home Environment   Living Environment  Private residence    Living Arrangements  Children    Available Help at Discharge  Family Lives with 66 y.o. 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    Leisure  She walks 3x/week for 30 min      Cognition   Overall Cognitive Status  Within Functional Limits for tasks assessed      Posture/Postural Control   Posture/Postural Control  Postural limitations    Postural Limitations  Rounded Shoulders;Forward head      ROM / Strength   AROM / PROM / Strength  AROM;Strength      AROM   AROM Assessment Site  Shoulder;Cervical    Right/Left Shoulder  Right;Left    Right Shoulder Extension  42 Degrees    Right Shoulder Flexion  133 Degrees    Right Shoulder ABduction  158 Degrees    Right Shoulder Internal Rotation  77 Degrees    Right Shoulder External Rotation  73 Degrees    Left Shoulder Extension  42 Degrees    Left Shoulder Flexion  142 Degrees    Left Shoulder ABduction  168 Degrees    Left Shoulder Internal Rotation  60 Degrees    Left Shoulder External Rotation  80 Degrees    Cervical Flexion  WNL    Cervical Extension  WNL     Cervical - Right Side Bend  WNL    Cervical - Left Side Bend  WNL    Cervical - Right Rotation  WNL    Cervical - Left Rotation  WNL      Strength   Overall Strength  Within functional limits for tasks performed        LYMPHEDEMA/ONCOLOGY QUESTIONNAIRE - 06/30/18 1017      Type   Cancer Type  Right breast cancer      Lymphedema Assessments   Lymphedema Assessments  Upper extremities      Right Upper Extremity Lymphedema   10 cm Proximal to Olecranon Process  33.1 cm    Olecranon Process  26.4 cm    10 cm Proximal to Ulnar Styloid Process  22.8 cm    Just Proximal to Ulnar Styloid Process  16 cm    Across Hand at [employer]  19.6 cm      Left Upper Extremity Lymphedema   10 cm Proximal to Olecranon Process  31.6 cm    Olecranon Process  25.9 cm    10 cm Proximal to Ulnar Styloid Process  21.8 cm    Just Proximal to Ulnar Styloid Process  15.9 cm    Across Hand at [employer]  19.4 cm    At [place] of 2nd Digit  6.1 cm             Objective measurements completed on examination: See above findings.      Patient was instructed today in a home exercise program today for post op shoulder range of motion. These included active assist shoulder flexion in sitting, scapular retraction, wall walking with shoulder abduction, and hands behind head external rotation.  She was encouraged to do these twice a day, holding 3 seconds and repeating 5 times when permitted by her physician.      PT Education - 06/30/18 1018    Education Details  Lymphedema risk reduction and post op shoulder ROM HEP    Person(s) Educated  Patient;Child(ren)    Methods  Explanation;Demonstration;Handout    Comprehension  Verbalized understanding;Returned demonstration           Breast Clinic Goals - 06/30/18 1105      Patient will be able to verbalize understanding of pertinent lymphedema risk reduction practices relevant to her diagnosis specifically related to skin care.   Time  1     Period  Days    Status  Achieved      Patient will be able to return demonstrate and/or verbalize understanding of the post-op home exercise program related to regaining shoulder range of motion.   Time  1    Period  Days    Status  Achieved      Patient will be able to verbalize understanding of the importance of attending the postoperative After Breast Cancer Class for further lymphedema risk reduction education and therapeutic exercise.   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 It measures 4.1 cm and is located in the upper outer quadrant with a Ki67 of 70%. She has no other medical problems. Her multidisciplinary medical team met prior to her assessments to determine a recommended treatment plan. She is planning to have neoadjuvant chemotherapy followed by a right lumpectomy and sentinel node biopsy and radiation. She will benefit from a post op PT reassessment to determine needs.    History and Personal Factors relevant to plan of care:  None    Clinical Presentation  Stable    Clinical Decision Making  Low    Rehab Potential  Excellent    Clinical Impairments Affecting Rehab Potential  none    PT Frequency  One time visit    PT Treatment/Interventions  ADLs/Self Care Home Management;Therapeutic exercise;Patient/family education    PT Next Visit Plan  Will reassess if MD refers back after surgery    PT Home Exercise Plan  post op shoulder ROM HEP    Consulted and Agree with Plan of Care  Patient;Family member/caregiver    Family Member Consulted  Daughter       Patient will benefit from skilled therapeutic intervention in order to improve the following deficits and impairments:  Impaired UE functional use, Pain, Decreased range of motion  Visit Diagnosis: Malignant neoplasm of upper-outer  quadrant of right breast in female, estrogen receptor negative ([place]) - Plan: PT plan of care cert/re-cert  Abnormal posture - Plan: PT plan of care cert/re-cert     Problem List Patient Active Problem List   Diagnosis Date Noted  . Malignant neoplasm of upper-outer quadrant of right breast in female, estrogen receptor negative ([place]) 06/29/2018  . Numbness and tingling 06/21/2018  . Breast lump 06/21/2018  . Encounter for health maintenance examination in adult 10/15/2015  . Screening for breast cancer 10/15/2015  . Insomnia 10/15/2015  . Impaired fasting blood sugar 10/15/2015  . Low HDL (under 40) 10/15/2015  . Obesity 10/15/2015  . Perimenopausal 10/15/2015  . Cutaneous skin tags 10/15/2015  . Changing skin lesion 10/15/2015  . Dental plaque 10/15/2015    Annia Friendly, PT 06/30/18 11:12 AM  [place], [place], [address] Phone: [phone]   Fax:  [phone]  Name: YENIFER SACCENTE MRN: [id number] Date of Birth: [birth year]

## 2018-06-30 NOTE — Assessment & Plan Note (Signed)
06/23/2018:Palpable lump in the right breast for 2 months UOQ by mammogram and ultrasound cystic and solid lesion at 10 o'clock position measuring 4.1 cm, axilla negative, biopsy revealed IDC grade 3 triple negative with a Ki-67 of 70%, T2 N0 stage IIb clinical stage  Pathology and radiology counseling: Discussed with the patient, the details of pathology including the type of breast cancer,the clinical staging, the significance of ER, PR and HER-2/neu receptors and the implications for treatment. After reviewing the pathology in detail, we proceeded to discuss the different treatment options between surgery, radiation, chemotherapy, antiestrogen therapies.  Recommendation based on multidisciplinary tumor board: 1. Neoadjuvant chemotherapy with Adriamycin and Cytoxan dose dense 4 followed by Taxol and carboplatin weekly 12 2. Followed by breast conserving surgery with sentinel lymph node study   3. Followed by adjuvant radiation therapy  Chemotherapy Counseling: I discussed the risks and benefits of chemotherapy including the risks of nausea/ vomiting, risk of infection from low WBC count, fatigue due to chemo or anemia, bruising or bleeding due to low platelets, mouth sores, loss/ change in taste and decreased appetite. Liver and kidney function will be monitored through out chemotherapy as abnormalities in liver and kidney function may be a side effect of treatment. Cardiac dysfunction due to Adriamycin was discussed in detail. Risk of permanent bone marrow dysfunction due to chemo were also discussed.  Plan: 1. Port placement to be done next Monday 2. Echocardiogram 3. Chemotherapy class 4. Breast MRI 5. CT chest abdomen pelvis and bone scan for staging Genetic counseling will also be arranged  UPBEAT clinical trial ([id number]): Newly diagnosed stage I to III breast cancer patients receiving either adjuvant or neoadjuvant chemotherapy undergo cardiac MRI before treatment and at 24 months along  with neurocognitive testing, exercise and disability measures at baseline 3, 12 and 24 months.  Return to clinic in 2 weeks to start chemotherapy.  

## 2018-06-30 NOTE — Progress Notes (Signed)

## 2018-07-01 ENCOUNTER — Telehealth: Payer: Self-pay | Admitting: Hematology and Oncology

## 2018-07-01 NOTE — Telephone Encounter (Signed)
No 7/31 los or orders.

## 2018-07-02 ENCOUNTER — Other Ambulatory Visit: Payer: Self-pay

## 2018-07-02 ENCOUNTER — Encounter: Payer: Self-pay | Admitting: Medical Oncology

## 2018-07-02 ENCOUNTER — Telehealth: Payer: Self-pay | Admitting: *Deleted

## 2018-07-02 ENCOUNTER — Encounter (HOSPITAL_BASED_OUTPATIENT_CLINIC_OR_DEPARTMENT_OTHER): Payer: Self-pay | Admitting: *Deleted

## 2018-07-02 NOTE — Telephone Encounter (Signed)
Spoke to pt concerning Tara Rodriguez from 06/30/18. Discussed chemotherapy regimen and length and when to expect surgery. Received verbal understanding. Denies further questions or needs at this time.

## 2018-07-05 ENCOUNTER — Ambulatory Visit (HOSPITAL_BASED_OUTPATIENT_CLINIC_OR_DEPARTMENT_OTHER)
Admission: RE | Admit: 2018-07-05 | Discharge: 2018-07-05 | Disposition: A | Discharge status: Home / Self Care | Payer: 59 | Source: Ambulatory Visit | Attending: General Surgery | Admitting: General Surgery

## 2018-07-05 ENCOUNTER — Ambulatory Visit (HOSPITAL_BASED_OUTPATIENT_CLINIC_OR_DEPARTMENT_OTHER): Payer: 59 | Admitting: Anesthesiology

## 2018-07-05 ENCOUNTER — Encounter (HOSPITAL_BASED_OUTPATIENT_CLINIC_OR_DEPARTMENT_OTHER): Payer: Self-pay

## 2018-07-05 ENCOUNTER — Telehealth: Payer: Self-pay | Admitting: Hematology and Oncology

## 2018-07-05 ENCOUNTER — Ambulatory Visit (HOSPITAL_COMMUNITY): Payer: 59

## 2018-07-05 ENCOUNTER — Encounter (HOSPITAL_BASED_OUTPATIENT_CLINIC_OR_DEPARTMENT_OTHER)
Admission: RE | Disposition: A | Discharge status: Home / Self Care | Payer: Self-pay | Source: Ambulatory Visit | Attending: General Surgery

## 2018-07-05 ENCOUNTER — Other Ambulatory Visit: Payer: Self-pay

## 2018-07-05 ENCOUNTER — Telehealth: Payer: Self-pay

## 2018-07-05 DIAGNOSIS — Z419 Encounter for procedure for purposes other than remedying health state, unspecified: Secondary | ICD-10-CM

## 2018-07-05 DIAGNOSIS — Z452 Encounter for adjustment and management of vascular access device: Secondary | ICD-10-CM | POA: Diagnosis not present

## 2018-07-05 DIAGNOSIS — Z95828 Presence of other vascular implants and grafts: Secondary | ICD-10-CM

## 2018-07-05 DIAGNOSIS — Z4682 Encounter for fitting and adjustment of non-vascular catheter: Secondary | ICD-10-CM | POA: Diagnosis not present

## 2018-07-05 DIAGNOSIS — K219 Gastro-esophageal reflux disease without esophagitis: Secondary | ICD-10-CM | POA: Diagnosis not present

## 2018-07-05 DIAGNOSIS — C50911 Malignant neoplasm of unspecified site of right female breast: Secondary | ICD-10-CM | POA: Diagnosis not present

## 2018-07-05 DIAGNOSIS — C50411 Malignant neoplasm of upper-outer quadrant of right female breast: Secondary | ICD-10-CM | POA: Diagnosis not present

## 2018-07-05 HISTORY — PX: PORTACATH PLACEMENT: SHX2246

## 2018-07-05 SURGERY — INSERTION, TUNNELED CENTRAL VENOUS DEVICE, WITH PORT
Anesthesia: General | Site: Chest

## 2018-07-05 MED ORDER — FENTANYL CITRATE (PF) 100 MCG/2ML IJ SOLN
50.0000 ug | INTRAMUSCULAR | Status: AC | PRN
  Administered 2018-07-05: 25 ug via INTRAVENOUS
  Administered 2018-07-05: 100 ug via INTRAVENOUS
  Administered 2018-07-05: 25 ug via INTRAVENOUS

## 2018-07-05 MED ORDER — FENTANYL CITRATE (PF) 100 MCG/2ML IJ SOLN
INTRAMUSCULAR | Status: AC
  Filled 2018-07-05: qty 2

## 2018-07-05 MED ORDER — ONDANSETRON HCL 4 MG/2ML IJ SOLN
INTRAMUSCULAR | Status: AC
  Filled 2018-07-05: qty 2

## 2018-07-05 MED ORDER — HEPARIN SOD (PORK) LOCK FLUSH 100 UNIT/ML IV SOLN
INTRAVENOUS | Status: DC | PRN
  Administered 2018-07-05: 500 [IU]

## 2018-07-05 MED ORDER — MIDAZOLAM HCL 2 MG/2ML IJ SOLN
INTRAMUSCULAR | Status: AC
  Filled 2018-07-05: qty 2

## 2018-07-05 MED ORDER — HYDROCODONE-ACETAMINOPHEN 5-325 MG PO TABS: 1.0000 | ORAL_TABLET | Freq: Four times a day (QID) | ORAL | 0 refills | Status: DC | PRN

## 2018-07-05 MED ORDER — FENTANYL CITRATE (PF) 100 MCG/2ML IJ SOLN: 25.0000 ug | INTRAMUSCULAR | Status: DC | PRN

## 2018-07-05 MED ORDER — BUPIVACAINE-EPINEPHRINE 0.25% -1:200000 IJ SOLN
INTRAMUSCULAR | Status: DC | PRN
  Administered 2018-07-05: 16 mL

## 2018-07-05 MED ORDER — MEPERIDINE HCL 25 MG/ML IJ SOLN: 6.2500 mg | INTRAMUSCULAR | Status: DC | PRN

## 2018-07-05 MED ORDER — PROMETHAZINE HCL 25 MG/ML IJ SOLN: 6.2500 mg | INTRAMUSCULAR | Status: DC | PRN

## 2018-07-05 MED ORDER — PROPOFOL 10 MG/ML IV BOLUS
INTRAVENOUS | Status: DC | PRN
  Administered 2018-07-05: 140 mg via INTRAVENOUS

## 2018-07-05 MED ORDER — SCOPOLAMINE 1 MG/3DAYS TD PT72: 1.0000 | MEDICATED_PATCH | Freq: Once | TRANSDERMAL | Status: DC | PRN

## 2018-07-05 MED ORDER — HEPARIN SOD (PORK) LOCK FLUSH 100 UNIT/ML IV SOLN
INTRAVENOUS | Status: AC
  Filled 2018-07-05: qty 5

## 2018-07-05 MED ORDER — LIDOCAINE HCL (PF) 1 % IJ SOLN
INTRAMUSCULAR | Status: AC
  Filled 2018-07-05: qty 30

## 2018-07-05 MED ORDER — OXYCODONE HCL 5 MG/5ML PO SOLN: 5.0000 mg | Freq: Once | ORAL | Status: DC | PRN

## 2018-07-05 MED ORDER — OXYCODONE HCL 5 MG PO TABS: 5.0000 mg | ORAL_TABLET | Freq: Once | ORAL | Status: DC | PRN

## 2018-07-05 MED ORDER — DEXAMETHASONE SODIUM PHOSPHATE 10 MG/ML IJ SOLN
INTRAMUSCULAR | Status: AC
  Filled 2018-07-05: qty 1

## 2018-07-05 MED ORDER — LACTATED RINGERS IV SOLN
INTRAVENOUS | Status: DC
  Administered 2018-07-05 (×2): via INTRAVENOUS

## 2018-07-05 MED ORDER — CEFAZOLIN SODIUM-DEXTROSE 2-4 GM/100ML-% IV SOLN
INTRAVENOUS | Status: AC
  Filled 2018-07-05: qty 100

## 2018-07-05 MED ORDER — HEPARIN (PORCINE) IN NACL 2-0.9 UNITS/ML
INTRAMUSCULAR | Status: AC | PRN
  Administered 2018-07-05: 1

## 2018-07-05 MED ORDER — LIDOCAINE HCL (CARDIAC) PF 100 MG/5ML IV SOSY
PREFILLED_SYRINGE | INTRAVENOUS | Status: DC | PRN
  Administered 2018-07-05: 100 mg via INTRAVENOUS

## 2018-07-05 MED ORDER — BUPIVACAINE-EPINEPHRINE (PF) 0.25% -1:200000 IJ SOLN
INTRAMUSCULAR | Status: AC
  Filled 2018-07-05: qty 30

## 2018-07-05 MED ORDER — DEXAMETHASONE SODIUM PHOSPHATE 4 MG/ML IJ SOLN
INTRAMUSCULAR | Status: DC | PRN
  Administered 2018-07-05: 10 mg via INTRAVENOUS

## 2018-07-05 MED ORDER — HEPARIN (PORCINE) IN NACL 1000-0.9 UT/500ML-% IV SOLN
INTRAVENOUS | Status: AC
  Filled 2018-07-05: qty 500

## 2018-07-05 MED ORDER — MIDAZOLAM HCL 2 MG/2ML IJ SOLN
1.0000 mg | INTRAMUSCULAR | Status: DC | PRN
  Administered 2018-07-05: 2 mg via INTRAVENOUS

## 2018-07-05 MED ORDER — CEFAZOLIN SODIUM-DEXTROSE 2-3 GM-%(50ML) IV SOLR
INTRAVENOUS | Status: DC | PRN
  Administered 2018-07-05: 2 g via INTRAVENOUS

## 2018-07-05 SURGICAL SUPPLY — 43 items
BAG DECANTER FOR FLEXI CONT (MISCELLANEOUS) ×2 IMPLANT
BANDAGE ADH SHEER 1  50/CT (GAUZE/BANDAGES/DRESSINGS) IMPLANT
BLADE SURG 15 STRL LF DISP TIS (BLADE) ×1 IMPLANT
BLADE SURG 15 STRL SS (BLADE) ×1
CHLORAPREP W/TINT 26ML (MISCELLANEOUS) ×2 IMPLANT
COVER BACK TABLE 60X90IN (DRAPES) ×2 IMPLANT
COVER MAYO STAND STRL (DRAPES) ×2 IMPLANT
DECANTER SPIKE VIAL GLASS SM (MISCELLANEOUS) IMPLANT
DERMABOND ADVANCED (GAUZE/BANDAGES/DRESSINGS) ×1
DERMABOND ADVANCED .7 DNX12 (GAUZE/BANDAGES/DRESSINGS) ×1 IMPLANT
DRAPE C-ARM 42X72 X-RAY (DRAPES) ×2 IMPLANT
DRAPE LAPAROTOMY T 102X78X121 (DRAPES) ×2 IMPLANT
DRAPE UTILITY XL STRL (DRAPES) ×2 IMPLANT
ELECT REM PT RETURN 9FT ADLT (ELECTROSURGICAL) ×2
ELECTRODE REM PT RTRN 9FT ADLT (ELECTROSURGICAL) ×1 IMPLANT
GLOVE BIO SURGEON STRL SZ 6.5 (GLOVE) ×2 IMPLANT
GLOVE BIOGEL PI IND STRL 7.0 (GLOVE) ×1 IMPLANT
GLOVE BIOGEL PI IND STRL 8 (GLOVE) ×1 IMPLANT
GLOVE BIOGEL PI INDICATOR 7.0 (GLOVE) ×1
GLOVE BIOGEL PI INDICATOR 8 (GLOVE) ×1
GLOVE ECLIPSE 7.5 STRL STRAW (GLOVE) ×2 IMPLANT
GLOVE SURG SS PI 7.0 STRL IVOR (GLOVE) ×2 IMPLANT
GOWN STRL REUS W/ TWL LRG LVL3 (GOWN DISPOSABLE) ×2 IMPLANT
GOWN STRL REUS W/ TWL XL LVL3 (GOWN DISPOSABLE) ×1 IMPLANT
GOWN STRL REUS W/TWL LRG LVL3 (GOWN DISPOSABLE) ×2
GOWN STRL REUS W/TWL XL LVL3 (GOWN DISPOSABLE) ×1
IV CATH PLACEMENT UNIT 16 GA (IV SOLUTION) IMPLANT
IV KIT MINILOC 20X1 SAFETY (NEEDLE) IMPLANT
KIT PORT POWER 8FR ISP CVUE (Port) ×2 IMPLANT
NEEDLE HYPO 22GX1.5 SAFETY (NEEDLE) ×2 IMPLANT
NEEDLE HYPO 25X1 1.5 SAFETY (NEEDLE) ×2 IMPLANT
PACK BASIN DAY SURGERY FS (CUSTOM PROCEDURE TRAY) ×2 IMPLANT
PENCIL BUTTON HOLSTER BLD 10FT (ELECTRODE) ×2 IMPLANT
SET SHEATH INTRODUCER 10FR (MISCELLANEOUS) IMPLANT
SHEATH COOK PEEL AWAY SET 9F (SHEATH) IMPLANT
SLEEVE SCD COMPRESS KNEE MED (MISCELLANEOUS) ×2 IMPLANT
SUT MON AB 4-0 PC3 18 (SUTURE) ×2 IMPLANT
SUT PROLENE 2 0 CT2 30 (SUTURE) ×2 IMPLANT
SUT SILK 4 0 TIES 17X18 (SUTURE) IMPLANT
SYR 5ML LUER SLIP (SYRINGE) ×2 IMPLANT
SYR CONTROL 10ML LL (SYRINGE) ×2 IMPLANT
TOWEL GREEN STERILE FF (TOWEL DISPOSABLE) ×2 IMPLANT
TOWEL OR NON WOVEN STRL DISP B (DISPOSABLE) ×2 IMPLANT

## 2018-07-05 NOTE — Anesthesia Procedure Notes (Signed)
Procedure Name: LMA Insertion Date/Time: 07/05/2018 2:20 PM Performed by: Maryella Shivers, CRNA Pre-anesthesia Checklist: Patient identified, Emergency Drugs available, Suction available and Patient being monitored Patient Re-evaluated:Patient Re-evaluated prior to induction Oxygen Delivery Method: Circle system utilized Preoxygenation: Pre-oxygenation with 100% oxygen Induction Type: IV induction Ventilation: Mask ventilation without difficulty LMA: LMA inserted LMA Size: 4.0 Number of attempts: 1 Airway Equipment and Method: Bite block Placement Confirmation: positive ETCO2 Tube secured with: Tape Dental Injury: Teeth and Oropharynx as per pre-operative assessment

## 2018-07-05 NOTE — Interval H&P Note (Signed)
History and Physical Interval Note:  07/05/2018 2:00 PM  Tara Rodriguez  has presented today for surgery, with the diagnosis of BREAST CANCER  The various methods of treatment have been discussed with the patient and family. After consideration of risks, benefits and other options for treatment, the patient has consented to  Procedure(s): INSERTION PORT-A-CATH (N/A) as a surgical intervention .  The patient's history has been reviewed, patient examined, no change in status, stable for surgery.  I have reviewed the patient's chart and labs.  Questions were answered to the patient's satisfaction.     Darene Lamer Stamatia Masri

## 2018-07-05 NOTE — Op Note (Signed)
Preoperative diagnosis: Cancer of the breast and  poor venous access  Postoperative diagnosis: Same  Procedure: Placement of ClearVue subcutaneous venous port with US guidance  Surgeon: Excell Seltzer M.D.  Anesthesia: LMA general  Description of procedure: Patient is brought to the operating room and placed in the supine position on the operating table. LMA general anesthesia was administered. The entire upper chest and neck were widely sterilely prepped and draped. [place] was used to visualize the left internal jugular vein. Local anesthesia was used to infiltrate the insertion of and port sites. The left internal jugular vein was cannulated under continuous ultrasound visualization with a needle and guidewire without difficulty and position in the superior vena cava was confirmed by fluoroscopy. The introducer was then placed over the guidewire and the flushed catheter placed via the introducer which was stripped away and the tip of the catheter positioned near the cavoatrial junction. Venous pressure backflow was confirmed in the catheter. A small transverse incision was made in the anterior chest wall and subcutaneous pocket created. The catheter was tunneled into the pocket, trimmed to length, and attached to the flushed port which was positioned in the pocket. The port was sutured to the chest wall with interrupted 2-0 Prolene. The incisions were closed with subcutaneous interrupted Monocryl and the skin incisions closed with subcuticular Monocryl and Dermabond. The port was accessed and flushed and aspirated easily and was left flushed with concentrated heparin solution. Sponge needle as the counts were correct. The patient was taken to recovery in good condition.  Darene Lamer Isabellarose Kope  07/05/2018

## 2018-07-05 NOTE — Discharge Instructions (Signed)
PORT-A-CATH: POST OP INSTRUCTIONS  Always review your discharge instruction sheet given to you by the facility where your surgery was performed.   1. A prescription for pain medication may be given to you upon discharge. Take your pain medication as prescribed, if needed. If narcotic pain medicine is not needed, then you make take acetaminophen (Tylenol) or ibuprofen (Advil) as needed.  2. Take your usually prescribed medications unless otherwise directed. 3. If you need a refill on your pain medication, please contact our office. All narcotic pain medicine now requires a paper prescription.  Phoned in and fax refills are no longer allowed by law.  Prescriptions will not be filled after 5 pm or on weekends.  4. You should follow a light diet for the remainder of the day after your procedure. 5. Most patients will experience some mild swelling and/or bruising in the area of the incision. It may take several days to resolve. 6. It is common to experience some constipation if taking pain medication after surgery. Increasing fluid intake and taking a stool softener (such as Colace) will usually help or prevent this problem from occurring. A mild laxative (Milk of Magnesia or Miralax) should be taken according to package directions if there are no bowel movements after 48 hours.  7. Unless discharge instructions indicate otherwise, you may remove your bandages 48 hours after surgery, and you may shower at that time. You may have steri-strips (small white skin tapes) in place directly over the incision.  These strips should be left on the skin for 7-10 days.  If your surgeon used Dermabond (skin glue) on the incision, you may shower in 24 hours.  The glue will flake off over the next 2-3 weeks.  8. If your port is left accessed at the end of surgery (needle left in port), the dressing cannot get wet and should only by changed by a healthcare professional. When the port is no longer accessed (when the  needle has been removed), follow step 7.   9. ACTIVITIES:  Limit activity involving your arms for the next 72 hours. Do no strenuous exercise or activity for 1 week. You may drive when you are no longer taking prescription pain medication, you can comfortably wear a seatbelt, and you can maneuver your car. 10.You may need to see your doctor in the office for a follow-up appointment.  Please       check with your doctor.  11.When you receive a new Port-a-Cath, you will get a product guide and        ID card.  Please keep them in case you need them.  May return to work in 1 to 2 days as tolerated with no heavy lifting over 10 pounds or any direct pressure against chest for 1 week.  WHEN TO CALL YOUR DOCTOR [phone]): 1. Fever over 101.0 2. Chills 3. Continued bleeding from incision 4. Increased redness and tenderness at the site 5. Shortness of breath, difficulty breathing   The clinic staff is available to answer your questions during regular business hours. Please dont hesitate to call and ask to speak to one of the nurses or medical assistants for clinical concerns. If you have a medical emergency, go to the nearest emergency room or call 911.  A surgeon from [facility] is always on call at the hospital.     For further information, please visit [email or web address]   Post Anesthesia Home Care Instructions  Activity: Get plenty of rest for the  remainder of the day. A responsible individual must stay with you for 24 hours following the procedure.  For the next 24 hours, DO NOT: -Drive a car -Paediatric nurse -Drink alcoholic beverages -Take any medication unless instructed by your physician -Make any legal decisions or sign important papers.  Meals: Start with liquid foods such as gelatin or soup. Progress to regular foods as tolerated. Avoid greasy, spicy, heavy foods. If nausea and/or vomiting occur, drink only clear liquids until the nausea and/or  vomiting subsides. Call your physician if vomiting continues.  Special Instructions/Symptoms: Your throat may feel dry or sore from the anesthesia or the breathing tube placed in your throat during surgery. If this causes discomfort, gargle with warm salt water. The discomfort should disappear within 24 hours.  If you had a scopolamine patch placed behind your ear for the management of post- operative nausea and/or vomiting:  1. The medication in the patch is effective for 72 hours, after which it should be removed.  Wrap patch in a tissue and discard in the trash. Wash hands thoroughly with soap and water. 2. You may remove the patch earlier than 72 hours if you experience unpleasant side effects which may include dry mouth, dizziness or visual disturbances. 3. Avoid touching the patch. Wash your hands with soap and water after contact with the patch.

## 2018-07-05 NOTE — Anesthesia Preprocedure Evaluation (Signed)
Anesthesia Evaluation  Patient identified by MRN, date of birth, ID band Patient awake    Reviewed: Allergy & Precautions, NPO status , Patient's Chart, lab work & pertinent test results  Airway Mallampati: II  TM Distance: >3 FB Neck ROM: Full    Dental no notable dental hx.    Pulmonary neg pulmonary ROS,    Pulmonary exam normal breath sounds clear to auscultation       Cardiovascular negative cardio ROS Normal cardiovascular exam Rhythm:Regular Rate:Normal     Neuro/Psych  Headaches, negative psych ROS   GI/Hepatic negative GI ROS, Neg liver ROS,   Endo/Other  negative endocrine ROS  Renal/GU negative Renal ROS     Musculoskeletal negative musculoskeletal ROS (+)   Abdominal   Peds  Hematology  (+) anemia ,   Anesthesia Other Findings   Reproductive/Obstetrics negative OB ROS                             Anesthesia Physical Anesthesia Plan  ASA: II  Anesthesia Plan: General   Post-op Pain Management:    Induction: Intravenous  PONV Risk Score and Plan: 3 and Ondansetron and Dexamethasone  Airway Management Planned: LMA  Additional Equipment:   Intra-op Plan:   Post-operative Plan: Extubation in OR  Informed Consent: I have reviewed the patients History and Physical, chart, labs and discussed the procedure including the risks, benefits and alternatives for the proposed anesthesia with the patient or authorized representative who has indicated his/her understanding and acceptance.   Dental advisory given  Plan Discussed with: CRNA  Anesthesia Plan Comments:         Anesthesia Quick Evaluation

## 2018-07-05 NOTE — Telephone Encounter (Signed)
Returned patient's call regarding lump on right arm.  Patient states, "I have this lump on the back of my right arm."  Patient denies any redness, drainage, pain, or streaking present.  Lump has been present for many months per patient.  Patient concerned d/t recent diagnoses.  Patient with upcoming scans and MD appointments, assured patient provider will assess area at upcoming appointment.  Therapeutic communication provided.  Patient thankful, and voiced agreement to have provider assess at upcoming appointment.  No further needs at this time.

## 2018-07-05 NOTE — H&P (Signed)
History of Present Illness Tara Rodriguez T. Tara Samonte MD; 06/30/2018 11:16 AM) The patient is a 47 year old female who presents with breast cancer. She is a postmenopausal (S/P TAH/BSO) female referred by Dr. Melanee Spry for evaluation of recently diagnosed carcinoma of the right breast. she recently discovered a mass in her upper outer right breast about a month ago.She was referred for workup.. Subsequent imaging included diagnostic mamogram showing an oval mass in the posterior upper outer breast and ultrasound showing and oval hypo-to anechoic mass at the 10:00 position right breast measuring 4.1 x 3.8cm. Axilla was negative. initially aspiration was attempted but no significant amount of fluid obtained. An ultrasound guided breast biopsy was performed on 06/23/2018 with pathology revealing nvasive ductal carcinoma of the breast. She is seen now in [facility] for initial treatment planning. She does not have a personal history of any previous breast problems.  Findings at that time were the following: Tumor size: 4.1 cm Tumor grade: 3 Estrogen Receptor: negative Progesterone Receptor: egative Her-2 neu: negative Lymph node status: negative    Past Surgical History Tara Pummel, RN; 06/30/2018 7:34 AM) Colon Polyp Removal - Colonoscopy  Hysterectomy (due to cancer) - Complete   Diagnostic Studies History Tara Pummel, RN; 06/30/2018 7:34 AM) Colonoscopy  5-10 years ago Mammogram  within last year Pap Smear  1-5 years ago  Medication History Tara Pummel, RN; 06/30/2018 7:34 AM) Medications Reconciled  Social History Tara Pummel, RN; 06/30/2018 7:34 AM) Alcohol use  Occasional alcohol use. Caffeine use  Carbonated beverages, Coffee, Tea. No drug use  Tobacco use  Never smoker.  Family History Tara Pummel, RN; 06/30/2018 7:34 AM) Cancer  Sister. Diabetes Mellitus  Mother.  Pregnancy / Birth History Tara Pummel, RN; 06/30/2018 7:34 AM) Tara Rodriguez  4 Para  4  Other  Problems Tara Pummel, RN; 06/30/2018 7:34 AM) Gastroesophageal Reflux Disease     Review of Systems Tara Rodriguez Ledford RN; 06/30/2018 7:34 AM) General Not Present- Appetite Loss, Chills, Fatigue, Fever, Night Sweats, Weight Gain and Weight Loss. Skin Not Present- Change in Wart/Mole, Dryness, Hives, Jaundice, New Lesions, Non-Healing Wounds, Rash and Ulcer. HEENT Not Present- Earache, Hearing Loss, Hoarseness, Nose Bleed, Oral Ulcers, Ringing in the Ears, Seasonal Allergies, Sinus Pain, Sore Throat, Visual Disturbances, Wears glasses/contact lenses and Yellow Eyes. Respiratory Not Present- Bloody sputum, Chronic Cough, Difficulty Breathing, Snoring and Wheezing. Breast Not Present- Breast Mass, Breast Pain, Nipple Discharge and Skin Changes. Cardiovascular Not Present- Chest Pain, Difficulty Breathing Lying Down, Leg Cramps, Palpitations, Rapid Heart Rate, Shortness of Breath and Swelling of Extremities. Gastrointestinal Not Present- Abdominal Pain, Bloating, Bloody Stool, Change in Bowel Habits, Chronic diarrhea, Constipation, Difficulty Swallowing, Excessive gas, Gets full quickly at meals, Hemorrhoids, Indigestion, Nausea, Rectal Pain and Vomiting. Musculoskeletal Present- Back Pain. Not Present- Joint Pain, Joint Stiffness, Muscle Pain, Muscle Weakness and Swelling of Extremities. Neurological Not Present- Decreased Memory, Fainting, Headaches, Numbness, Seizures, Tingling, Tremor, Trouble walking and Weakness. Psychiatric Not Present- Anxiety, Bipolar, Change in Sleep Pattern, Depression, Fearful and Frequent crying. Endocrine Not Present- Cold Intolerance, Excessive Hunger, Hair Changes, Heat Intolerance, Hot flashes and New Diabetes. Hematology Not Present- Blood Thinners, Easy Bruising, Excessive bleeding, Gland problems, HIV and Persistent Infections.   Physical Exam Tara Rodriguez T. Tara Okeefe MD; 06/30/2018 11:17 AM) The physical exam findings are as follows: Note:General: Alert,  well-developed and well nourished African-American female, in no distress Skin: Warm and dry without rash or infection. HEENT: No palpable masses or thyromegaly. Sclera nonicteric. Pupils equal round and reactive. Lymph nodes:  No cervical, supraclavicular, nodes palpable. breasts: Dense breast tissue laterally but there is a somewhat indistinct firm freely movable mass measuring at least 4 cm in the upper outer right breast. No other palpable abnormalities in either breast. No skin changes or nipple discharge or crusting. Lungs: Breath sounds clear and equal. No wheezing or increased work of breathing. Cardiovascular: Regular rate and rhythm without murmer. No JVD or edema. Abdomen: Nondistended. Soft and nontender. No masses palpable. No organomegaly. No palpable hernias. Extremities: No edema or joint swelling or deformity. No chronic venous stasis changes. Neurologic: Alert and fully oriented. Gait normal. No focal weakness. Psychiatric: Normal mood and affect. Thought content appropriate with normal judgement and insight    Assessment & Plan Tara Rodriguez T. Tara Pieroni MD; 06/30/2018 11:23 AM) MALIGNANT NEOPLASM OF UPPER-OUTER QUADRANT OF RIGHT BREAST IN FEMALE, ESTROGEN RECEPTOR NEGATIVE (C50.411) Impression: 47 year old female with a new diagnosis of cancer of the right breast, upper outer quadrant. Clinical stage IIB, triple negative. I discussed the diagnosis and treatment options with the patient and her daughter. As she has a somewhat large triple negative cancer I believe she would benefit from neoadjuvant chemotherapy to reduce her tumor size for subsequent lumpectomy. This would also give [place] prognostic information and allow time to process genetic testing. We also discussed that ultimately after chemotherapy she would be a good candidate for lumpectomy and sentinel lymph node biopsy with breast conservation which is what she would prefer at this point. Her decision of course could be altered by  genetic testing or other findings after MRI. we discussed Port-A-Cath placement. I discussed the nature of the procedure, indications, and expected recovery. I discussed risks of anesthetic complications, bleeding, infection, pneumothorax, catheter displacement or occlusion and risk of DVT. She understands and agrees to proceed. Current Plans Port-A-Cath placement under general anesthesia as an outpatient  You are being scheduled for surgery- Our schedulers will call you.

## 2018-07-05 NOTE — Transfer of Care (Signed)
Immediate Anesthesia Transfer of Care Note  Patient: Tara Rodriguez  Procedure(s) Performed: INSERTION PORT-A-CATH (N/A Chest)  Patient Location: PACU  Anesthesia Type:General  Level of Consciousness: sedated  Airway & Oxygen Therapy: Patient Spontanous Breathing and Patient connected to face mask oxygen  Post-op Assessment: Report given to RN and Post -op Vital signs reviewed and stable  Post vital signs: Reviewed and stable  Last Vitals:  Vitals Value Taken Time  BP 93/68 07/05/2018  3:24 PM  Temp    Pulse 70 07/05/2018  3:28 PM  Resp 14 07/05/2018  3:28 PM  SpO2 100 % 07/05/2018  3:28 PM  Vitals shown include unvalidated device data.  Last Pain:  Vitals:   07/05/18 1152  TempSrc: Oral  PainSc: 0-No pain         Complications: No apparent anesthesia complications

## 2018-07-05 NOTE — Telephone Encounter (Signed)
Spoke to patient regarding upcoming appts per 8/2 sch message

## 2018-07-06 ENCOUNTER — Encounter (HOSPITAL_BASED_OUTPATIENT_CLINIC_OR_DEPARTMENT_OTHER): Payer: Self-pay | Admitting: General Surgery

## 2018-07-06 MED ORDER — ONDANSETRON HCL 4 MG/2ML IJ SOLN
INTRAMUSCULAR | Status: DC | PRN
  Administered 2018-07-05: 4 mg via INTRAVENOUS

## 2018-07-06 NOTE — Anesthesia Postprocedure Evaluation (Signed)
Anesthesia Post Note  Patient: Tara Rodriguez  Procedure(s) Performed: INSERTION PORT-A-CATH (N/A Chest)     Patient location during evaluation: PACU Anesthesia Type: General Level of consciousness: sedated and patient cooperative Pain management: pain level controlled Vital Signs Assessment: post-procedure vital signs reviewed and stable Respiratory status: spontaneous breathing Cardiovascular status: stable Anesthetic complications: no    Last Vitals:  Vitals:   07/05/18 1645 07/05/18 1700  BP: (!) 145/98 (!) 152/98  Pulse: 70 76  Resp: 15 16  Temp:  36.6 C  SpO2: 99% 100%    Last Pain:  Vitals:   07/06/18 1146  TempSrc:   PainSc: [place]

## 2018-07-07 ENCOUNTER — Ambulatory Visit (HOSPITAL_COMMUNITY)
Admission: RE | Admit: 2018-07-07 | Discharge: 2018-07-07 | Disposition: A | Discharge status: Home / Self Care | Payer: 59 | Source: Ambulatory Visit | Attending: Hematology and Oncology | Admitting: Hematology and Oncology

## 2018-07-07 DIAGNOSIS — C50411 Malignant neoplasm of upper-outer quadrant of right female breast: Secondary | ICD-10-CM | POA: Diagnosis not present

## 2018-07-07 DIAGNOSIS — Z171 Estrogen receptor negative status [ER-]: Secondary | ICD-10-CM

## 2018-07-07 DIAGNOSIS — C50911 Malignant neoplasm of unspecified site of right female breast: Secondary | ICD-10-CM | POA: Diagnosis not present

## 2018-07-07 MED ORDER — GADOBENATE DIMEGLUMINE 529 MG/ML IV SOLN
20.0000 mL | Freq: Once | INTRAVENOUS | Status: AC | PRN
  Administered 2018-07-07: 18 mL via INTRAVENOUS

## 2018-07-08 ENCOUNTER — Other Ambulatory Visit: Payer: Self-pay | Admitting: Hematology and Oncology

## 2018-07-08 ENCOUNTER — Inpatient Hospital Stay: Payer: 59 | Attending: Genetic Counselor | Admitting: Licensed Clinical Social Worker

## 2018-07-08 ENCOUNTER — Encounter: Payer: Self-pay | Admitting: Licensed Clinical Social Worker

## 2018-07-08 ENCOUNTER — Telehealth: Payer: Self-pay | Admitting: *Deleted

## 2018-07-08 ENCOUNTER — Encounter: Payer: Self-pay | Admitting: *Deleted

## 2018-07-08 ENCOUNTER — Inpatient Hospital Stay (HOSPITAL_COMMUNITY): Admission: RE | Admit: 2018-07-08 | Payer: 59 | Source: Ambulatory Visit

## 2018-07-08 ENCOUNTER — Inpatient Hospital Stay: Payer: 59

## 2018-07-08 DIAGNOSIS — Z803 Family history of malignant neoplasm of breast: Secondary | ICD-10-CM | POA: Diagnosis not present

## 2018-07-08 DIAGNOSIS — Z800 Family history of malignant neoplasm of digestive organs: Secondary | ICD-10-CM | POA: Insufficient documentation

## 2018-07-08 DIAGNOSIS — C50411 Malignant neoplasm of upper-outer quadrant of right female breast: Secondary | ICD-10-CM | POA: Insufficient documentation

## 2018-07-08 DIAGNOSIS — Z006 Encounter for examination for normal comparison and control in clinical research program: Secondary | ICD-10-CM

## 2018-07-08 DIAGNOSIS — Z171 Estrogen receptor negative status [ER-]: Secondary | ICD-10-CM

## 2018-07-08 DIAGNOSIS — Z1379 Encounter for other screening for genetic and chromosomal anomalies: Secondary | ICD-10-CM

## 2018-07-08 DIAGNOSIS — Z79899 Other long term (current) drug therapy: Secondary | ICD-10-CM | POA: Insufficient documentation

## 2018-07-08 DIAGNOSIS — Z5111 Encounter for antineoplastic chemotherapy: Secondary | ICD-10-CM | POA: Insufficient documentation

## 2018-07-08 NOTE — Progress Notes (Signed)
[place] Work  Clinical Social Work was referred by Art therapist for transportation assistance.  Clinical Social Worker contacted patient by phone  to offer support and assess for needs.  Ms. Siracusa stated she does not have transportation needs at this time.  Her daughter can bring her tomorrow and her other daughter can bring her Monday.  Her daughters will bring in [facility] paperwork so they can bring patient in the future.  CSW encouraged patient to call CSW back if she needs transportation resources.      Gwinda Maine, LCSW  Clinical Social Worker [facility]

## 2018-07-08 NOTE — Telephone Encounter (Signed)
Received notification for chemo ed nurse that pt has transportation issues and will not be able to make appts today.  Called pt and informed I reached out to SW to see if we can arrange transportation for later appts and appts that need to be scheduled. Discussed with pt to have daughter get FMLA paperwork from employer HR department to help with transportation. Received verbal understanding. Msg given to [place] regarding needs for transportation. Appts for echo and chemo class have been r/s. Left pt detailed msg regarding these appts.

## 2018-07-08 NOTE — Progress Notes (Signed)
REFERRING PROVIDER: Nicholas Lose, MD [address] [place], [place] [address]   PRIMARY PROVIDER:  Carlena Hurl, PA-C  PRIMARY REASON FOR VISIT:  1. Malignant neoplasm of upper-outer quadrant of right breast in female, estrogen receptor negative ([facility])   2. Family history of breast cancer   3. Family history of colon cancer      HISTORY OF PRESENT ILLNESS:   Tara Rodriguez, a 47 y.o. female, was seen for a [facility] cancer genetics consultation at the request of Dr. Lindi Adie due to a personal and family history of cancer.  Tara Rodriguez presents to clinic today to discuss the possibility of a hereditary predisposition to cancer, genetic testing, and to further clarify her future cancer risks, as well as potential cancer risks for family members.   In 2019, at the age of 45, Tara Rodriguez was diagnosed with cancer of the right breast, triple negative. The current treatment plan is neoadjuvant chemotherapy followed by surgery.   CANCER HISTORY:    Malignant neoplasm of upper-outer quadrant of right breast in female, estrogen receptor negative ([facility])   06/23/2018 Initial Diagnosis    Palpable lump in the right breast for 2 months UOQ by mammogram and ultrasound cystic and solid lesion at 10 o'clock position measuring 4.1 cm, axilla negative, biopsy revealed IDC grade 3 triple negative with a Ki-67 of 70%, T2 N0 stage IIb clinical stage    06/30/2018 -  Chemotherapy    The patient had DOXOrubicin (ADRIAMYCIN) chemo injection 128 mg, 60 mg/m2 = 128 mg, Intravenous,  Once, 0 of 4 cycles palonosetron (ALOXI) injection 0.25 mg, 0.25 mg, Intravenous,  Once, 0 of 8 cycles pegfilgrastim-cbqv (UDENYCA) injection 6 mg, 6 mg, Subcutaneous, Once, 0 of 4 cycles CARBOplatin (PARAPLATIN) 700 mg in sodium chloride 0.9 % 250 mL chemo infusion, 700 mg (100 % of original dose 700 mg), Intravenous,  Once, 0 of 4 cycles Dose modification: 700 mg (original dose 700 mg, Cycle 5) cyclophosphamide (CYTOXAN) 1,280  mg in sodium chloride 0.9 % 250 mL chemo infusion, 600 mg/m2 = 1,280 mg, Intravenous,  Once, 0 of 4 cycles PACLitaxel (TAXOL) 168 mg in sodium chloride 0.9 % 250 mL chemo infusion (</= '80mg'$ /m2), 80 mg/m2 = 168 mg, Intravenous,  Once, 0 of 4 cycles fosaprepitant (EMEND) 150 mg, dexamethasone (DECADRON) 12 mg in sodium chloride 0.9 % 145 mL IVPB, , Intravenous,  Once, 0 of 8 cycles  for chemotherapy treatment.       HORMONAL RISK FACTORS:  Menarche was at age unknown.  First live birth at age 35.  OCP use for approximately 1 year. Ovaries intact: no.  Hysterectomy: yes.  Menopausal status: postmenopausal.  HRT use: 0 years. Colonoscopy: yes; normal. Mammogram within the last year: yes. Number of breast biopsies: 1   Past Medical History:  Diagnosis Date  . Acrochordon    skin, left lower breast  . Allergy   . Anemia    history of  . Blood transfusion without reported diagnosis    during hysterectomy  . Family history of breast cancer   . Family history of colon cancer   . Migraine   . S/P hysterectomy    hx/o fibroids  . Wears glasses     Past Surgical History:  Procedure Laterality Date  . ABDOMINAL HYSTERECTOMY  2009   total, due to fibroids  . COLONOSCOPY  2009   due to anemia  . PORTACATH PLACEMENT N/A 07/05/2018   Procedure: INSERTION PORT-A-CATH;  Surgeon: Excell Seltzer, MD;  Location: [facility];  Service: General;  Laterality: N/A;    Social History   Socioeconomic History  . Marital status: Single    Spouse name: Not on file  . Number of children: Not on file  . Years of education: Not on file  . Highest education level: Not on file  Occupational History  . Not on file  Social Needs  . Financial resource strain: Not on file  . Food insecurity:    Worry: Not on file    Inability: Not on file  . Transportation needs:    Medical: Not on file    Non-medical: Not on file  Tobacco Use  . Smoking status: Never Smoker  . Smokeless  tobacco: Never Used  Substance and Sexual Activity  . Alcohol use: No  . Drug use: No  . Sexual activity: Not on file  Lifestyle  . Physical activity:    Days per week: Not on file    Minutes per session: Not on file  . Stress: Not on file  Relationships  . Social connections:    Talks on phone: Not on file    Gets together: Not on file    Attends religious service: Not on file    Active member of club or organization: Not on file    Attends meetings of clubs or organizations: Not on file    Relationship status: Not on file  Other Topics Concern  . 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   05/2018     FAMILY HISTORY:  We obtained a detailed, 4-generation family history.  Significant diagnoses are listed below: Family History  Problem Relation Age of Onset  . Heart disease Mother        pacemaker  . Diabetes Mother   . Arthritis Mother   . Alcohol abuse Father   . Breast cancer Sister        2s  . Breast cancer Maternal Grandmother        dx 35, dx 61s  . Stroke Maternal Grandfather   . Colon cancer Paternal Grandmother   . Uterine cancer Paternal Grandmother   . Cancer Paternal Grandmother        unknown 3rd cancer  . Hypertension Neg Hx   . Hyperlipidemia Neg Hx     Tara Rodriguez has four children. Three daughters ages 20, 41 and 61 and a son age 56. She has 9 grandchildren. Tara Rodriguez has one full sister in her late 15s who was diagnosed with breast cancer previously at unknown age. Tara Rodriguez does not know if she had genetic testing. Tara Rodriguez has a half brother through her dad, and a half sister who passed away through her dad.   Tara Rodriguez father is 5, no cancer history. Tara Rodriguez has two paternal aunts and two paternal uncles. No cancer history for these individuals, and no cancer in her paternal cousins. Her paternal grandmother was diagnosed with colon, uterine, and a  third unknown cancer. Tara Rodriguez. Politte believes these were three separate cancers and does not know the ages of diagnosis or age of death. Tara Rodriguez paternal grandfather did not have cancer.  Tara Rodriguez mother is 42, no cancer history. Tara Rodriguez has a maternal uncle, Pilar Plate, who accompanied her to the session, and a maternal aunt who died at 20. Tara Rodriguez has 6 maternal cousins with no cancer history. Tara Rodriguez.  Rodriguez's maternal grandmother had breast cancer diagnosed at 42 and again in her late 63s. She passed away at 86. Tara Rodriguez. Blankenbeckler maternal grandfather died at 81.   Tara Rodriguez is unaware of previous family history of genetic testing for hereditary cancer risks. Patient's maternal ancestors are of African American descent, and paternal ancestors are of African American descent. There is no reported Ashkenazi Jewish ancestry. There is no known consanguinity.  GENETIC COUNSELING ASSESSMENT: Tara Rodriguez is a 47 y.o. female with a personal and family history which is somewhat suggestive of a Hereditary Cancer Predisposition Syndrome. We, therefore, discussed and recommended the following at today's visit.   DISCUSSION: We reviewed the characteristics, features and inheritance patterns of hereditary cancer syndromes. We also discussed genetic testing, including the appropriate family members to test, the process of testing, insurance coverage and turn-around-time for results. We discussed the implications of a negative, positive and/or variant of uncertain significant result. We recommended Tara Rodriguez pursue genetic testing for the [facility] Common Hereditary Cancers gene panel.   The Common Hereditary Cancer Panel offered by [facility] includes sequencing and/or deletion duplication testing of the following 47 genes: APC, ATM, AXIN2, BARD1, BMPR1A, BRCA1, BRCA2, BRIP1, CDH1, CDKN2A (p14ARF), CDKN2A (p16INK4a), CKD4, CHEK2, CTNNA1, DICER1, EPCAM (Deletion/duplication testing only), GREM1 (promoter region deletion/duplication testing  only), KIT, MEN1, MLH1, MSH2, MSH3, MSH6, MUTYH, NBN, NF1, NHTL1, PALB2, PDGFRA, PMS2, POLD1, POLE, PTEN, RAD50, RAD51C, RAD51D, SDHB, SDHC, SDHD, SMAD4, SMARCA4. STK11, TP53, TSC1, TSC2, and VHL.  The following genes were evaluated for sequence changes only: SDHA and HOXB13 c.251G>A variant only.  We discussed that only 5-10% of cancers are associated with a Hereditary cancer predisposition syndrome.  One of the most common hereditary cancer syndromes that increases breast cancer risk is called Hereditary Breast and Ovarian Cancer (HBOC) syndrome.  This syndrome is caused by mutations in the BRCA1 and BRCA2 genes.  This syndrome increases an individual's lifetime risk to develop breast, ovarian, pancreatic, and other types of cancer.  There are also many other cancer predisposition syndromes caused by mutations in several other genes.  We discussed that if she is found to have a mutation in one of these genes, it may impact surgical decisions, and alter future medical management recommendations such as increased cancer screenings and consideration of risk reducing surgeries.  A positive result could also have implications for the patient's family members.  A Negative result would mean we were unable to identify a hereditary component to her cancer, but does not rule out the possibility of a hereditary basis for her cancer.  There could be mutations that are undetectable by current technology, or in genes not yet tested or identified to increase cancer risk.    We discussed the potential to find a Variant of Uncertain Significance or VUS.  These are variants that have not yet been identified as pathogenic or benign, and it is unknown if this variant is associated with increased cancer risk or if this is a normal finding.  Most VUS's are reclassified to benign or likely benign.   It should not be used to make medical management decisions. With time, we suspect the lab will determine the significance of any  VUS's identified if any.   Based on Tara Rodriguez. Buehl's personal and family history of cancer, she meets medical criteria for genetic testing. Despite that she meets criteria, she may still have an out of pocket cost. The lab will provide Tara Rodriguez with the out of pocket cost.  PLAN: After considering the risks, benefits, and limitations, Tara Rodriguez. Piontek  provided informed consent to pursue genetic testing and the blood sample was sent to [facility] for analysis of the Common Hereditary Cancers Panel. Results should be available within approximately 2-3 weeks' time, at which point they will be disclosed by telephone to Tara Rodriguez. Murph, as will any additional recommendations warranted by these results. Tara Rodriguez. Hazelbaker will receive a summary of her genetic counseling visit and a copy of her results once available. This information will also be available in Epic. We encouraged Tara Rodriguez. Matton to remain in contact with cancer genetics annually so that we can continuously update the family history and inform her of any changes in cancer genetics and testing that may be of benefit for her family. Tara Rodriguez. Mclaurin questions were answered to her satisfaction today. Our contact information was provided should additional questions or concerns arise.  Based on Tara Rodriguez. Jue's family history, we recommended her sister have genetic counseling and testing. Tara Rodriguez. Chiem will let us know if we can be of any assistance in coordinating genetic counseling and/or testing for this family member.   Lastly, we encouraged Tara Rodriguez. Huston to remain in contact with cancer genetics annually so that we can continuously update the family history and inform her of any changes in cancer genetics and testing that may be of benefit for this family.   Tara Rodriguez.  [place] questions were answered to her satisfaction today. Our contact information was provided should additional questions or concerns arise. Thank you for the referral and allowing [place] to share in the care of your patient.   Epimenio Foot, Tara Rodriguez Genetic Counselor [email or web address] Phone: [phone]  The patient was seen for a total of 40 minutes in face-to-face genetic counseling.  The patient was accompanied today by her uncle, Pilar Plate. This patient was discussed with Drs. Magrinat, Lindi Adie and/or Burr Medico who agrees with the above.

## 2018-07-09 ENCOUNTER — Ambulatory Visit (HOSPITAL_COMMUNITY): Admission: RE | Admit: 2018-07-09 | Payer: 59 | Source: Ambulatory Visit

## 2018-07-09 ENCOUNTER — Inpatient Hospital Stay: Payer: 59

## 2018-07-09 ENCOUNTER — Encounter: Payer: Self-pay | Admitting: Medical

## 2018-07-09 ENCOUNTER — Encounter: Payer: Self-pay | Admitting: *Deleted

## 2018-07-09 ENCOUNTER — Ambulatory Visit (HOSPITAL_COMMUNITY)
Admission: RE | Admit: 2018-07-09 | Discharge: 2018-07-09 | Disposition: A | Discharge status: Home / Self Care | Payer: 59 | Source: Ambulatory Visit | Attending: Hematology and Oncology | Admitting: Hematology and Oncology

## 2018-07-09 DIAGNOSIS — C50411 Malignant neoplasm of upper-outer quadrant of right female breast: Secondary | ICD-10-CM | POA: Diagnosis not present

## 2018-07-09 DIAGNOSIS — Z171 Estrogen receptor negative status [ER-]: Secondary | ICD-10-CM

## 2018-07-09 NOTE — Progress Notes (Signed)
  Echocardiogram 2D Echocardiogram has been performed.  Bobbye Charleston 07/09/2018, 11:18 AM

## 2018-07-12 ENCOUNTER — Telehealth: Payer: Self-pay | Admitting: Hematology and Oncology

## 2018-07-12 ENCOUNTER — Inpatient Hospital Stay: Payer: 59

## 2018-07-12 ENCOUNTER — Encounter: Payer: Self-pay | Admitting: Hematology and Oncology

## 2018-07-12 ENCOUNTER — Other Ambulatory Visit: Payer: Self-pay | Admitting: *Deleted

## 2018-07-12 ENCOUNTER — Inpatient Hospital Stay (HOSPITAL_BASED_OUTPATIENT_CLINIC_OR_DEPARTMENT_OTHER): Payer: 59 | Admitting: Hematology and Oncology

## 2018-07-12 DIAGNOSIS — Z79899 Other long term (current) drug therapy: Secondary | ICD-10-CM | POA: Diagnosis not present

## 2018-07-12 DIAGNOSIS — Z171 Estrogen receptor negative status [ER-]: Secondary | ICD-10-CM

## 2018-07-12 DIAGNOSIS — C50411 Malignant neoplasm of upper-outer quadrant of right female breast: Secondary | ICD-10-CM

## 2018-07-12 DIAGNOSIS — Z95828 Presence of other vascular implants and grafts: Secondary | ICD-10-CM | POA: Insufficient documentation

## 2018-07-12 DIAGNOSIS — Z803 Family history of malignant neoplasm of breast: Secondary | ICD-10-CM | POA: Diagnosis not present

## 2018-07-12 DIAGNOSIS — Z5111 Encounter for antineoplastic chemotherapy: Secondary | ICD-10-CM | POA: Diagnosis not present

## 2018-07-12 LAB — CBC WITH DIFFERENTIAL (CANCER CENTER ONLY)
BASOS ABS: 0 10*3/uL (ref 0.0–0.1)
BASOS PCT: 0 %
Eosinophils Absolute: 0.1 10*3/uL (ref 0.0–0.5)
Eosinophils Relative: 2 %
HEMATOCRIT: 36.6 % (ref 34.8–46.6)
Hemoglobin: 12 g/dL (ref 11.6–15.9)
Lymphocytes Relative: 57 %
Lymphs Abs: 4.2 10*3/uL — ABNORMAL HIGH (ref 0.9–3.3)
MCH: 28.8 pg (ref 25.1–34.0)
MCHC: 32.8 g/dL (ref 31.5–36.0)
MCV: 87.8 fL (ref 79.5–101.0)
MONO ABS: 0.5 10*3/uL (ref 0.1–0.9)
Monocytes Relative: 7 %
NEUTROS ABS: 2.6 10*3/uL (ref 1.5–6.5)
NEUTROS PCT: 34 %
PLATELETS: 235 10*3/uL (ref 145–400)
RBC: 4.17 MIL/uL (ref 3.70–5.45)
RDW: 14 % (ref 11.2–14.5)
WBC: 7.5 10*3/uL (ref 3.9–10.3)

## 2018-07-12 LAB — CMP (CANCER CENTER ONLY)
ALK PHOS: 78 U/L (ref 38–126)
ALT: 20 U/L (ref 0–44)
ANION GAP: 8 (ref 5–15)
AST: 14 U/L — ABNORMAL LOW (ref 15–41)
Albumin: 3.4 g/dL — ABNORMAL LOW (ref 3.5–5.0)
BILIRUBIN TOTAL: 0.3 mg/dL (ref 0.3–1.2)
BUN: 9 mg/dL (ref 6–20)
CALCIUM: 8.1 mg/dL — AB (ref 8.9–10.3)
CO2: 24 mmol/L (ref 22–32)
Chloride: 110 mmol/L (ref 98–111)
Creatinine: 0.77 mg/dL (ref 0.44–1.00)
GFR, Estimated: >60 mL/min (ref >60)
GLUCOSE: 94 mg/dL (ref 70–99)
Potassium: 3.5 mmol/L (ref 3.5–5.1)
Sodium: 142 mmol/L (ref 135–145)
Total Protein: 6.4 g/dL — ABNORMAL LOW (ref 6.5–8.1)

## 2018-07-12 MED ORDER — PALONOSETRON HCL INJECTION 0.25 MG/5ML
0.2500 mg | Freq: Once | INTRAVENOUS | Status: AC
  Administered 2018-07-12: 0.25 mg via INTRAVENOUS

## 2018-07-12 MED ORDER — ONDANSETRON HCL 8 MG PO TABS: 8.0000 mg | ORAL_TABLET | Freq: Two times a day (BID) | ORAL | 1 refills | Status: DC | PRN

## 2018-07-12 MED ORDER — PROCHLORPERAZINE MALEATE 10 MG PO TABS: 10.0000 mg | ORAL_TABLET | Freq: Four times a day (QID) | ORAL | 1 refills | Status: DC | PRN

## 2018-07-12 MED ORDER — SODIUM CHLORIDE 0.9% FLUSH
10.0000 mL | INTRAVENOUS | Status: DC | PRN
  Administered 2018-07-12: 10 mL
  Filled 2018-07-12: qty 10

## 2018-07-12 MED ORDER — PALONOSETRON HCL INJECTION 0.25 MG/5ML
INTRAVENOUS | Status: AC
  Filled 2018-07-12: qty 5

## 2018-07-12 MED ORDER — HEPARIN SOD (PORK) LOCK FLUSH 100 UNIT/ML IV SOLN
500.0000 [IU] | Freq: Once | INTRAVENOUS | Status: AC | PRN
  Administered 2018-07-12: 500 [IU]
  Filled 2018-07-12: qty 5

## 2018-07-12 MED ORDER — SODIUM CHLORIDE 0.9 % IV SOLN
600.0000 mg/m2 | Freq: Once | INTRAVENOUS | Status: AC
  Administered 2018-07-12: 1280 mg via INTRAVENOUS
  Filled 2018-07-12: qty 64

## 2018-07-12 MED ORDER — FOSAPREPITANT DIMEGLUMINE INJECTION 150 MG
Freq: Once | INTRAVENOUS | Status: AC
  Administered 2018-07-12: 12:00:00 via INTRAVENOUS
  Filled 2018-07-12: qty 5

## 2018-07-12 MED ORDER — LIDOCAINE-PRILOCAINE 2.5-2.5 % EX CREA: TOPICAL_CREAM | CUTANEOUS | 3 refills | Status: DC

## 2018-07-12 MED ORDER — SODIUM CHLORIDE 0.9 % IV SOLN
Freq: Once | INTRAVENOUS | Status: AC
  Administered 2018-07-12: 12:00:00 via INTRAVENOUS
  Filled 2018-07-12: qty 250

## 2018-07-12 MED ORDER — DOXORUBICIN HCL CHEMO IV INJECTION 2 MG/ML
60.0000 mg/m2 | Freq: Once | INTRAVENOUS | Status: AC
  Administered 2018-07-12: 128 mg via INTRAVENOUS
  Filled 2018-07-12: qty 64

## 2018-07-12 MED FILL — LORazepam 0.5 MG TABS: 0.5 | 30 days supply | Qty: 30 | Fill #0

## 2018-07-12 NOTE — Progress Notes (Signed)
Met with patient referred by navigator with financial concerns related to OOP medication cost. Advised patient she may apply for the one-time $1000 grant that may cover the cost of medications related to diagnosis if outpatient pharmacy is used. Gave patient verbal income amount and she states she makes less. Patient advised to bring proof of income as soon as possible to apply for the grant. Also advised that [facility] currently has funds available to help with copay assistance for treatment drugs if insurance leaves her with a balance as well as out of pocket oral medication related to diagnosis. Advised her proof of income is required to apply for that fund as well and the funds could run out quickly. She states she can have her daughter bring her recent 60 today or tomorrow to apply. She has my card for any additional questions or concerns.

## 2018-07-12 NOTE — Patient Instructions (Addendum)
[facility] Discharge Instructions for Patients Receiving Chemotherapy  Today you received the following chemotherapy agents Adriamycin and Cytoxan.  To help prevent nausea and vomiting after your treatment, we encourage you to take your nausea medication as directed.  If you develop nausea and vomiting that is not controlled by your nausea medication, call the clinic.   BELOW ARE SYMPTOMS THAT SHOULD BE REPORTED IMMEDIATELY:  *FEVER GREATER THAN 100.5 F  *CHILLS WITH OR WITHOUT FEVER  NAUSEA AND VOMITING THAT IS NOT CONTROLLED WITH YOUR NAUSEA MEDICATION  *UNUSUAL SHORTNESS OF BREATH  *UNUSUAL BRUISING OR BLEEDING  TENDERNESS IN MOUTH AND THROAT WITH OR WITHOUT PRESENCE OF ULCERS  *URINARY PROBLEMS  *BOWEL PROBLEMS  UNUSUAL RASH Items with * indicate a potential emergency and should be followed up as soon as possible.  Feel free to call the clinic should you have any questions or concerns. The clinic phone number is [phone].  Please show the [facility] at check-in to the Emergency Department and triage nurse.  Doxorubicin injection (Adriamycin) What is this medicine? DOXORUBICIN (dox oh ROO bi sin) is a chemotherapy drug. It is used to treat many kinds of cancer like leukemia, lymphoma, neuroblastoma, sarcoma, and Wilms' tumor. It is also used to treat bladder cancer, breast cancer, lung cancer, ovarian cancer, stomach cancer, and thyroid cancer. This medicine may be used for other purposes; ask your health care provider or pharmacist if you have questions. COMMON BRAND NAME(S): Adriamycin, Adriamycin PFS, Adriamycin RDF, Rubex What should I tell my health care provider before I take this medicine? They need to know if you have any of these conditions: -heart disease -history of low blood counts caused by a medicine -liver disease -recent or ongoing radiation therapy -an unusual or allergic reaction to doxorubicin, other chemotherapy agents,  other medicines, foods, dyes, or preservatives -pregnant or trying to get pregnant -breast-feeding How should I use this medicine? This drug is given as an infusion into a vein. It is administered in a hospital or clinic by a specially trained health care professional. If you have pain, swelling, burning or any unusual feeling around the site of your injection, tell your health care professional right away. Talk to your pediatrician regarding the use of this medicine in children. Special care may be needed. Overdosage: If you think you have taken too much of this medicine contact a poison control center or emergency room at once. NOTE: This medicine is only for you. Do not share this medicine with others. What if I miss a dose? It is important not to miss your dose. Call your doctor or health care professional if you are unable to keep an appointment. What may interact with this medicine? This medicine may interact with the following medications: -6-mercaptopurine -paclitaxel -phenytoin -St. John's Wort -trastuzumab -verapamil This list may not describe all possible interactions. Give your health care provider a list of all the medicines, herbs, non-prescription drugs, or dietary supplements you use. Also tell them if you smoke, drink alcohol, or use illegal drugs. Some items may interact with your medicine. What should I watch for while using this medicine? This drug may make you feel generally unwell. This is not uncommon, as chemotherapy can affect healthy cells as well as cancer cells. Report any side effects. Continue your course of treatment even though you feel ill unless your doctor tells you to stop. There is a maximum amount of this medicine you should receive throughout your life. The amount depends on  the medical condition being treated and your overall health. Your doctor will watch how much of this medicine you receive in your lifetime. Tell your doctor if you have taken this  medicine before. You may need blood work done while you are taking this medicine. Your urine may turn red for a few days after your dose. This is not blood. If your urine is dark or brown, call your doctor. In some cases, you may be given additional medicines to help with side effects. Follow all directions for their use. Call your doctor or health care professional for advice if you get a fever, chills or sore throat, or other symptoms of a cold or flu. Do not treat yourself. This drug decreases your body's ability to fight infections. Try to avoid being around people who are sick. This medicine may increase your risk to bruise or bleed. Call your doctor or health care professional if you notice any unusual bleeding. Talk to your doctor about your risk of cancer. You may be more at risk for certain types of cancers if you take this medicine. Do not become pregnant while taking this medicine or for 6 months after stopping it. Women should inform their doctor if they wish to become pregnant or think they might be pregnant. Men should not father a child while taking this medicine and for 6 months after stopping it. There is a potential for serious side effects to an unborn child. Talk to your health care professional or pharmacist for more information. Do not breast-feed an infant while taking this medicine. This medicine has caused ovarian failure in some women and reduced sperm counts in some men This medicine may interfere with the ability to have a child. Talk with your doctor or health care professional if you are concerned about your fertility. What side effects may I notice from receiving this medicine? Side effects that you should report to your doctor or health care professional as soon as possible: -allergic reactions like skin rash, itching or hives, swelling of the face, lips, or tongue -breathing problems -chest pain -fast or irregular heartbeat -low blood counts - this medicine may  decrease the number of white blood cells, red blood cells and platelets. You may be at increased risk for infections and bleeding. -pain, redness, or irritation at site where injected -signs of infection - fever or chills, cough, sore throat, pain or difficulty passing urine -signs of decreased platelets or bleeding - bruising, pinpoint red spots on the skin, black, tarry stools, blood in the urine -swelling of the ankles, feet, hands -tiredness -weakness Side effects that usually do not require medical attention (report to your doctor or health care professional if they continue or are bothersome): -diarrhea -hair loss -mouth sores -nail discoloration or damage -nausea -red colored urine -vomiting This list may not describe all possible side effects. Call your doctor for medical advice about side effects. You may report side effects to FDA at 1-800-FDA-1088. Where should I keep my medicine? This drug is given in a hospital or clinic and will not be stored at home. NOTE: This sheet is a summary. It may not cover all possible information. If you have questions about this medicine, talk to your doctor, pharmacist, or health care provider.  2018 [employer] (2016-01-14 11:28:51)  Cyclophosphamide injection (Cytoxan) What is this medicine? CYCLOPHOSPHAMIDE (sye kloe FOSS fa mide) is a chemotherapy drug. It slows the growth of cancer cells. This medicine is used to treat many types of cancer like  lymphoma, myeloma, leukemia, breast cancer, and ovarian cancer, to name a few. This medicine may be used for other purposes; ask your health care provider or pharmacist if you have questions. COMMON BRAND NAME(S): Cytoxan, Neosar What should I tell my health care provider before I take this medicine? They need to know if you have any of these conditions: -blood disorders -history of other chemotherapy -infection -kidney disease -liver disease -recent or ongoing radiation  therapy -tumors in the bone marrow -an unusual or allergic reaction to cyclophosphamide, other chemotherapy, other medicines, foods, dyes, or preservatives -pregnant or trying to get pregnant -breast-feeding How should I use this medicine? This drug is usually given as an injection into a vein or muscle or by infusion into a vein. It is administered in a hospital or clinic by a specially trained health care professional. Talk to your pediatrician regarding the use of this medicine in children. Special care may be needed. Overdosage: If you think you have taken too much of this medicine contact a poison control center or emergency room at once. NOTE: This medicine is only for you. Do not share this medicine with others. What if I miss a dose? It is important not to miss your dose. Call your doctor or health care professional if you are unable to keep an appointment. What may interact with this medicine? This medicine may interact with the following medications: -amiodarone -amphotericin B -azathioprine -certain antiviral medicines for HIV or AIDS such as protease inhibitors (e.g., indinavir, ritonavir) and zidovudine -certain blood pressure medications such as benazepril, captopril, enalapril, fosinopril, lisinopril, moexipril, monopril, perindopril, quinapril, ramipril, trandolapril -certain cancer medications such as anthracyclines (e.g., daunorubicin, doxorubicin), busulfan, cytarabine, paclitaxel, pentostatin, tamoxifen, trastuzumab -certain diuretics such as chlorothiazide, chlorthalidone, hydrochlorothiazide, indapamide, metolazone -certain medicines that treat or prevent blood clots like warfarin -certain muscle relaxants such as succinylcholine -cyclosporine -etanercept -indomethacin -medicines to increase blood counts like filgrastim, pegfilgrastim, sargramostim -medicines used as general anesthesia -metronidazole -natalizumab This list may not describe all possible  interactions. Give your health care provider a list of all the medicines, herbs, non-prescription drugs, or dietary supplements you use. Also tell them if you smoke, drink alcohol, or use illegal drugs. Some items may interact with your medicine. What should I watch for while using this medicine? Visit your doctor for checks on your progress. This drug may make you feel generally unwell. This is not uncommon, as chemotherapy can affect healthy cells as well as cancer cells. Report any side effects. Continue your course of treatment even though you feel ill unless your doctor tells you to stop. Drink water or other fluids as directed. Urinate often, even at night. In some cases, you may be given additional medicines to help with side effects. Follow all directions for their use. Call your doctor or health care professional for advice if you get a fever, chills or sore throat, or other symptoms of a cold or flu. Do not treat yourself. This drug decreases your body's ability to fight infections. Try to avoid being around people who are sick. This medicine may increase your risk to bruise or bleed. Call your doctor or health care professional if you notice any unusual bleeding. Be careful brushing and flossing your teeth or using a toothpick because you may get an infection or bleed more easily. If you have any dental work done, tell your dentist you are receiving this medicine. You may get drowsy or dizzy. Do not drive, use machinery, or do anything that needs mental  alertness until you know how this medicine affects you. Do not become pregnant while taking this medicine or for 1 year after stopping it. Women should inform their doctor if they wish to become pregnant or think they might be pregnant. Men should not father a child while taking this medicine and for 4 months after stopping it. There is a potential for serious side effects to an unborn child. Talk to your health care professional or pharmacist for  more information. Do not breast-feed an infant while taking this medicine. This medicine may interfere with the ability to have a child. This medicine has caused ovarian failure in some women. This medicine has caused reduced sperm counts in some men. You should talk with your doctor or health care professional if you are concerned about your fertility. If you are going to have surgery, tell your doctor or health care professional that you have taken this medicine. What side effects may I notice from receiving this medicine? Side effects that you should report to your doctor or health care professional as soon as possible: -allergic reactions like skin rash, itching or hives, swelling of the face, lips, or tongue -low blood counts - this medicine may decrease the number of white blood cells, red blood cells and platelets. You may be at increased risk for infections and bleeding. -signs of infection - fever or chills, cough, sore throat, pain or difficulty passing urine -signs of decreased platelets or bleeding - bruising, pinpoint red spots on the skin, black, tarry stools, blood in the urine -signs of decreased red blood cells - unusually weak or tired, fainting spells, lightheadedness -breathing problems -dark urine -dizziness -palpitations -swelling of the ankles, feet, hands -trouble passing urine or change in the amount of urine -weight gain -yellowing of the eyes or skin Side effects that usually do not require medical attention (report to your doctor or health care professional if they continue or are bothersome): -changes in nail or skin color -hair loss -missed menstrual periods -mouth sores -nausea, vomiting This list may not describe all possible side effects. Call your doctor for medical advice about side effects. You may report side effects to FDA at 1-800-FDA-1088. Where should I keep my medicine? This drug is given in a hospital or clinic and will not be stored at  home. NOTE: This sheet is a summary. It may not cover all possible information. If you have questions about this medicine, talk to your doctor, pharmacist, or health care provider.  2018 [employer] (2012-10-01 16:22:58)

## 2018-07-12 NOTE — Patient Instructions (Signed)
Implanted Port Home Guide An implanted port is a type of central line that is placed under the skin. Central lines are used to provide IV access when treatment or nutrition needs to be given through a person's veins. Implanted ports are used for long-term IV access. An implanted port may be placed because:  You need IV medicine that would be irritating to the small veins in your hands or arms.  You need long-term IV medicines, such as antibiotics.  You need IV nutrition for a long period.  You need frequent blood draws for lab tests.  You need dialysis.  Implanted ports are usually placed in the chest area, but they can also be placed in the upper arm, the abdomen, or the leg. An implanted port has two main parts:  Reservoir. The reservoir is round and will appear as a small, raised area under your skin. The reservoir is the part where a needle is inserted to give medicines or draw blood.  Catheter. The catheter is a thin, flexible tube that extends from the reservoir. The catheter is placed into a large vein. Medicine that is inserted into the reservoir goes into the catheter and then into the vein.  How will I care for my incision site? Do not get the incision site wet. Bathe or shower as directed by your health care provider. How is my port accessed? Special steps must be taken to access the port:  Before the port is accessed, a numbing cream can be placed on the skin. This helps numb the skin over the port site.  Your health care provider uses a sterile technique to access the port. ? Your health care provider must put on a mask and sterile gloves. ? The skin over your port is cleaned carefully with an antiseptic and allowed to dry. ? The port is gently pinched between sterile gloves, and a needle is inserted into the port.  Only "non-coring" port needles should be used to access the port. Once the port is accessed, a blood return should be checked. This helps ensure that the port  is in the vein and is not clogged.  If your port needs to remain accessed for a constant infusion, a clear (transparent) bandage will be placed over the needle site. The bandage and needle will need to be changed every week, or as directed by your health care provider.  Keep the bandage covering the needle clean and dry. Do not get it wet. Follow your health care provider's instructions on how to take a shower or bath while the port is accessed.  If your port does not need to stay accessed, no bandage is needed over the port.  What is flushing? Flushing helps keep the port from getting clogged. Follow your health care provider's instructions on how and when to flush the port. Ports are usually flushed with saline solution or a medicine called heparin. The need for flushing will depend on how the port is used.  If the port is used for intermittent medicines or blood draws, the port will need to be flushed: ? After medicines have been given. ? After blood has been drawn. ? As part of routine maintenance.  If a constant infusion is running, the port may not need to be flushed.  How long will my port stay implanted? The port can stay in for as long as your health care provider thinks it is needed. When it is time for the port to come out, surgery will be   done to remove it. The procedure is similar to the one performed when the port was put in. When should I seek immediate medical care? When you have an implanted port, you should seek immediate medical care if:  You notice a bad smell coming from the incision site.  You have swelling, redness, or drainage at the incision site.  You have more swelling or pain at the port site or the surrounding area.  You have a fever that is not controlled with medicine.  This information is not intended to replace advice given to you by your health care provider. Make sure you discuss any questions you have with your health care provider. Document  Released: 11/17/2005 Document Revised: 04/24/2016 Document Reviewed: 07/25/2013 [employer] Interactive Patient Education  2017 [employer]  

## 2018-07-12 NOTE — Progress Notes (Signed)
Patient Care Team: Tysinger, Camelia Eng, PA-C as PCP - General (Family Medicine) Excell Seltzer, MD as Consulting Physician (General Surgery) Nicholas Lose, MD as Consulting Physician (Hematology and Oncology) Gery Pray, MD as Consulting Physician (Radiation Oncology)  DIAGNOSIS:  Encounter Diagnosis  Name Primary?  . 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  Her port is in place and appears to be healing well.  She is anxious to begin her treatment.  REVIEW OF SYSTEMS:   Constitutional: Denies fevers, chills or abnormal weight loss Eyes: Denies blurriness of vision Ears, nose, mouth, throat, and face: Denies mucositis or sore throat Respiratory: Denies cough, dyspnea or wheezes Cardiovascular: Denies palpitation, chest discomfort Gastrointestinal:  Denies nausea, heartburn or change in bowel habits Skin: Denies abnormal skin rashes Lymphatics: Denies new lymphadenopathy or easy bruising Neurological:Denies numbness, tingling or new weaknesses Behavioral/Psych: Mood is stable, no new changes  Extremities: No lower extremity  edema   All other systems were reviewed with the patient and are negative.  I have reviewed the past medical history, past surgical history, social history and family history with the patient and they are unchanged from previous note.  ALLERGIES:  has No Known Allergies.  MEDICATIONS:  Current Outpatient Medications  Medication Sig Dispense Refill  . acetaminophen (TYLENOL) 500 MG tablet Take 500 mg by mouth every 6 (six) hours as needed.    Marland Kitchen HYDROcodone-acetaminophen (NORCO/VICODIN) 5-325 MG tablet Take 1 tablet by mouth every 6 (six) hours as needed for moderate pain or severe pain. 10 tablet 0  . lidocaine-prilocaine (EMLA) cream Apply to affected area once 30 g 3  . LORazepam (ATIVAN) 0.5 MG tablet Take 1 tablet (0.5 mg total) by mouth at bedtime as needed for sleep. 30 tablet 0  . naproxen sodium (ALEVE) 220 MG tablet Take 220 mg by mouth as needed.    . ondansetron (ZOFRAN) 8 MG tablet Take 1 tablet (8 mg total) by mouth 2 (two) times daily as needed. Start on the third day after chemotherapy. 30 tablet 1  . prochlorperazine (COMPAZINE) 10 MG tablet Take 1 tablet (10 mg total) by mouth every 6 (six) hours as needed (Nausea or vomiting). 30 tablet 1  . vitamin E 100 UNIT capsule Take by mouth as needed.     No current facility-administered medications for this visit.    Facility-Administered Medications Ordered in Other Visits  Medication Dose Route Frequency Provider Last Rate Last Dose  . cyclophosphamide (CYTOXAN) 1,280 mg in sodium chloride 0.9 % 250 mL chemo infusion  600 mg/m2 (Treatment Plan Recorded) Intravenous Once Nicholas Lose, MD      .  DOXOrubicin (ADRIAMYCIN) chemo injection 128 mg  60 mg/m2 (Treatment Plan Recorded) Intravenous Once Nicholas Lose, MD      . fosaprepitant (EMEND) 150 mg, dexamethasone (DECADRON) 12 mg in sodium chloride 0.9 % 145 mL IVPB   Intravenous Once Nicholas Lose, MD      . heparin lock flush 100 unit/mL  500 Units Intracatheter Once PRN Nicholas Lose, MD      . sodium chloride flush (NS) 0.9 % injection 10 mL  10 mL Intracatheter PRN Nicholas Lose, MD        PHYSICAL EXAMINATION: ECOG PERFORMANCE STATUS: 1 - Symptomatic but completely ambulatory  Vitals:   07/12/18 1052  BP: (!) 148/91  Pulse: 66  Resp: 18  Temp: 98.8 F (37.1 C)  SpO2: 100%   Filed Weights   07/12/18 1052  Weight: 206 lb 9.6 oz (93.7 kg)    GENERAL:alert, no distress and comfortable SKIN: skin color, texture, turgor are normal, no rashes or significant lesions EYES: normal, Conjunctiva are pink and non-injected, sclera clear OROPHARYNX:no exudate, no erythema and lips, buccal mucosa, and tongue normal  NECK: supple, thyroid normal size, non-tender, without nodularity LYMPH:  no palpable lymphadenopathy in the cervical, axillary or inguinal LUNGS: clear to auscultation and percussion with normal breathing effort HEART: regular rate & rhythm and no murmurs and no lower extremity edema ABDOMEN:abdomen soft, non-tender and normal bowel sounds MUSCULOSKELETAL:no cyanosis of digits and no clubbing  NEURO: alert & oriented x 3 with fluent speech, no focal motor/sensory deficits EXTREMITIES: No lower extremity edema   LABORATORY DATA:  I have reviewed the data as listed CMP Latest Ref Rng & Units 07/12/2018 06/30/2018 06/21/2018  Glucose 70 - 99 mg/dL 94 154(H) 92  BUN 6 - 20 mg/dL _0 Creatinine 0.44 - 1.00 mg/dL 0.77 0.84 0.85  Sodium 135 - 145 mmol/L 142 142 141  Potassium 3.5 - 5.1 mmol/L 3.5 3.8 3.8  Chloride 98 - 111 mmol/L 110 107 104  CO2 22 - 32 mmol/L _1 Calcium 8.9 - 10.3 mg/dL 8.1(L) 9.3 9.2  Total Protein 6.5 - 8.1 g/dL 6.4(L) 7.3 7.0  Total Bilirubin 0.3 - 1.2 mg/dL 0.3 0.4 0.3  Alkaline Phos 38 - 126 U/L 78 81 81  AST 15 - 41 U/L 14(L) 14(L) 17  ALT 0 - 44 U/L _2 Lab Results  Component Value Date   WBC 7.5 07/12/2018   HGB 12.0 07/12/2018   HCT 36.6 07/12/2018   MCV 87.8 07/12/2018   PLT 235 07/12/2018    NEUTROABS 2.6 07/12/2018    ASSESSMENT & PLAN:  Malignant neoplasm of upper-outer quadrant of right breast in female, estrogen receptor negative ([place]) 06/23/2018:Palpable lump in the right breast for 2 months UOQ by mammogram and ultrasound cystic and solid lesion at 10 o'clock position measuring 4.1 cm, axilla negative, biopsy revealed IDC 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 Neoadjuvant chemotherapy with Adriamycin and Cytoxan dose dense 4 followed by Taxol and carboplatin weekly 12 2. Followed by breast conserving surgery with sentinel lymph node study   3. Followed by adjuvant radiation therapy ------------------------------------------------------------------------------ Current treatment: Cycle 1 day 1 dose dense Adriamycin and Cytoxan Labs have been reviewed Antiemetics were reviewed Echocardiogram 07/09/2018: EF 55 to 60% Chemo education completed Return to clinic in 1 week for toxicity check     No orders of the  defined types were placed in this encounter.  The patient has a good understanding of the overall plan. she agrees with it. she will call with any problems that may develop before the next visit here.   Harriette Ohara, MD 07/12/18

## 2018-07-12 NOTE — Assessment & Plan Note (Signed)
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 Neoadjuvant chemotherapy with Adriamycin and Cytoxan dose dense 4 followed by Taxol and carboplatin weekly 12 2. Followed by breast conserving surgery with sentinel lymph node study   3. Followed by adjuvant radiation therapy ------------------------------------------------------------------------------ Current treatment: Cycle 1 day 1 dose dense Adriamycin and Cytoxan Labs have been reviewed Antiemetics were reviewed Echocardiogram 07/09/2018: EF 55 to 60% Chemo education completed Return to clinic in 1 week for toxicity check

## 2018-07-12 NOTE — Telephone Encounter (Signed)
Schedule message and los for 8/12 complete. Gave patient updated avs report and appointment schedule for August/September while in infusion.

## 2018-07-13 ENCOUNTER — Encounter: Payer: Self-pay | Admitting: Hematology and Oncology

## 2018-07-13 NOTE — Progress Notes (Signed)
Received approval letter from [place] [employer] for Tara Rodriguez.  Patient approved for up to $15,000 over the next 12 months leaving patient with a $0 copay after insurance pays with the earliest claim date of 04/14/2018. Copy of approval letter will be given to Lenise to manage copay/billing charges for [facility]. Patient will receive a copy in the mail and there is nothing she needs to do on her end. Will explain when she comes in to sign [employer] application.

## 2018-07-13 NOTE — Progress Notes (Signed)
Received proof of income via fax for [name]. Patient knows to come in and sign award letter as she will be approved. Patient has my card for any additional financial questions or concerns.  Enrolled patient in [place] complete for [employer] for patient to receive leaving her with a $0 copay after insurance pays.

## 2018-07-14 ENCOUNTER — Inpatient Hospital Stay: Payer: 59

## 2018-07-14 ENCOUNTER — Encounter: Payer: Self-pay | Admitting: Hematology and Oncology

## 2018-07-14 DIAGNOSIS — Z171 Estrogen receptor negative status [ER-]: Secondary | ICD-10-CM

## 2018-07-14 DIAGNOSIS — C50411 Malignant neoplasm of upper-outer quadrant of right female breast: Secondary | ICD-10-CM | POA: Diagnosis not present

## 2018-07-14 MED ORDER — PEGFILGRASTIM-CBQV 6 MG/0.6ML ~~LOC~~ SOSY
6.0000 mg | PREFILLED_SYRINGE | Freq: Once | SUBCUTANEOUS | Status: AC
  Administered 2018-07-14: 6 mg via SUBCUTANEOUS

## 2018-07-14 MED FILL — ONDANSETRON HCL 8 MG TABLET: 8 | 15 days supply | Qty: 30 | Fill #0

## 2018-07-14 MED FILL — LIDOCAINE-PRILOCAINE CREAM: 2.5-2.5 | 30 days supply | Qty: 30 | Fill #0

## 2018-07-14 NOTE — Patient Instructions (Signed)
Pegfilgrastim injection What is this medicine? PEGFILGRASTIM (PEG fil gra stim) is a long-acting granulocyte colony-stimulating factor that stimulates the growth of neutrophils, a type of white blood cell important in the body's fight against infection. It is used to reduce the incidence of fever and infection in patients with certain types of cancer who are receiving chemotherapy that affects the bone marrow, and to increase survival after being exposed to high doses of radiation. This medicine may be used for other purposes; ask your health care provider or pharmacist if you have questions. COMMON BRAND NAME(S): Neulasta What should I tell my health care provider before I take this medicine? They need to know if you have any of these conditions: -kidney disease -latex allergy -ongoing radiation therapy -sickle cell disease -skin reactions to acrylic adhesives (On-Body Injector only) -an unusual or allergic reaction to pegfilgrastim, filgrastim, other medicines, foods, dyes, or preservatives -pregnant or trying to get pregnant -breast-feeding How should I use this medicine? This medicine is for injection under the skin. If you get this medicine at home, you will be taught how to prepare and give the pre-filled syringe or how to use the On-body Injector. Refer to the patient Instructions for Use for detailed instructions. Use exactly as directed. Tell your healthcare provider immediately if you suspect that the On-body Injector may not have performed as intended or if you suspect the use of the On-body Injector resulted in a missed or partial dose. It is important that you put your used needles and syringes in a special sharps container. Do not put them in a trash can. If you do not have a sharps container, call your pharmacist or healthcare provider to get one. Talk to your pediatrician regarding the use of this medicine in children. While this drug may be prescribed for selected conditions,  precautions do apply. Overdosage: If you think you have taken too much of this medicine contact a poison control center or emergency room at once. NOTE: This medicine is only for you. Do not share this medicine with others. What if I miss a dose? It is important not to miss your dose. Call your doctor or health care professional if you miss your dose. If you miss a dose due to an On-body Injector failure or leakage, a new dose should be administered as soon as possible using a single prefilled syringe for manual use. What may interact with this medicine? Interactions have not been studied. Give your health care provider a list of all the medicines, herbs, non-prescription drugs, or dietary supplements you use. Also tell them if you smoke, drink alcohol, or use illegal drugs. Some items may interact with your medicine. This list may not describe all possible interactions. Give your health care provider a list of all the medicines, herbs, non-prescription drugs, or dietary supplements you use. Also tell them if you smoke, drink alcohol, or use illegal drugs. Some items may interact with your medicine. What should I watch for while using this medicine? You may need blood work done while you are taking this medicine. If you are going to need a MRI, CT scan, or other procedure, tell your doctor that you are using this medicine (On-Body Injector only). What side effects may I notice from receiving this medicine? Side effects that you should report to your doctor or health care professional as soon as possible: -allergic reactions like skin rash, itching or hives, swelling of the face, lips, or tongue -dizziness -fever -pain, redness, or irritation at site   where injected -pinpoint red spots on the skin -red or dark-brown urine -shortness of breath or breathing problems -stomach or side pain, or pain at the shoulder -swelling -tiredness -trouble passing urine or change in the amount of urine Side  effects that usually do not require medical attention (report to your doctor or health care professional if they continue or are bothersome): -bone pain -muscle pain This list may not describe all possible side effects. Call your doctor for medical advice about side effects. You may report side effects to FDA at 1-800-FDA-1088. Where should I keep my medicine? Keep out of the reach of children. Store pre-filled syringes in a refrigerator between 2 and 8 degrees C (36 and 46 degrees F). Do not freeze. Keep in carton to protect from light. Throw away this medicine if it is left out of the refrigerator for more than 48 hours. Throw away any unused medicine after the expiration date. NOTE: This sheet is a summary. It may not cover all possible information. If you have questions about this medicine, talk to your doctor, pharmacist, or health care provider.  2018 Elsevier/Gold Standard (2016-11-13 12:58:03)  

## 2018-07-14 NOTE — Progress Notes (Signed)
Met with patient to sign approval for [name].  Patient approved for one-time $1000 grant. Patient has a copy of the expense sheet as well as the expenses it covers as well as outpatient pharmacy information. Explained to patient how grant works. Asked patient if she has ant prescriptions that she needs to get filled. She states she picked up one already but didn't get the others. Reviewed medication list and saw that she received others. Advised patient she may use the grant to cover her copay for her medications if she uses the [facility] OP pharmacy. She verbalized she would like to do that. Called [facility] pharmacy([name]) to have prescriptions transferred from [facility] to [facility]. Tara Rodriguez states one of her medications is already there and she will have the others transferred. Advised patient she may go to the pharmacy after her injection to pick up medication. She said she was very weak and got her appointment times confused so she sent her son home but he will be coming back to get her. Wrote the address and telephone number on the grant paper for her to give to her son to take her to get medications. She somewhat understood.  Gave her my card for any additional financial questions or concerns.

## 2018-07-15 NOTE — Progress Notes (Signed)
Completed FMLA mailed to patient address on file. Unable to fax to employer.

## 2018-07-19 ENCOUNTER — Inpatient Hospital Stay (HOSPITAL_BASED_OUTPATIENT_CLINIC_OR_DEPARTMENT_OTHER): Payer: 59 | Admitting: Hematology and Oncology

## 2018-07-19 ENCOUNTER — Encounter: Payer: Self-pay | Admitting: *Deleted

## 2018-07-19 ENCOUNTER — Other Ambulatory Visit: Payer: 59

## 2018-07-19 ENCOUNTER — Inpatient Hospital Stay: Payer: 59

## 2018-07-19 DIAGNOSIS — Z79899 Other long term (current) drug therapy: Secondary | ICD-10-CM | POA: Diagnosis not present

## 2018-07-19 DIAGNOSIS — C50411 Malignant neoplasm of upper-outer quadrant of right female breast: Secondary | ICD-10-CM

## 2018-07-19 DIAGNOSIS — Z171 Estrogen receptor negative status [ER-]: Secondary | ICD-10-CM | POA: Diagnosis not present

## 2018-07-19 LAB — CMP (CANCER CENTER ONLY)
ALBUMIN: 3.4 g/dL — AB (ref 3.5–5.0)
ALT: 58 U/L — AB (ref 0–44)
AST: 15 U/L (ref 15–41)
Alkaline Phosphatase: 127 U/L — ABNORMAL HIGH (ref 38–126)
Anion gap: 4 — ABNORMAL LOW (ref 5–15)
BUN: 8 mg/dL (ref 6–20)
CHLORIDE: 107 mmol/L (ref 98–111)
CO2: 28 mmol/L (ref 22–32)
CREATININE: 0.74 mg/dL (ref 0.44–1.00)
Calcium: 8.5 mg/dL — ABNORMAL LOW (ref 8.9–10.3)
GFR, Estimated: >60 mL/min (ref >60)
Glucose, Bld: 121 mg/dL — ABNORMAL HIGH (ref 70–99)
Potassium: 4 mmol/L (ref 3.5–5.1)
SODIUM: 139 mmol/L (ref 135–145)
Total Bilirubin: 0.4 mg/dL (ref 0.3–1.2)
Total Protein: 6.5 g/dL (ref 6.5–8.1)

## 2018-07-19 LAB — CBC WITH DIFFERENTIAL (CANCER CENTER ONLY)
Basophils Absolute: 0.1 10*3/uL (ref 0.0–0.1)
Basophils Relative: 1 %
EOS ABS: 0.2 10*3/uL (ref 0.0–0.5)
Eosinophils Relative: 3 %
HEMATOCRIT: 35.3 % (ref 34.8–46.6)
HEMOGLOBIN: 11.6 g/dL (ref 11.6–15.9)
LYMPHS ABS: 2.7 10*3/uL (ref 0.9–3.3)
Lymphocytes Relative: 45 %
MCH: 28.8 pg (ref 25.1–34.0)
MCHC: 32.9 g/dL (ref 31.5–36.0)
MCV: 87.6 fL (ref 79.5–101.0)
MONO ABS: 0.3 10*3/uL (ref 0.1–0.9)
MONOS PCT: 5 %
NEUTROS PCT: 46 %
Neutro Abs: 2.7 10*3/uL (ref 1.5–6.5)
Platelet Count: 156 10*3/uL (ref 145–400)
RBC: 4.03 MIL/uL (ref 3.70–5.45)
RDW: 13.6 % (ref 11.2–14.5)
WBC Count: 5.9 10*3/uL (ref 3.9–10.3)

## 2018-07-19 NOTE — Assessment & Plan Note (Signed)
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 Neoadjuvant chemotherapy with Adriamycin and Cytoxan dose dense 4 followed byTaxol and carboplatinweekly 12 2. Followed by breast conserving surgery with sentinel lymph node study  3. Followed by adjuvant radiation therapy ------------------------------------------------------------------------------ Current treatment: Cycle 1 day 8 dose dense Adriamycin and Cytoxan Labs have been reviewed  Chemo toxicities:   Echocardiogram 07/09/2018: EF 55 to 60%  Return to clinic in 1 week for cycle 2

## 2018-07-19 NOTE — Progress Notes (Signed)
Patient Care Team: Tysinger, Camelia Eng, PA-C as PCP - General (Family Medicine) Excell Seltzer, MD as Consulting Physician (General Surgery) Nicholas Lose, MD as Consulting Physician (Hematology and Oncology) Gery Pray, MD as Consulting Physician (Radiation Oncology)  DIAGNOSIS:  Encounter Diagnosis  Name Primary?  . 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  Today is cycle 1 day 8.  She tolerated cycle 1 extremely well.  She had mild nausea for which she took Compazine.  She did not take her Zofran.  Her biggest complaint today is profound fatigue to the point that she is been sleeping most of the day and sometimes crying.  Her family is trying to take care of her and keeping her spirits up and getting her active.  She has not been drinking enough fluids but she has been eating reasonably well.  Her taste has changed.  REVIEW OF SYSTEMS:   Constitutional: Denies fevers, chills or abnormal weight loss Eyes: Denies blurriness of vision Ears, nose, mouth, throat, and face: Denies mucositis or sore throat Respiratory: Denies cough, dyspnea or  wheezes Cardiovascular: Denies palpitation, chest discomfort Gastrointestinal: Nausea grade 1, decreased taste and appetite Skin: Denies abnormal skin rashes Lymphatics: Denies new lymphadenopathy or easy bruising Neurological:Denies numbness, tingling or new weaknesses Behavioral/Psych: Mood is stable, no new changes  Extremities: No lower extremity edema  All other systems were reviewed with the patient and are negative.  I have reviewed the past medical history, past surgical history, social history and family history with the patient and they are unchanged from previous note.  ALLERGIES:  has No Known Allergies.  MEDICATIONS:  Current Outpatient Medications  Medication Sig Dispense Refill  . acetaminophen (TYLENOL) 500 MG tablet Take 500 mg by mouth every 6 (six) hours as needed.    Marland Kitchen HYDROcodone-acetaminophen (NORCO/VICODIN) 5-325 MG tablet Take 1 tablet by mouth every 6 (six) hours as needed for moderate pain or severe pain. 10 tablet 0  . lidocaine-prilocaine (EMLA) cream Apply to affected area once 30 g 3  . LORazepam (ATIVAN) 0.5 MG tablet Take 1 tablet (0.5 mg total) by mouth at bedtime as needed for sleep. 30 tablet 0  . naproxen sodium (ALEVE) 220 MG tablet Take 220 mg by mouth as needed.    . ondansetron (ZOFRAN) 8 MG tablet Take 1 tablet (8 mg total) by mouth 2 (two) times daily as needed. Start on the third day after chemotherapy. 30 tablet 1  . prochlorperazine (COMPAZINE) 10 MG tablet Take 1 tablet (10 mg total) by mouth every 6 (six) hours as needed (Nausea or vomiting). 30 tablet 1  . vitamin E 100 UNIT  capsule Take by mouth as needed.     No current facility-administered medications for this visit.     PHYSICAL EXAMINATION: ECOG PERFORMANCE STATUS: 1 - Symptomatic but completely ambulatory  Vitals:   07/19/18 1122  BP: 121/77  Pulse: 73  Resp: 19  Temp: 98 F (36.7 C)  SpO2: 100%   Filed Weights   07/19/18 1122  Weight: 204 lb 4.8 oz (92.7 kg)     GENERAL:alert, no distress and comfortable SKIN: skin color, texture, turgor are normal, no rashes or significant lesions EYES: normal, Conjunctiva are pink and non-injected, sclera clear OROPHARYNX:no exudate, no erythema and lips, buccal mucosa, and tongue normal  NECK: supple, thyroid normal size, non-tender, without nodularity LYMPH:  no palpable lymphadenopathy in the cervical, axillary or inguinal LUNGS: clear to auscultation and percussion with normal breathing effort HEART: regular rate & rhythm and no murmurs and no lower extremity edema ABDOMEN:abdomen soft, non-tender and normal bowel sounds MUSCULOSKELETAL:no cyanosis of digits and no clubbing  NEURO: alert & oriented x 3 with fluent speech, no focal motor/sensory deficits EXTREMITIES: No lower extremity edema Alert LABORATORY DATA:  I have reviewed the data as listed CMP Latest Ref Rng & Units 07/12/2018 06/30/2018 06/21/2018  Glucose 70 - 99 mg/dL 94 154(H) 92  BUN 6 - 20 mg/dL '9 7 9  '$ Creatinine 0.44 - 1.00 mg/dL 0.77 0.84 0.85  Sodium 135 - 145 mmol/L 142 142 141  Potassium 3.5 - 5.1 mmol/L 3.5 3.8 3.8  Chloride 98 - 111 mmol/L 110 107 104  CO2 22 - 32 mmol/L '24 29 24  '$ Calcium 8.9 - 10.3 mg/dL 8.1(L) 9.3 9.2  Total Protein 6.5 - 8.1 g/dL 6.4(L) 7.3 7.0  Total Bilirubin 0.3 - 1.2 mg/dL 0.3 0.4 0.3  Alkaline Phos 38 - 126 U/L 78 81 81  AST 15 - 41 U/L 14(L) 14(L) 17  ALT 0 - 44 U/L '20 18 20    '$ Lab Results  Component Value Date   WBC 5.9 07/19/2018   HGB 11.6 07/19/2018   HCT 35.3 07/19/2018   MCV 87.6 07/19/2018   PLT 156 07/19/2018   NEUTROABS 2.7 07/19/2018    ASSESSMENT & PLAN:  Malignant neoplasm of upper-outer quadrant of right breast in female, estrogen receptor negative ([place]) 06/23/2018:Palpable lump in the right breast for 2 months UOQ by mammogram and ultrasound cystic and solid lesion at 10 o'clock position measuring 4.1 cm, axilla negative, biopsy revealed IDC grade 3 triple negative with a Ki-67  of 70%, T2 N0 stage IIb clinical stage  Recommendationbased on multidisciplinary tumor board: 1. Neoadjuvant chemotherapy with Adriamycin and Cytoxan dose dense 4 followed byTaxol and carboplatinweekly 12 2. Followed by breast conserving surgery with sentinel lymph node study  3. Followed by adjuvant radiation therapy ------------------------------------------------------------------------------ Current treatment: Cycle 1 day 8 dose dense Adriamycin and Cytoxan Labs have been reviewed  Chemo toxicities:  1.  Mild nausea 2. profound tiredness  I reviewed the blood work and it does not show any evidence of neutropenia or cytopenias. We will keep the dosage the same based on her blood work as well as symptoms.  Echocardiogram 07/09/2018: EF 55 to 60%  Return to clinic in 1 week for cycle 2    No orders of the defined types were placed in this encounter.  The patient has a good understanding of the overall plan. she agrees with it. she will call with any problems that may develop before the next visit here.   Harriette Ohara, MD  07/19/18    

## 2018-07-20 ENCOUNTER — Telehealth: Payer: Self-pay | Admitting: Hematology and Oncology

## 2018-07-20 NOTE — Telephone Encounter (Signed)
Per 8/19 los, no new orders ° °

## 2018-07-26 ENCOUNTER — Inpatient Hospital Stay: Payer: 59

## 2018-07-26 ENCOUNTER — Telehealth: Payer: Self-pay | Admitting: Licensed Clinical Social Worker

## 2018-07-26 ENCOUNTER — Encounter: Payer: Self-pay | Admitting: Licensed Clinical Social Worker

## 2018-07-26 ENCOUNTER — Inpatient Hospital Stay (HOSPITAL_BASED_OUTPATIENT_CLINIC_OR_DEPARTMENT_OTHER): Payer: 59 | Admitting: Hematology and Oncology

## 2018-07-26 ENCOUNTER — Ambulatory Visit: Payer: Self-pay | Admitting: Licensed Clinical Social Worker

## 2018-07-26 ENCOUNTER — Encounter: Payer: Self-pay | Admitting: *Deleted

## 2018-07-26 DIAGNOSIS — Z171 Estrogen receptor negative status [ER-]: Secondary | ICD-10-CM

## 2018-07-26 DIAGNOSIS — Z1211 Encounter for screening for malignant neoplasm of colon: Secondary | ICD-10-CM | POA: Insufficient documentation

## 2018-07-26 DIAGNOSIS — Z79899 Other long term (current) drug therapy: Secondary | ICD-10-CM | POA: Diagnosis not present

## 2018-07-26 DIAGNOSIS — C50411 Malignant neoplasm of upper-outer quadrant of right female breast: Secondary | ICD-10-CM | POA: Diagnosis not present

## 2018-07-26 DIAGNOSIS — Z800 Family history of malignant neoplasm of digestive organs: Secondary | ICD-10-CM

## 2018-07-26 DIAGNOSIS — Z1379 Encounter for other screening for genetic and chromosomal anomalies: Secondary | ICD-10-CM

## 2018-07-26 DIAGNOSIS — Z803 Family history of malignant neoplasm of breast: Secondary | ICD-10-CM

## 2018-07-26 DIAGNOSIS — Z95828 Presence of other vascular implants and grafts: Secondary | ICD-10-CM

## 2018-07-26 LAB — CMP (CANCER CENTER ONLY)
ALBUMIN: 3.7 g/dL (ref 3.5–5.0)
ALK PHOS: 117 U/L (ref 38–126)
ALT: 21 U/L (ref 0–44)
ANION GAP: 8 (ref 5–15)
AST: 14 U/L — AB (ref 15–41)
BUN: 10 mg/dL (ref 6–20)
CALCIUM: 8.9 mg/dL (ref 8.9–10.3)
CO2: 25 mmol/L (ref 22–32)
Chloride: 108 mmol/L (ref 98–111)
Creatinine: 0.76 mg/dL (ref 0.44–1.00)
GFR, Est AFR Am: >60 mL/min (ref >60)
GLUCOSE: 134 mg/dL — AB (ref 70–99)
Potassium: 3.1 mmol/L — ABNORMAL LOW (ref 3.5–5.1)
Sodium: 141 mmol/L (ref 135–145)
TOTAL PROTEIN: 6.9 g/dL (ref 6.5–8.1)

## 2018-07-26 LAB — CBC WITH DIFFERENTIAL (CANCER CENTER ONLY)
BASOS ABS: 0 10*3/uL (ref 0.0–0.1)
BASOS PCT: 0 %
EOS PCT: 0 %
Eosinophils Absolute: 0 10*3/uL (ref 0.0–0.5)
HCT: 36.7 % (ref 34.8–46.6)
Hemoglobin: 12.2 g/dL (ref 11.6–15.9)
Lymphocytes Relative: 31 %
Lymphs Abs: 3.3 10*3/uL (ref 0.9–3.3)
MCH: 28.8 pg (ref 25.1–34.0)
MCHC: 33.2 g/dL (ref 31.5–36.0)
MCV: 86.7 fL (ref 79.5–101.0)
MONO ABS: 0.8 10*3/uL (ref 0.1–0.9)
Monocytes Relative: 8 %
Neutro Abs: 6.3 10*3/uL (ref 1.5–6.5)
Neutrophils Relative %: 61 %
Platelet Count: 230 10*3/uL (ref 145–400)
RBC: 4.23 MIL/uL (ref 3.70–5.45)
RDW: 13.9 % (ref 11.2–14.5)
WBC Count: 10.4 10*3/uL — ABNORMAL HIGH (ref 3.9–10.3)

## 2018-07-26 MED ORDER — SODIUM CHLORIDE 0.9 % IV SOLN
600.0000 mg/m2 | Freq: Once | INTRAVENOUS | Status: AC
  Administered 2018-07-26: 1280 mg via INTRAVENOUS
  Filled 2018-07-26: qty 64

## 2018-07-26 MED ORDER — PALONOSETRON HCL INJECTION 0.25 MG/5ML
INTRAVENOUS | Status: AC
  Filled 2018-07-26: qty 5

## 2018-07-26 MED ORDER — SODIUM CHLORIDE 0.9 % IV SOLN
Freq: Once | INTRAVENOUS | Status: AC
  Administered 2018-07-26: 14:00:00 via INTRAVENOUS
  Filled 2018-07-26: qty 5

## 2018-07-26 MED ORDER — SODIUM CHLORIDE 0.9% FLUSH
10.0000 mL | INTRAVENOUS | Status: DC | PRN
  Administered 2018-07-26: 10 mL
  Filled 2018-07-26: qty 10

## 2018-07-26 MED ORDER — PALONOSETRON HCL INJECTION 0.25 MG/5ML
0.2500 mg | Freq: Once | INTRAVENOUS | Status: AC
  Administered 2018-07-26: 0.25 mg via INTRAVENOUS

## 2018-07-26 MED ORDER — DOXORUBICIN HCL CHEMO IV INJECTION 2 MG/ML
60.0000 mg/m2 | Freq: Once | INTRAVENOUS | Status: AC
  Administered 2018-07-26: 128 mg via INTRAVENOUS
  Filled 2018-07-26: qty 64

## 2018-07-26 MED ORDER — HEPARIN SOD (PORK) LOCK FLUSH 100 UNIT/ML IV SOLN
500.0000 [IU] | Freq: Once | INTRAVENOUS | Status: AC | PRN
  Administered 2018-07-26: 500 [IU]
  Filled 2018-07-26: qty 5

## 2018-07-26 MED ORDER — SODIUM CHLORIDE 0.9 % IV SOLN
Freq: Once | INTRAVENOUS | Status: AC
  Administered 2018-07-26: 14:00:00 via INTRAVENOUS
  Filled 2018-07-26: qty 250

## 2018-07-26 NOTE — Assessment & Plan Note (Signed)
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 Neoadjuvant chemotherapy with Adriamycin and Cytoxan dose dense 4 followed byTaxol and carboplatinweekly 12 2. Followed by breast conserving surgery with sentinel lymph node study  3. Followed by adjuvant radiation therapy ------------------------------------------------------------------------------ Current treatment: Cycle 2 day 1 dose dense Adriamycin and Cytoxan Labs have been reviewed  Chemo toxicities:  1.  Mild nausea 2. profound tiredness  I reviewed the blood work and it does not show any evidence of neutropenia or cytopenias. We will keep the dosage the same based on her blood work as well as symptoms.  Echocardiogram 07/09/2018: EF 55 to 60%  Return to clinic in 2 weeks for cycle 3

## 2018-07-26 NOTE — Progress Notes (Signed)
HPI:  Tara Rodriguez was previously seen in the [facility] on 07/08/2018 due to a personal and family history of breast cancer and concerns regarding a hereditary predisposition to cancer. Please refer to our prior cancer genetics clinic note for more information regarding Ms. Schlick's medical, social and family histories, and our assessment and recommendations, at the time. Ms. Facemire recent genetic test results were disclosed to her, as well as recommendations warranted by these results. These results and recommendations are discussed in more detail below.  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 The Common Hereditary Cancers Panel offered by [facility] includes sequencing and/or deletion duplication testing of the following 47 genes: APC, ATM, AXIN2, BARD1, BMPR1A, BRCA1, BRCA2, BRIP1, CDH1, CDKN2A (p14ARF), CDKN2A (p16INK4a), CKD4, CHEK2, CTNNA1, DICER1, EPCAM (Deletion/duplication testing only), GREM1 (promoter region deletion/duplication testing only), KIT, MEN1, MLH1, MSH2, MSH3, MSH6, MUTYH, NBN, NF1, NHTL1, PALB2, PDGFRA, PMS2, POLD1, POLE, PTEN, RAD50, RAD51C, RAD51D, SDHB, SDHC, SDHD, SMAD4, SMARCA4. STK11, TP53, TSC1, TSC2, and VHL.  The following genes were evaluated for sequence changes only: SDHA and HOXB13 c.251G>A variant only.  The report date is 07/23/2018.      FAMILY HISTORY:  We obtained a detailed,  4-generation family history.  Significant diagnoses are listed below: Family History  Problem Relation Age of Onset  . Heart disease Mother        pacemaker  . Diabetes Mother   . Arthritis Mother   . Alcohol abuse Father   . Breast cancer Sister        61s  . Breast cancer Maternal Grandmother        dx 33, dx 30s  . Stroke Maternal Grandfather   . Colon cancer Paternal Grandmother   . Uterine cancer Paternal Grandmother   . Cancer Paternal Grandmother        unknown 3rd cancer  . Hypertension Neg Hx   . Hyperlipidemia Neg Hx    Ms. Koska has four children. Three daughters ages 37, 23 and 57 and a son age 76. She has 9 grandchildren. Ms. Marmol has one full sister in her late 73s who was diagnosed with breast cancer previously at unknown age. Ms. Bartko does not know if she had genetic testing. Ms. Beshears has a half brother through her dad, and a half sister who passed away through her dad.   Ms. Vultaggio father is 22, no cancer history. Ms. Lalani has two paternal aunts and two paternal uncles. No cancer history for these individuals, and no cancer in her paternal cousins. Her paternal grandmother was diagnosed with colon, uterine, and a third unknown cancer. Ms. Marchiano believes these were three separate cancers and does not know the ages of diagnosis or age of death. Ms. Decarlo paternal grandfather did not have cancer.  Ms. Dimartino mother is 63, no cancer history. Ms. Adelsberger has a maternal  uncle, Pilar Plate, who accompanied her to the [place], and a maternal aunt who died at 61. Ms. Stagliano has 6 maternal cousins with no cancer history. Ms. Borrayo maternal grandmother had breast cancer diagnosed at 82 and again in her late 96s. She passed away at 3. Ms. Baldi maternal grandfather died at 76.   Ms. Crume is unaware of previous family history of genetic testing for hereditary cancer risks. Patient's maternal ancestors are of African American descent, and paternal ancestors are of African American descent.  There is no reported Ashkenazi Jewish ancestry. There is no known consanguinity.  GENETIC TEST RESULTS: Genetic testing performed through [facility]'s Common Hereditary Cancer Panel reported out on 07/26/2018 showed no pathogenic mutations. The Common Hereditary Cancers Panel offered by [facility] includes sequencing and/or deletion duplication testing of the following 47 genes: APC, ATM, AXIN2, BARD1, BMPR1A, BRCA1, BRCA2, BRIP1, CDH1, CDKN2A (p14ARF), CDKN2A (p16INK4a), CKD4, CHEK2, CTNNA1, DICER1, EPCAM (Deletion/duplication testing only), GREM1 (promoter region deletion/duplication testing only), KIT, MEN1, MLH1, MSH2, MSH3, MSH6, MUTYH, NBN, NF1, NHTL1, PALB2, PDGFRA, PMS2, POLD1, POLE, PTEN, RAD50, RAD51C, RAD51D, SDHB, SDHC, SDHD, SMAD4, SMARCA4. STK11, TP53, TSC1, TSC2, and VHL.  The following genes were evaluated for sequence changes only: SDHA and HOXB13 c.251G>A variant only.  The test report will be scanned into EPIC and will be located under the Molecular Pathology section of the Results Review tab. A portion of the result report is included below for reference.     We discussed with Ms. Goodson that because current genetic testing is not perfect, it is possible there may be a gene mutation in one of these genes that current testing cannot detect, but that chance is small.  We also discussed, that there could be another gene that has not yet been discovered, or that we have not yet tested, that is responsible for the cancer diagnoses in the family. It is also possible there is a hereditary cause for the cancer in the family that Ms. Kristensen did not inherit and therefore was not identified in her testing.  Therefore, it is important to remain in touch with cancer genetics in the future so that we can continue to offer Ms. Feliz the most up to date genetic testing.   ADDITIONAL GENETIC TESTING: We discussed with Ms. Willeford that there are other genes that are associated with increased cancer risk that can be  analyzed. The laboratories that offer this testing look at these additional genes via a hereditary cancer gene panel. Should Ms. Glaab wish to pursue additional genetic testing, we are happy to discuss and coordinate this testing, at any time.    CANCER SCREENING RECOMMENDATIONS: Ms. Dolecki test result is considered negative (normal).  This means that we have not identified a hereditary cause for her personal and family history of cancer at this time.   While reassuring, this does not definitively rule out a hereditary predisposition to cancer. It is still possible that there could be genetic mutations that are undetectable by current technology, or genetic mutations in genes that have not been tested or identified to increase cancer risk.  Therefore, it is recommended she continue to follow the cancer management and screening guidelines provided by her oncology and primary healthcare provider. An individual's cancer risk is not determined by genetic test results alone.  Overall cancer risk assessment includes additional factors such as personal medical history, family history, etc.  These should be used to make a personalized plan for cancer prevention and surveillance.    RECOMMENDATIONS FOR  FAMILY MEMBERS:  Relatives in this family might be at some increased risk of developing cancer, over the general population risk, simply due to the family history of cancer.  We recommended women in this family have a yearly mammogram beginning at age 23, or 45 years younger than the earliest onset of cancer, an annual clinical breast exam, and perform monthly breast self-exams. Women in this family should also have a gynecological exam as recommended by their primary provider. All family members should have a colonoscopy by age 46 (or as directed by their doctors).  All family members should inform their physicians about the family history of cancer so their doctors can make the most appropriate screening  recommendations for them.   It is also possible there is a hereditary cause for the cancer in Ms. Archibald's family that she did not inherit and therefore was not identified in her.   We recommended her sister who was diagnosed with breast cancer in her 23's, have genetic counseling and testing. Ms. Schul will let us know if we can be of any assistance in coordinating genetic counseling and/or testing for these family members.   FOLLOW-UP: Lastly, we discussed with Ms. Khanna that cancer genetics is a rapidly advancing field and it is possible that new genetic tests will be appropriate for her and/or her family members in the future. We encouraged her to remain in contact with cancer genetics on an annual basis so we can update her personal and family histories and let her know of advances in cancer genetics that may benefit this family.   Our contact number was provided. Ms. Demeritt questions were answered to her satisfaction, and she knows she is welcome to call us at anytime with additional questions or concerns.   Epimenio Foot, MS Genetic Counselor [email or web address] Phone: [phone]

## 2018-07-26 NOTE — Progress Notes (Signed)
Patient Care Team: Tysinger, Camelia Eng, PA-C as PCP - General (Family Medicine) Excell Seltzer, MD as Consulting Physician (General Surgery) Nicholas Lose, MD as Consulting Physician (Hematology and Oncology) Gery Pray, MD as Consulting Physician (Radiation Oncology)  DIAGNOSIS:  Encounter Diagnosis  Name Primary?  . 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 The Common Hereditary Cancers Panel offered by [facility] includes sequencing and/or deletion duplication testing of the following 47 genes: APC, ATM, AXIN2, BARD1, BMPR1A, BRCA1, BRCA2, BRIP1, CDH1, CDKN2A (p14ARF), CDKN2A (p16INK4a), CKD4, CHEK2, CTNNA1, DICER1, EPCAM (Deletion/duplication testing only), GREM1 (promoter region deletion/duplication testing only), KIT, MEN1, MLH1, MSH2, MSH3, MSH6, MUTYH, NBN, NF1, NHTL1, PALB2, PDGFRA, PMS2, POLD1, POLE, PTEN, RAD50, RAD51C, RAD51D, SDHB, SDHC, SDHD, SMAD4, SMARCA4. STK11, TP53, TSC1, TSC2, and VHL.  The following genes were evaluated for sequence changes only: SDHA and HOXB13 c.251G>A variant only.  The report date is 07/23/2018.     CHIEF COMPLIANT: Cycle 2 dose dense Adriamycin and Cytoxan  INTERVAL HISTORY: BRIANY AYE is a 47 year old with above-mentioned history of right breast cancer  currently neoadjuvant chemotherapy and today is cycle 2 of dose dense Adriamycin and Cytoxan.  She done extremely well from cycle 1.  Her biggest complaint is that she lost a bunch of hair last Saturday.  She denies any nausea or vomiting.  Denies any diarrhea constipation.  Denies any mouth sores.  She has fairly good appetite and maintaining her weight.  REVIEW OF SYSTEMS:   Constitutional: Denies fevers, chills or abnormal weight loss Eyes: Denies blurriness of vision Ears, nose, mouth, throat, and face: Denies mucositis or sore throat Respiratory: Denies cough, dyspnea or wheezes Cardiovascular: Denies palpitation, chest discomfort Gastrointestinal:  Denies nausea, heartburn or change in bowel habits Skin: Denies abnormal skin rashes Lymphatics: Denies new lymphadenopathy or easy bruising Neurological:Denies numbness, tingling or new weaknesses Behavioral/Psych: Mood is stable, no new changes  Extremities: No lower extremity edema Breast:  denies any pain or lumps or nodules in either breasts All other systems were reviewed with the patient and are negative.  I have reviewed the past medical history, past surgical history, social history and family history with the patient and they are unchanged from previous note.  ALLERGIES:  has No Known Allergies.  MEDICATIONS:  Current Outpatient Medications  Medication Sig Dispense Refill  . acetaminophen (TYLENOL) 500 MG tablet Take 500 mg by mouth every 6 (six) hours as needed.    Marland Kitchen HYDROcodone-acetaminophen (NORCO/VICODIN) 5-325 MG tablet Take 1 tablet by mouth every 6 (six) hours as needed for moderate pain or severe pain. 10 tablet 0  . lidocaine-prilocaine (EMLA) cream Apply to affected area once 30 g 3  . LORazepam (ATIVAN) 0.5 MG tablet Take 1 tablet (0.5 mg total) by mouth at bedtime  as needed for sleep. 30 tablet 0  . naproxen sodium (ALEVE) 220 MG tablet Take 220 mg by mouth as needed.    . ondansetron (ZOFRAN) 8 MG tablet Take 1  tablet (8 mg total) by mouth 2 (two) times daily as needed. Start on the third day after chemotherapy. 30 tablet 1  . prochlorperazine (COMPAZINE) 10 MG tablet Take 1 tablet (10 mg total) by mouth every 6 (six) hours as needed (Nausea or vomiting). 30 tablet 1  . vitamin E 100 UNIT capsule Take by mouth as needed.     No current facility-administered medications for this visit.     PHYSICAL EXAMINATION: ECOG PERFORMANCE STATUS: 1 - Symptomatic but completely ambulatory  Vitals:   07/26/18 1340  BP: (!) 132/93  Pulse: 80  Resp: 18  Temp: 98.4 F (36.9 C)  SpO2: 100%   Filed Weights   07/26/18 1340  Weight: 206 lb 3.2 oz (93.5 kg)    GENERAL:alert, no distress and comfortable SKIN: skin color, texture, turgor are normal, no rashes or significant lesions EYES: normal, Conjunctiva are pink and non-injected, sclera clear OROPHARYNX:no exudate, no erythema and lips, buccal mucosa, and tongue normal  NECK: supple, thyroid normal size, non-tender, without nodularity LYMPH:  no palpable lymphadenopathy in the cervical, axillary or inguinal LUNGS: clear to auscultation and percussion with normal breathing effort HEART: regular rate & rhythm and no murmurs and no lower extremity edema ABDOMEN:abdomen soft, non-tender and normal bowel sounds MUSCULOSKELETAL:no cyanosis of digits and no clubbing  NEURO: alert & oriented x 3 with fluent speech, no focal motor/sensory deficits EXTREMITIES: No lower extremity edema    LABORATORY DATA:  I have reviewed the data as listed CMP Latest Ref Rng & Units 07/19/2018 07/12/2018 06/30/2018  Glucose 70 - 99 mg/dL 121(H) 94 154(H)  BUN 6 - 20 mg/dL '8 9 7  '$ Creatinine 0.44 - 1.00 mg/dL 0.74 0.77 0.84  Sodium 135 - 145 mmol/L 139 142 142  Potassium 3.5 - 5.1 mmol/L 4.0 3.5 3.8  Chloride 98 - 111 mmol/L 107 110 107  CO2 22 - 32 mmol/L '28 24 29  '$ Calcium 8.9 - 10.3 mg/dL 8.5(L) 8.1(L) 9.3  Total Protein 6.5 - 8.1 g/dL 6.5 6.4(L) 7.3  Total Bilirubin 0.3  - 1.2 mg/dL 0.4 0.3 0.4  Alkaline Phos 38 - 126 U/L 127(H) 78 81  AST 15 - 41 U/L 15 14(L) 14(L)  ALT 0 - 44 U/L 58(H) 20 18    Lab Results  Component Value Date   WBC 10.4 (H) 07/26/2018   HGB 12.2 07/26/2018   HCT 36.7 07/26/2018   MCV 86.7 07/26/2018   PLT 230 07/26/2018   NEUTROABS 6.3 07/26/2018    ASSESSMENT & PLAN:  Malignant neoplasm of upper-outer quadrant of right breast in female, estrogen receptor negative ([place]) 06/23/2018:Palpable lump in the right breast for 2 months UOQ by mammogram and ultrasound cystic and solid lesion at 10 o'clock position measuring 4.1 cm, axilla negative, 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 Neoadjuvant chemotherapy with Adriamycin and Cytoxan dose dense 4 followed byTaxol and carboplatinweekly 12 2. Followed by breast conserving surgery with sentinel lymph node study  3. Followed by adjuvant radiation therapy ------------------------------------------------------------------------------ Current treatment: Cycle 2 day 1 dose dense Adriamycin and Cytoxan Labs have been reviewed  Chemo toxicities:  1.  Mild nausea 2. profound tiredness  I reviewed the blood work and  it does not show any evidence of neutropenia or cytopenias. We will keep the dosage the same based on her blood work as well as symptoms.  Echocardiogram 07/09/2018: EF 55 to 60%  Return to clinic in 2 weeks for cycle 3    No orders of the defined types were placed in this encounter.  The patient has a good understanding of the overall plan. she agrees with it. she will call with any problems that may develop before the next visit here.   Harriette Ohara, MD 07/26/18

## 2018-07-26 NOTE — Telephone Encounter (Signed)
Revealed negative genetic testing.   This normal result is reassuring and indicates that it is unlikely Tara Rodriguez's cancer is due to a hereditary cause.  It is unlikely that there is an increased risk of another cancer due to a mutation in one of these genes.  However, genetic testing is not perfect, and cannot definitively rule out a hereditary cause.  It will be important for her to keep in contact with genetics to learn if any additional testing may be needed in the future.

## 2018-07-26 NOTE — Patient Instructions (Signed)
[facility] Discharge Instructions for Patients Receiving Chemotherapy  Today you received the following chemotherapy agents Adriamycin and Cytoxan  To help prevent nausea and vomiting after your treatment, we encourage you to take your nausea medication as directed.  If you develop nausea and vomiting that is not controlled by your nausea medication, call the clinic.   BELOW ARE SYMPTOMS THAT SHOULD BE REPORTED IMMEDIATELY:  *FEVER GREATER THAN 100.5 F  *CHILLS WITH OR WITHOUT FEVER  NAUSEA AND VOMITING THAT IS NOT CONTROLLED WITH YOUR NAUSEA MEDICATION  *UNUSUAL SHORTNESS OF BREATH  *UNUSUAL BRUISING OR BLEEDING  TENDERNESS IN MOUTH AND THROAT WITH OR WITHOUT PRESENCE OF ULCERS  *URINARY PROBLEMS  *BOWEL PROBLEMS  UNUSUAL RASH Items with * indicate a potential emergency and should be followed up as soon as possible.  Feel free to call the clinic should you have any questions or concerns. The clinic phone number is [phone].  Please show the CHEMO ALERT CARD at check-in to the Emergency Department and triage nurse.   

## 2018-08-03 ENCOUNTER — Other Ambulatory Visit: Payer: Self-pay

## 2018-08-03 ENCOUNTER — Emergency Department (HOSPITAL_COMMUNITY)
Admission: EM | Admit: 2018-08-03 | Discharge: 2018-08-03 | Disposition: A | Discharge status: Home / Self Care | Payer: 59 | Attending: Emergency Medicine | Admitting: Emergency Medicine

## 2018-08-03 ENCOUNTER — Encounter (HOSPITAL_COMMUNITY): Payer: Self-pay | Admitting: Radiology

## 2018-08-03 ENCOUNTER — Emergency Department (HOSPITAL_COMMUNITY): Payer: 59

## 2018-08-03 DIAGNOSIS — K047 Periapical abscess without sinus: Secondary | ICD-10-CM | POA: Diagnosis not present

## 2018-08-03 DIAGNOSIS — R6884 Jaw pain: Secondary | ICD-10-CM | POA: Diagnosis not present

## 2018-08-03 DIAGNOSIS — Z79899 Other long term (current) drug therapy: Secondary | ICD-10-CM | POA: Diagnosis not present

## 2018-08-03 DIAGNOSIS — R220 Localized swelling, mass and lump, head: Secondary | ICD-10-CM | POA: Diagnosis not present

## 2018-08-03 LAB — COMPREHENSIVE METABOLIC PANEL
ALBUMIN: 3.9 g/dL (ref 3.5–5.0)
ALT: 32 U/L (ref 0–44)
ANION GAP: 11 (ref 5–15)
AST: 18 U/L (ref 15–41)
Alkaline Phosphatase: 71 U/L (ref 38–126)
BILIRUBIN TOTAL: 0.4 mg/dL (ref 0.3–1.2)
BUN: 10 mg/dL (ref 6–20)
CHLORIDE: 108 mmol/L (ref 98–111)
CO2: 22 mmol/L (ref 22–32)
Calcium: 8.8 mg/dL — ABNORMAL LOW (ref 8.9–10.3)
Creatinine, Ser: 0.63 mg/dL (ref 0.44–1.00)
GFR calc Af Amer: >60 mL/min (ref >60)
GFR calc non Af Amer: >60 mL/min (ref >60)
Glucose, Bld: 140 mg/dL — ABNORMAL HIGH (ref 70–99)
Potassium: 3.7 mmol/L (ref 3.5–5.1)
Sodium: 141 mmol/L (ref 135–145)
Total Protein: 7.2 g/dL (ref 6.5–8.1)

## 2018-08-03 LAB — CBC WITH DIFFERENTIAL/PLATELET
Basophils Absolute: 0.1 10*3/uL (ref 0.0–0.1)
Basophils Relative: 1 %
Eosinophils Absolute: 0 10*3/uL (ref 0.0–0.7)
Eosinophils Relative: 0 %
HEMATOCRIT: 34 % — AB (ref 36.0–46.0)
Hemoglobin: 11.4 g/dL — ABNORMAL LOW (ref 12.0–15.0)
LYMPHS ABS: 1 10*3/uL (ref 0.7–4.0)
LYMPHS PCT: 18 %
MCH: 28.5 pg (ref 26.0–34.0)
MCHC: 33.5 g/dL (ref 30.0–36.0)
MCV: 85 fL (ref 78.0–100.0)
MONOS PCT: 2 %
Monocytes Absolute: 0.1 10*3/uL (ref 0.1–1.0)
NEUTROS ABS: 4.4 10*3/uL (ref 1.7–7.7)
Neutrophils Relative %: 79 %
Platelets: 295 10*3/uL (ref 150–400)
RBC: 4 MIL/uL (ref 3.87–5.11)
RDW: 13.6 % (ref 11.5–15.5)
WBC: 5.6 10*3/uL (ref 4.0–10.5)

## 2018-08-03 MED ORDER — ONDANSETRON HCL 4 MG/2ML IJ SOLN
4.0000 mg | Freq: Once | INTRAMUSCULAR | Status: AC
  Administered 2018-08-03: 4 mg via INTRAVENOUS
  Filled 2018-08-03: qty 2

## 2018-08-03 MED ORDER — AMOXICILLIN 500 MG PO CAPS: 500.0000 mg | ORAL_CAPSULE | Freq: Three times a day (TID) | ORAL | 0 refills | Status: DC

## 2018-08-03 MED ORDER — HEPARIN SOD (PORK) LOCK FLUSH 100 UNIT/ML IV SOLN
500.0000 [IU] | Freq: Once | INTRAVENOUS | Status: AC
  Administered 2018-08-03: 500 [IU]
  Filled 2018-08-03: qty 5

## 2018-08-03 MED ORDER — HYDROMORPHONE HCL 1 MG/ML IJ SOLN
0.5000 mg | Freq: Once | INTRAMUSCULAR | Status: AC
  Administered 2018-08-03: 0.5 mg via INTRAVENOUS
  Filled 2018-08-03: qty 1

## 2018-08-03 MED ORDER — HYDROCODONE-ACETAMINOPHEN 5-325 MG PO TABS: 1.0000 | ORAL_TABLET | Freq: Four times a day (QID) | ORAL | 0 refills | Status: DC | PRN

## 2018-08-03 MED ORDER — SODIUM CHLORIDE 0.9 % IV BOLUS
500.0000 mL | Freq: Once | INTRAVENOUS | Status: AC
  Administered 2018-08-03: 500 mL via INTRAVENOUS

## 2018-08-03 MED ORDER — IOPAMIDOL (ISOVUE-300) INJECTION 61%
INTRAVENOUS | Status: AC
  Filled 2018-08-03: qty 100

## 2018-08-03 MED ORDER — IOPAMIDOL (ISOVUE-300) INJECTION 61%
100.0000 mL | Freq: Once | INTRAVENOUS | Status: AC | PRN
  Administered 2018-08-03: 100 mL via INTRAVENOUS

## 2018-08-03 MED FILL — HYDROCODON-APAP 5-325: 5-325 | 1 days supply | Qty: 8 | Fill #0

## 2018-08-03 MED FILL — AMOXICILLIN 500 MG CAPSULE: 500 | 7 days supply | Qty: 21 | Fill #0

## 2018-08-03 NOTE — ED Notes (Signed)
Ginger ale given for po fluid challenge as ordered.

## 2018-08-03 NOTE — ED Notes (Signed)
Patient transported to CT 

## 2018-08-03 NOTE — ED Triage Notes (Signed)
Pt comes to ed, via ems, 9pm last night inside of right check, and soar throat. Pain 10 out 10

## 2018-08-03 NOTE — Discharge Instructions (Addendum)
Watch for worsening signs of infection.  Follow with Dr. Lindi Adie as needed.

## 2018-08-03 NOTE — ED Provider Notes (Signed)
[facility] DEPT Provider Note   CSN: [id number] Arrival date & time: 08/03/18  0702     History   Chief Complaint Chief Complaint  Patient presents with  . Oral Swelling    HPI Tara Rodriguez is a 47 y.o. female.  HPI Patient presents with right jaw and throat pain.  Began last night.  She is currently on chemotherapy for breast cancer.  Has pain with swallowing states it is a little difficult to swallow.  No fevers.  Last chemotherapy infusion was around a week ago.  Pain is worse below her jaw and on the right side.  No cough.  No bleeding.  Has not had pains like this before. Past Medical History:  Diagnosis Date  . Acrochordon    skin, left lower breast  . Allergy   . Anemia    history of  . Blood transfusion without reported diagnosis    during hysterectomy  . Family history of breast cancer   . Family history of colon cancer   . Migraine   . S/P hysterectomy    hx/o fibroids  . Wears glasses     Patient Active Problem List   Diagnosis Date Noted  . Genetic testing 07/26/2018  . Port-A-Cath in place 07/12/2018  . Family history of breast cancer   . Family history of colon cancer   . Malignant neoplasm of upper-outer quadrant of right breast in female, estrogen receptor negative ([place]) 06/29/2018  . Numbness and tingling 06/21/2018  . Breast lump 06/21/2018  . Encounter for health maintenance examination in adult 10/15/2015  . Screening for breast cancer 10/15/2015  . Insomnia 10/15/2015  . Impaired fasting blood sugar 10/15/2015  . Low HDL (under 40) 10/15/2015  . Obesity 10/15/2015  . Perimenopausal 10/15/2015  . Cutaneous skin tags 10/15/2015  . Changing skin lesion 10/15/2015  . Dental plaque 10/15/2015    Past Surgical History:  Procedure Laterality Date  . ABDOMINAL HYSTERECTOMY  2009   total, due to fibroids  . COLONOSCOPY  2009   due to anemia  . PORTACATH PLACEMENT N/A 07/05/2018   Procedure: INSERTION  PORT-A-CATH;  Surgeon: Excell Seltzer, MD;  Location: [facility];  Service: General;  Laterality: N/A;     OB History   None      Home Medications    Prior to Admission medications   Medication Sig Start Date End Date Taking? Authorizing Provider  acetaminophen (TYLENOL) 500 MG tablet Take 500 mg by mouth every 6 (six) hours as needed for mild pain.    Yes [provider]  lidocaine-prilocaine (EMLA) cream Apply to affected area once 07/12/18  Yes Nicholas Lose, MD  LORazepam (ATIVAN) 0.5 MG tablet Take 1 tablet (0.5 mg total) by mouth at bedtime as needed for sleep. 06/30/18  Yes Nicholas Lose, MD  naproxen sodium (ALEVE) 220 MG tablet Take 220 mg by mouth as needed (pain).    Yes [provider]  ondansetron (ZOFRAN) 8 MG tablet Take 1 tablet (8 mg total) by mouth 2 (two) times daily as needed. Start on the third day after chemotherapy. 07/12/18  Yes Nicholas Lose, MD  prochlorperazine (COMPAZINE) 10 MG tablet Take 1 tablet (10 mg total) by mouth every 6 (six) hours as needed (Nausea or vomiting). 07/12/18  Yes Nicholas Lose, MD  VITAMIN E PO Take by mouth daily.    Yes [provider]  amoxicillin (AMOXIL) 500 MG capsule Take 1 capsule (500 mg total) by mouth  3 (three) times daily. 08/03/18   Davonna Belling, MD  HYDROcodone-acetaminophen (NORCO/VICODIN) 5-325 MG tablet Take 1-2 tablets by mouth every 6 (six) hours as needed for moderate pain or severe pain. 08/03/18   Davonna Belling, MD    Family History Family History  Problem Relation Age of Onset  . Heart disease Mother        pacemaker  . Diabetes Mother   . Arthritis Mother   . Alcohol abuse Father   . Breast cancer Sister        8s  . Breast cancer Maternal Grandmother        dx 21, dx 51s  . Stroke Maternal Grandfather   . Colon cancer Paternal Grandmother   . Uterine cancer Paternal Grandmother   . Cancer Paternal Grandmother        unknown 3rd cancer  . Hypertension Neg  Hx   . Hyperlipidemia Neg Hx     Social History Social History   Tobacco Use  . Smoking status: Never Smoker  . Smokeless tobacco: Never Used  Substance Use Topics  . Alcohol use: No  . Drug use: No     Allergies   Patient has no known allergies.   Review of Systems Review of Systems  Constitutional: Negative for appetite change.  HENT: Positive for facial swelling and sore throat.   Respiratory: Negative for shortness of breath.   Cardiovascular: Negative for chest pain.  Gastrointestinal: Negative for abdominal pain.  Genitourinary: Negative for dysuria.  Musculoskeletal: Negative for back pain.  Skin: Negative for rash.  Neurological: Negative for weakness.  Psychiatric/Behavioral: Negative for confusion.     Physical Exam Updated Vital Signs BP (!) 165/92   Pulse (!) 103   Temp 98.4 F (36.9 C) (Oral)   Resp 18   Ht 5\' 9"  (1.753 m)   Wt 93.4 kg   SpO2 95%   BMI 30.42 kg/m   Physical Exam  Constitutional: She appears well-developed.  HENT:  Swelling behind the most posterior molar on the right lower jaw.  Some erythema.  There is a submandibular swelling towards more post anterior aspect of the jaw on the right side approximately 2-1/2 cm long.  No erythema on the skin side.  No swelling on the floor the mouth from the mucosal side.  Posterior pharynx with only mild erythema on the right side.  Eyes: Pupils are equal, round, and reactive to light.  Neck: Neck supple.  Cardiovascular: Normal rate.  Pulmonary/Chest: Effort normal.  Abdominal: Soft.  Musculoskeletal: She exhibits no tenderness.  Neurological: She is alert.  Skin: Skin is warm. Capillary refill takes less than 2 seconds.     ED Treatments / Results  Labs (all labs ordered are listed, but only abnormal results are displayed) Labs Reviewed  COMPREHENSIVE METABOLIC PANEL - Abnormal; Notable for the following components:      Result Value   Glucose, Bld 140 (*)    Calcium 8.8 (*)     All other components within normal limits  CBC WITH DIFFERENTIAL/PLATELET - Abnormal; Notable for the following components:   Hemoglobin 11.4 (*)    HCT 34.0 (*)    All other components within normal limits    EKG None  Radiology Ct Maxillofacial W Contrast  Result Date: 08/03/2018 CLINICAL DATA:  Pain. Swelling to the right face and lip. Sore throat. Personal history of breast cancer. Ongoing chemotherapy. EXAM: CT MAXILLOFACIAL WITH CONTRAST TECHNIQUE: Multidetector CT imaging of the maxillofacial structures was performed with intravenous  contrast. Multiplanar CT image reconstructions were also generated. CONTRAST:  156mL ISOVUE-300 IOPAMIDOL (ISOVUE-300) INJECTION 61% COMPARISON:  None. FINDINGS: Osseous: No acute or healing fracture is present. No focal lytic or blastic lesion is present. Orbits: Remote right orbital blowout fracture is present. Globes and orbits are otherwise within normal limits. Sinuses: The paranasal sinuses and mastoid air cells are clear. Soft tissues: There is some soft tissue inflammatory change involving the lower lip and extending to the chin. No discrete abscess or mass is present. This is slightly asymmetric to the right. No focal skin lesion is evident. Level 1 submental lymph nodes appear reactive. No pathologically enlarged nodes are present. Limited intracranial: Limited intracranial imaging is unremarkable. IMPRESSION: 1. Soft tissue swelling involving the lower lip to the chin, asymmetric to the right. No discrete mass lesion bone abscess, or dental etiology is present. Question cellulitis or trauma. 2. Remote medial orbital blowout fracture of the right orbit other focal osseous lesions. Electronically Signed   By: San Morelle M.D.   On: 08/03/2018 09:33    Procedures Procedures (including critical care time)  Medications Ordered in ED Medications  sodium chloride 0.9 % bolus 500 mL (0 mLs Intravenous Stopped 08/03/18 0918)  HYDROmorphone (DILAUDID)  injection 0.5 mg (0.5 mg Intravenous Given 08/03/18 0807)  ondansetron (ZOFRAN) injection 4 mg (4 mg Intravenous Given 08/03/18 0804)  iopamidol (ISOVUE-300) 61 % injection 100 mL (100 mLs Intravenous Contrast Given 08/03/18 0857)  heparin lock flush 100 unit/mL (500 Units Intracatheter Given 08/03/18 1103)     Initial Impression / Assessment and Plan / ED Course  I have reviewed the triage vital signs and the nursing notes.  Pertinent labs & imaging results that were available during my care of the patient were reviewed by me and considered in my medical decision making (see chart for details).     Patient with swelling and mouth.  I think this is likely dental origin.  Swelling posterior to tooth.  CT scan reassuring.  Will treat empirically since she is on immunosuppression.  Discharged home to follow-up as needed.  Final Clinical Impressions(s) / ED Diagnoses   Final diagnoses:  Dental infection    ED Discharge Orders         Ordered    amoxicillin (AMOXIL) 500 MG capsule  3 times daily     08/03/18 1054    HYDROcodone-acetaminophen (NORCO/VICODIN) 5-325 MG tablet  Every 6 hours PRN     08/03/18 1054           Davonna Belling, MD 08/03/18 1526

## 2018-08-04 ENCOUNTER — Telehealth: Payer: Self-pay

## 2018-08-04 NOTE — Telephone Encounter (Signed)
Called pt and lvm with call back number to follow up on pt symptoms. Received after hrs line call message regarding pt experiencing oral swelling in the tongue. Pt presented in ED and was discharge to home with antibiotic. Call to pt to see if she is better or if she needs to come in and see symptom management.

## 2018-08-05 ENCOUNTER — Telehealth: Payer: Self-pay

## 2018-08-05 NOTE — Telephone Encounter (Signed)
Left voicemail to follow up with recent ED visit.  Contact information left for patient if she has any further concerns or questions.

## 2018-08-09 ENCOUNTER — Encounter: Payer: Self-pay | Admitting: *Deleted

## 2018-08-09 ENCOUNTER — Inpatient Hospital Stay (HOSPITAL_BASED_OUTPATIENT_CLINIC_OR_DEPARTMENT_OTHER): Payer: 59 | Admitting: Hematology and Oncology

## 2018-08-09 ENCOUNTER — Telehealth: Payer: Self-pay | Admitting: Hematology and Oncology

## 2018-08-09 ENCOUNTER — Ambulatory Visit: Payer: 59

## 2018-08-09 ENCOUNTER — Inpatient Hospital Stay: Payer: 59

## 2018-08-09 ENCOUNTER — Inpatient Hospital Stay: Payer: 59 | Attending: Genetic Counselor

## 2018-08-09 VITALS — BP 136/98 | HR 67 | Temp 98.2°F | Resp 18 | Ht 69.0 in | Wt 203.1 lb

## 2018-08-09 DIAGNOSIS — D709 Neutropenia, unspecified: Secondary | ICD-10-CM

## 2018-08-09 DIAGNOSIS — Z79899 Other long term (current) drug therapy: Secondary | ICD-10-CM | POA: Diagnosis not present

## 2018-08-09 DIAGNOSIS — Z5111 Encounter for antineoplastic chemotherapy: Secondary | ICD-10-CM | POA: Diagnosis not present

## 2018-08-09 DIAGNOSIS — Z171 Estrogen receptor negative status [ER-]: Secondary | ICD-10-CM

## 2018-08-09 DIAGNOSIS — Z95828 Presence of other vascular implants and grafts: Secondary | ICD-10-CM

## 2018-08-09 DIAGNOSIS — C50411 Malignant neoplasm of upper-outer quadrant of right female breast: Secondary | ICD-10-CM

## 2018-08-09 DIAGNOSIS — Z7689 Persons encountering health services in other specified circumstances: Secondary | ICD-10-CM | POA: Diagnosis not present

## 2018-08-09 DIAGNOSIS — R7989 Other specified abnormal findings of blood chemistry: Secondary | ICD-10-CM | POA: Diagnosis not present

## 2018-08-09 LAB — CMP (CANCER CENTER ONLY)
ALBUMIN: 3.8 g/dL (ref 3.5–5.0)
ALT: 23 U/L (ref 0–44)
AST: 17 U/L (ref 15–41)
Alkaline Phosphatase: 70 U/L (ref 38–126)
Anion gap: 7 (ref 5–15)
BUN: 7 mg/dL (ref 6–20)
CHLORIDE: 110 mmol/L (ref 98–111)
CO2: 26 mmol/L (ref 22–32)
Calcium: 9 mg/dL (ref 8.9–10.3)
Creatinine: 0.72 mg/dL (ref 0.44–1.00)
GFR, Est AFR Am: >60 mL/min (ref >60)
GFR, Estimated: >60 mL/min (ref >60)
GLUCOSE: 94 mg/dL (ref 70–99)
POTASSIUM: 3.7 mmol/L (ref 3.5–5.1)
Sodium: 143 mmol/L (ref 135–145)
Total Bilirubin: <0.2 mg/dL — ABNORMAL LOW (ref 0.3–1.2)
Total Protein: 6.9 g/dL (ref 6.5–8.1)

## 2018-08-09 LAB — CBC WITH DIFFERENTIAL (CANCER CENTER ONLY)
BASOS PCT: 5 %
Basophils Absolute: 0.1 10*3/uL (ref 0.0–0.1)
EOS ABS: 0 10*3/uL (ref 0.0–0.5)
Eosinophils Relative: 1 %
HCT: 33.3 % — ABNORMAL LOW (ref 34.8–46.6)
Hemoglobin: 10.9 g/dL — ABNORMAL LOW (ref 11.6–15.9)
Lymphocytes Relative: 59 %
Lymphs Abs: 1.5 10*3/uL (ref 0.9–3.3)
MCH: 28.7 pg (ref 25.1–34.0)
MCHC: 32.8 g/dL (ref 31.5–36.0)
MCV: 87.7 fL (ref 79.5–101.0)
MONO ABS: 0.5 10*3/uL (ref 0.1–0.9)
MONOS PCT: 20 %
Neutro Abs: 0.4 10*3/uL — CL (ref 1.5–6.5)
Neutrophils Relative %: 15 %
PLATELETS: 172 10*3/uL (ref 145–400)
RBC: 3.8 MIL/uL (ref 3.70–5.45)
RDW: 13.4 % (ref 11.2–14.5)
WBC Count: 2.5 10*3/uL — ABNORMAL LOW (ref 3.9–10.3)

## 2018-08-09 MED ORDER — SODIUM CHLORIDE 0.9% FLUSH
10.0000 mL | INTRAVENOUS | Status: DC | PRN
  Administered 2018-08-09: 10 mL
  Filled 2018-08-09: qty 10

## 2018-08-09 MED ORDER — TBO-FILGRASTIM 480 MCG/0.8ML ~~LOC~~ SOSY
PREFILLED_SYRINGE | SUBCUTANEOUS | Status: AC
  Filled 2018-08-09: qty 0.8

## 2018-08-09 MED ORDER — TBO-FILGRASTIM 480 MCG/0.8ML ~~LOC~~ SOSY
480.0000 ug | PREFILLED_SYRINGE | Freq: Once | SUBCUTANEOUS | Status: AC
  Administered 2018-08-09: 480 ug via SUBCUTANEOUS

## 2018-08-09 MED ORDER — ALTEPLASE 2 MG IJ SOLR
2.0000 mg | Freq: Once | INTRAMUSCULAR | Status: DC | PRN
  Filled 2018-08-09: qty 2

## 2018-08-09 MED ORDER — HEPARIN SOD (PORK) LOCK FLUSH 100 UNIT/ML IV SOLN
500.0000 [IU] | Freq: Once | INTRAVENOUS | Status: AC | PRN
  Administered 2018-08-09: 500 [IU]
  Filled 2018-08-09: qty 5

## 2018-08-09 NOTE — Progress Notes (Signed)
Patient Care Team: Tysinger, Camelia Eng, PA-C as PCP - General (Family Medicine) Excell Seltzer, MD as Consulting Physician (General Surgery) Nicholas Lose, MD as Consulting Physician (Hematology and Oncology) Gery Pray, MD as Consulting Physician (Radiation Oncology)  DIAGNOSIS:  Encounter Diagnoses  Name Primary?  . Port-A-Cath in place Yes  . 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 The Common Hereditary Cancers Panel offered by [facility] includes sequencing and/or deletion duplication testing of the following 47 genes: APC, ATM, AXIN2, BARD1, BMPR1A, BRCA1, BRCA2, BRIP1, CDH1, CDKN2A (p14ARF), CDKN2A (p16INK4a), CKD4, CHEK2, CTNNA1, DICER1, EPCAM (Deletion/duplication testing only), GREM1 (promoter region deletion/duplication testing only), KIT, MEN1, MLH1, MSH2, MSH3, MSH6, MUTYH, NBN, NF1, NHTL1, PALB2, PDGFRA, PMS2, POLD1, POLE, PTEN, RAD50, RAD51C, RAD51D, SDHB, SDHC, SDHD, SMAD4, SMARCA4. STK11, TP53, TSC1, TSC2, and VHL.  The following genes were evaluated for sequence changes only: SDHA and HOXB13 c.251G>A variant only.  The report date is 07/23/2018.     CHIEF COMPLIANT: Cycle 3 dose dense Adriamycin and Cytoxan  INTERVAL HISTORY: Tara Rodriguez is a 47 year old with above-mentioned  history of right breast cancer who is here for cycle 3 of dose dense Adriamycin and Cytoxan.  After cycle 2 she developed gingivitis for which she went to the emergency room and received antibiotics.  It appears that the gingivitis is much better this is primarily in the posterior aspect of the right lower gums.  She did not have any fevers or chills.  Unfortunately today she cannot receive chemotherapy because her white blood cell count is not adequate.  When we reviewed her chart it appears that she did not get Udenyca inj after cycle 2.  Therefore she cannot receive chemotherapy today.  REVIEW OF SYSTEMS:   Constitutional: Denies fevers, chills or abnormal weight loss Eyes: Denies blurriness of vision Ears, nose, mouth, throat, and face: Gingival irritation of the back of the mouth Respiratory: Denies cough, dyspnea or wheezes Cardiovascular: Denies palpitation, chest discomfort Gastrointestinal:  Denies nausea, heartburn or change in bowel habits Skin: Denies abnormal skin rashes Lymphatics: Denies new lymphadenopathy or easy bruising Neurological:Denies numbness, tingling or new weaknesses Behavioral/Psych: Mood is stable, no new changes  Extremities: No lower extremity edema   All other systems were reviewed with the patient and are negative.  I have reviewed the past medical history, past surgical history, social history and family history with the patient and they are unchanged from previous note.  ALLERGIES:  has No Known Allergies.  MEDICATIONS:  Current Outpatient Medications  Medication Sig Dispense Refill  . acetaminophen (TYLENOL) 500 MG tablet Take 500 mg by mouth every 6 (six) hours as needed for mild pain.     Marland Kitchen amoxicillin (AMOXIL) 500 MG capsule Take 1 capsule (500 mg total) by mouth 3 (  three) times daily. 21 capsule 0  . HYDROcodone-acetaminophen (NORCO/VICODIN) 5-325 MG tablet Take 1-2 tablets by mouth every 6 (six) hours as needed for moderate pain or severe pain. 8  tablet 0  . lidocaine-prilocaine (EMLA) cream Apply to affected area once 30 g 3  . LORazepam (ATIVAN) 0.5 MG tablet Take 1 tablet (0.5 mg total) by mouth at bedtime as needed for sleep. 30 tablet 0  . naproxen sodium (ALEVE) 220 MG tablet Take 220 mg by mouth as needed (pain).     . ondansetron (ZOFRAN) 8 MG tablet Take 1 tablet (8 mg total) by mouth 2 (two) times daily as needed. Start on the third day after chemotherapy. 30 tablet 1  . prochlorperazine (COMPAZINE) 10 MG tablet Take 1 tablet (10 mg total) by mouth every 6 (six) hours as needed (Nausea or vomiting). 30 tablet 1  . VITAMIN E PO Take by mouth daily.      Current Facility-Administered Medications  Medication Dose Route Frequency Provider Last Rate Last Dose  . alteplase (CATHFLO ACTIVASE) injection 2 mg  2 mg Intracatheter Once PRN Nicholas Lose, MD      . sodium chloride flush (NS) 0.9 % injection 10 mL  10 mL Intracatheter PRN Nicholas Lose, MD   10 mL at 08/09/18 1256    PHYSICAL EXAMINATION: ECOG PERFORMANCE STATUS: 1 - Symptomatic but completely ambulatory  Vitals:   08/09/18 1215  BP: (!) 136/98  Pulse: 67  Resp: 18  Temp: 98.2 F (36.8 C)  SpO2: 100%   Filed Weights   08/09/18 1215  Weight: 203 lb 1.6 oz (92.1 kg)    GENERAL:alert, no distress and comfortable SKIN: skin color, texture, turgor are normal, no rashes or significant lesions EYES: normal, Conjunctiva are pink and non-injected, sclera clear OROPHARYNX: Slight swelling towards the back of the right lower jaw.  There is no redness or mucosal breakdown. NECK: supple, thyroid normal size, non-tender, without nodularity LYMPH:  no palpable lymphadenopathy in the cervical, axillary or inguinal LUNGS: clear to auscultation and percussion with normal breathing effort HEART: regular rate & rhythm and no murmurs and no lower extremity edema ABDOMEN:abdomen soft, non-tender and normal bowel sounds MUSCULOSKELETAL:no cyanosis of digits and no clubbing    NEURO: alert & oriented x 3 with fluent speech, no focal motor/sensory deficits EXTREMITIES: No lower extremity edema   LABORATORY DATA:  I have reviewed the data as listed CMP Latest Ref Rng & Units 08/09/2018 08/03/2018 07/26/2018  Glucose 70 - 99 mg/dL 94 140(H) 134(H)  BUN 6 - 20 mg/dL '7 10 10  '$ Creatinine 0.44 - 1.00 mg/dL 0.72 0.63 0.76  Sodium 135 - 145 mmol/L 143 141 141  Potassium 3.5 - 5.1 mmol/L 3.7 3.7 3.1(L)  Chloride 98 - 111 mmol/L 110 108 108  CO2 22 - 32 mmol/L '26 22 25  '$ Calcium 8.9 - 10.3 mg/dL 9.0 8.8(L) 8.9  Total Protein 6.5 - 8.1 g/dL 6.9 7.2 6.9  Total Bilirubin 0.3 - 1.2 mg/dL <0.2(L) 0.4 <0.2(L)  Alkaline Phos 38 - 126 U/L 70 71 117  AST 15 - 41 U/L 17 18 14(L)  ALT 0 - 44 U/L 23 32 21    Lab Results  Component Value Date   WBC 2.5 (L) 08/09/2018   HGB 10.9 (L) 08/09/2018   HCT 33.3 (L) 08/09/2018   MCV 87.7 08/09/2018   PLT 172 08/09/2018   NEUTROABS 0.4 (LL) 08/09/2018    ASSESSMENT & PLAN:  Malignant neoplasm of upper-outer quadrant of right  breast in female, estrogen receptor negative ([place]) 06/23/2018:Palpable lump in the right breast for 2 months UOQ by mammogram and ultrasound cystic and solid lesion at 10 o'clock position measuring 4.1 cm, axilla negative, 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 Neoadjuvant chemotherapy with Adriamycin and Cytoxan dose dense 4 followed byTaxol and carboplatinweekly 12 2. Followed by breast conserving surgery with sentinel lymph node study  3. Followed by adjuvant radiation therapy ------------------------------------------------------------------------------ Current treatment: Cycle 3 day 1 dose dense Adriamycin and Cytoxan (being held today for neutropenia) Labs have been reviewed  Chemo toxicities:  1.  Mild nausea 2. profound tiredness 3.  Patient was not administered Udenyca injection  and therefore her neutrophil count is only 400 today.  She cannot receive chemotherapy. Neutropenia: Because patient missed growth factor injection.  We will administer Granix daily for the next 4 days.  We will postpone her chemotherapy until next week  Echocardiogram 07/09/2018: EF 55 to 60%  Return to clinic in 1 week for cycle 3    No orders of the defined types were placed in this encounter.  The patient has a good understanding of the overall plan. she agrees with it. she will call with any problems that may develop before the next visit here.   Harriette Ohara, MD 08/09/18

## 2018-08-09 NOTE — Telephone Encounter (Signed)
Gave avs and calendar ° °

## 2018-08-09 NOTE — Assessment & Plan Note (Signed)
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 Neoadjuvant chemotherapy with Adriamycin and Cytoxan dose dense 4 followed byTaxol and carboplatinweekly 12 2. Followed by breast conserving surgery with sentinel lymph node study  3. Followed by adjuvant radiation therapy ------------------------------------------------------------------------------ Current treatment: Cycle 3 day 1 dose dense Adriamycin and Cytoxan (being held today for neutropenia) Labs have been reviewed  Chemo toxicities:  1.  Mild nausea 2. profound tiredness 3.  Patient was not administered Udenyca injection and therefore her neutrophil count is only 400 today.  She cannot receive chemotherapy.  Neutropenia: Because patient missed growth factor injection.  We will administer Granix daily for the next 4 days.  We will postpone her chemotherapy until next week  Echocardiogram 07/09/2018: EF 55 to 60%  Return to clinic in 1 week for cycle 3

## 2018-08-09 NOTE — Telephone Encounter (Signed)
Appt added patient notified per 9/5 sch msg

## 2018-08-10 ENCOUNTER — Inpatient Hospital Stay: Payer: 59

## 2018-08-10 VITALS — BP 141/83 | HR 84 | Temp 98.2°F | Resp 18

## 2018-08-10 DIAGNOSIS — Z95828 Presence of other vascular implants and grafts: Secondary | ICD-10-CM

## 2018-08-10 DIAGNOSIS — C50411 Malignant neoplasm of upper-outer quadrant of right female breast: Secondary | ICD-10-CM | POA: Diagnosis not present

## 2018-08-10 MED ORDER — TBO-FILGRASTIM 480 MCG/0.8ML ~~LOC~~ SOSY
480.0000 ug | PREFILLED_SYRINGE | Freq: Once | SUBCUTANEOUS | Status: AC
  Administered 2018-08-10: 480 ug via SUBCUTANEOUS

## 2018-08-11 ENCOUNTER — Inpatient Hospital Stay: Payer: 59

## 2018-08-11 VITALS — BP 141/92 | HR 92 | Temp 98.7°F | Resp 18

## 2018-08-11 DIAGNOSIS — C50411 Malignant neoplasm of upper-outer quadrant of right female breast: Secondary | ICD-10-CM | POA: Diagnosis not present

## 2018-08-11 DIAGNOSIS — Z95828 Presence of other vascular implants and grafts: Secondary | ICD-10-CM

## 2018-08-11 MED ORDER — TBO-FILGRASTIM 480 MCG/0.8ML ~~LOC~~ SOSY
480.0000 ug | PREFILLED_SYRINGE | Freq: Once | SUBCUTANEOUS | Status: AC
  Administered 2018-08-11: 480 ug via SUBCUTANEOUS

## 2018-08-11 MED ORDER — TBO-FILGRASTIM 480 MCG/0.8ML ~~LOC~~ SOSY
PREFILLED_SYRINGE | SUBCUTANEOUS | Status: AC
  Filled 2018-08-11: qty 0.8

## 2018-08-11 NOTE — Patient Instructions (Signed)
Tbo-Filgrastim injection What is this medicine? TBO-FILGRASTIM (T B O fil GRA stim) is a granulocyte colony-stimulating factor that stimulates the growth of neutrophils, a type of white blood cell important in the body's fight against infection. It is used to reduce the incidence of fever and infection in patients with certain types of cancer who are receiving chemotherapy that affects the bone marrow. This medicine may be used for other purposes; ask your health care provider or pharmacist if you have questions. COMMON BRAND NAME(S): Granix What should I tell my health care provider before I take this medicine? They need to know if you have any of these conditions: -bone scan or tests planned -kidney disease -sickle cell anemia -an unusual or allergic reaction to tbo-filgrastim, filgrastim, pegfilgrastim, other medicines, foods, dyes, or preservatives -pregnant or trying to get pregnant -breast-feeding How should I use this medicine? This medicine is for injection under the skin. If you get this medicine at home, you will be taught how to prepare and give this medicine. Refer to the Instructions for Use that come with your medication packaging. Use exactly as directed. Take your medicine at regular intervals. Do not take your medicine more often than directed. It is important that you put your used needles and syringes in a special sharps container. Do not put them in a trash can. If you do not have a sharps container, call your pharmacist or healthcare provider to get one. Talk to your pediatrician regarding the use of this medicine in children. Special care may be needed. Overdosage: If you think you have taken too much of this medicine contact a poison control center or emergency room at once. NOTE: This medicine is only for you. Do not share this medicine with others. What if I miss a dose? It is important not to miss your dose. Call your doctor or health care professional if you miss a  dose. What may interact with this medicine? This medicine may interact with the following medications: -medicines that may cause a release of neutrophils, such as lithium This list may not describe all possible interactions. Give your health care provider a list of all the medicines, herbs, non-prescription drugs, or dietary supplements you use. Also tell them if you smoke, drink alcohol, or use illegal drugs. Some items may interact with your medicine. What should I watch for while using this medicine? You may need blood work done while you are taking this medicine. What side effects may I notice from receiving this medicine? Side effects that you should report to your doctor or health care professional as soon as possible: -allergic reactions like skin rash, itching or hives, swelling of the face, lips, or tongue -blood in the urine -dark urine -dizziness -fast heartbeat -feeling faint -shortness of breath or breathing problems -signs and symptoms of infection like fever or chills; cough; or sore throat -signs and symptoms of kidney injury like trouble passing urine or change in the amount of urine -stomach or side pain, or pain at the shoulder -sweating -swelling of the legs, ankles, or abdomen -tiredness Side effects that usually do not require medical attention (report to your doctor or health care professional if they continue or are bothersome): -bone pain -headache -muscle pain -vomiting This list may not describe all possible side effects. Call your doctor for medical advice about side effects. You may report side effects to FDA at 1-800-FDA-1088. Where should I keep my medicine? Keep out of the reach of children. Store in a refrigerator between   2 and 8 degrees C (36 and 46 degrees F). Keep in carton to protect from light. Throw away this medicine if it is left out of the refrigerator for more than 5 consecutive days. Throw away any unused medicine after the expiration  date. NOTE: This sheet is a summary. It may not cover all possible information. If you have questions about this medicine, talk to your doctor, pharmacist, or health care provider.  2018 Elsevier/Gold Standard (2016-01-07 19:07:04)  

## 2018-08-11 NOTE — Progress Notes (Signed)
Pt. Tolerated injection well, No further problems or concerns noted. 

## 2018-08-12 ENCOUNTER — Other Ambulatory Visit: Payer: Self-pay

## 2018-08-12 ENCOUNTER — Inpatient Hospital Stay: Payer: 59

## 2018-08-12 VITALS — BP 157/98 | HR 93 | Temp 98.5°F | Resp 18

## 2018-08-12 DIAGNOSIS — C50411 Malignant neoplasm of upper-outer quadrant of right female breast: Secondary | ICD-10-CM

## 2018-08-12 DIAGNOSIS — Z95828 Presence of other vascular implants and grafts: Secondary | ICD-10-CM

## 2018-08-12 DIAGNOSIS — Z171 Estrogen receptor negative status [ER-]: Secondary | ICD-10-CM

## 2018-08-12 MED ORDER — TBO-FILGRASTIM 480 MCG/0.8ML ~~LOC~~ SOSY
480.0000 ug | PREFILLED_SYRINGE | Freq: Once | SUBCUTANEOUS | Status: AC
  Administered 2018-08-12: 480 ug via SUBCUTANEOUS

## 2018-08-12 MED ORDER — OXYCODONE-ACETAMINOPHEN 5-325 MG PO TABS: 1.0000 | ORAL_TABLET | Freq: Four times a day (QID) | ORAL | 0 refills | Status: DC | PRN

## 2018-08-12 MED ORDER — TBO-FILGRASTIM 480 MCG/0.8ML ~~LOC~~ SOSY
PREFILLED_SYRINGE | SUBCUTANEOUS | Status: AC
  Filled 2018-08-12: qty 0.8

## 2018-08-12 MED ORDER — OXYCODONE-ACETAMINOPHEN 5-325 MG PO TABS: 1.0000 | ORAL_TABLET | Freq: Four times a day (QID) | ORAL | Status: DC | PRN

## 2018-08-12 MED FILL — OXYCODONE-ACETAMINOPHEN 5-3: 5-325 | 5 days supply | Qty: 20 | Fill #0

## 2018-08-12 NOTE — Patient Instructions (Signed)
Tbo-Filgrastim injection What is this medicine? TBO-FILGRASTIM (T B O fil GRA stim) is a granulocyte colony-stimulating factor that stimulates the growth of neutrophils, a type of white blood cell important in the body's fight against infection. It is used to reduce the incidence of fever and infection in patients with certain types of cancer who are receiving chemotherapy that affects the bone marrow. This medicine may be used for other purposes; ask your health care provider or pharmacist if you have questions. COMMON BRAND NAME(S): Granix What should I tell my health care provider before I take this medicine? They need to know if you have any of these conditions: -bone scan or tests planned -kidney disease -sickle cell anemia -an unusual or allergic reaction to tbo-filgrastim, filgrastim, pegfilgrastim, other medicines, foods, dyes, or preservatives -pregnant or trying to get pregnant -breast-feeding How should I use this medicine? This medicine is for injection under the skin. If you get this medicine at home, you will be taught how to prepare and give this medicine. Refer to the Instructions for Use that come with your medication packaging. Use exactly as directed. Take your medicine at regular intervals. Do not take your medicine more often than directed. It is important that you put your used needles and syringes in a special sharps container. Do not put them in a trash can. If you do not have a sharps container, call your pharmacist or healthcare provider to get one. Talk to your pediatrician regarding the use of this medicine in children. Special care may be needed. Overdosage: If you think you have taken too much of this medicine contact a poison control center or emergency room at once. NOTE: This medicine is only for you. Do not share this medicine with others. What if I miss a dose? It is important not to miss your dose. Call your doctor or health care professional if you miss a  dose. What may interact with this medicine? This medicine may interact with the following medications: -medicines that may cause a release of neutrophils, such as lithium This list may not describe all possible interactions. Give your health care provider a list of all the medicines, herbs, non-prescription drugs, or dietary supplements you use. Also tell them if you smoke, drink alcohol, or use illegal drugs. Some items may interact with your medicine. What should I watch for while using this medicine? You may need blood work done while you are taking this medicine. What side effects may I notice from receiving this medicine? Side effects that you should report to your doctor or health care professional as soon as possible: -allergic reactions like skin rash, itching or hives, swelling of the face, lips, or tongue -blood in the urine -dark urine -dizziness -fast heartbeat -feeling faint -shortness of breath or breathing problems -signs and symptoms of infection like fever or chills; cough; or sore throat -signs and symptoms of kidney injury like trouble passing urine or change in the amount of urine -stomach or side pain, or pain at the shoulder -sweating -swelling of the legs, ankles, or abdomen -tiredness Side effects that usually do not require medical attention (report to your doctor or health care professional if they continue or are bothersome): -bone pain -headache -muscle pain -vomiting This list may not describe all possible side effects. Call your doctor for medical advice about side effects. You may report side effects to FDA at 1-800-FDA-1088. Where should I keep my medicine? Keep out of the reach of children. Store in a refrigerator between   2 and 8 degrees C (36 and 46 degrees F). Keep in carton to protect from light. Throw away this medicine if it is left out of the refrigerator for more than 5 consecutive days. Throw away any unused medicine after the expiration  date. NOTE: This sheet is a summary. It may not cover all possible information. If you have questions about this medicine, talk to your doctor, pharmacist, or health care provider.  2018 Elsevier/Gold Standard (2016-01-07 19:07:04)  

## 2018-08-12 NOTE — Telephone Encounter (Signed)
Spoke with patient.  Patient having severe bone pain after multiple injections of Granix.  Patient states, "I can't sleep, I have tried everything I can with no relief."  Patient taking Claritin and Norco with no relief.  Verbal order from Dr. Lindi Adie for Percocet 5/325mg  1 tablet PO Q 6 hours as needed for severe pain, #20 with no refills.  Nurse handed patient Rx script in office.

## 2018-08-12 NOTE — Progress Notes (Signed)
Pt. Tolerated injection well, Pt. Stated she was feeling body aches from injections stated she called Dr. Geralyn Flash office to see if he can prescribed her something for pain. Kathlee Nations Dr Lindi Adie  Ambulatory RN called Pt. While getting injection deferred Pt to waiting area by the [facility].

## 2018-08-15 NOTE — Assessment & Plan Note (Signed)
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 Neoadjuvant chemotherapy with Adriamycin and Cytoxan dose dense 4 followed byTaxol and carboplatinweekly 12 2. Followed by breast conserving surgery with sentinel lymph node study  3. Followed by adjuvant radiation therapy ------------------------------------------------------------------------------ Current treatment: Cycle 3 day1dose dense Adriamycin and Cytoxan (held last week for neutropenia) Labs have been reviewed  Chemo toxicities: 1.Mild nausea 2.profound tiredness 3.  Patient was not administered Udenyca injection and therefore her neutrophil count was only 400 last week.   Echocardiogram 07/09/2018: EF 55 to 60% Return to clinic in 2 weeks for cycle 4

## 2018-08-16 ENCOUNTER — Inpatient Hospital Stay: Payer: 59

## 2018-08-16 ENCOUNTER — Inpatient Hospital Stay (HOSPITAL_BASED_OUTPATIENT_CLINIC_OR_DEPARTMENT_OTHER): Payer: 59 | Admitting: Hematology and Oncology

## 2018-08-16 DIAGNOSIS — R7989 Other specified abnormal findings of blood chemistry: Secondary | ICD-10-CM

## 2018-08-16 DIAGNOSIS — C50411 Malignant neoplasm of upper-outer quadrant of right female breast: Secondary | ICD-10-CM

## 2018-08-16 DIAGNOSIS — Z171 Estrogen receptor negative status [ER-]: Secondary | ICD-10-CM

## 2018-08-16 DIAGNOSIS — Z79899 Other long term (current) drug therapy: Secondary | ICD-10-CM | POA: Diagnosis not present

## 2018-08-16 DIAGNOSIS — Z95828 Presence of other vascular implants and grafts: Secondary | ICD-10-CM

## 2018-08-16 LAB — CBC WITH DIFFERENTIAL (CANCER CENTER ONLY)
BASOS ABS: 0.2 10*3/uL — AB (ref 0.0–0.1)
BASOS PCT: 1 %
Eosinophils Absolute: 0 10*3/uL (ref 0.0–0.5)
Eosinophils Relative: 0 %
HCT: 36 % (ref 34.8–46.6)
Hemoglobin: 11.9 g/dL (ref 11.6–15.9)
Lymphocytes Relative: 19 %
Lymphs Abs: 3.8 10*3/uL — ABNORMAL HIGH (ref 0.9–3.3)
MCH: 29.2 pg (ref 25.1–34.0)
MCHC: 33.1 g/dL (ref 31.5–36.0)
MCV: 88.2 fL (ref 79.5–101.0)
MONO ABS: 2.3 10*3/uL — AB (ref 0.1–0.9)
Monocytes Relative: 11 %
Neutro Abs: 13.6 10*3/uL — ABNORMAL HIGH (ref 1.5–6.5)
Neutrophils Relative %: 69 %
PLATELETS: 197 10*3/uL (ref 145–400)
RBC: 4.08 MIL/uL (ref 3.70–5.45)
RDW: 16.5 % — AB (ref 11.2–14.5)
WBC Count: 19.8 10*3/uL — ABNORMAL HIGH (ref 3.9–10.3)

## 2018-08-16 LAB — CMP (CANCER CENTER ONLY)
ALBUMIN: 3.7 g/dL (ref 3.5–5.0)
ALK PHOS: 131 U/L — AB (ref 38–126)
ALT: 82 U/L — ABNORMAL HIGH (ref 0–44)
ANION GAP: 8 (ref 5–15)
AST: 51 U/L — AB (ref 15–41)
BUN: 10 mg/dL (ref 6–20)
CALCIUM: 8.8 mg/dL — AB (ref 8.9–10.3)
CO2: 28 mmol/L (ref 22–32)
Chloride: 107 mmol/L (ref 98–111)
Creatinine: 0.84 mg/dL (ref 0.44–1.00)
GFR, Est AFR Am: >60 mL/min (ref >60)
GFR, Estimated: >60 mL/min (ref >60)
GLUCOSE: 156 mg/dL — AB (ref 70–99)
Potassium: 3.6 mmol/L (ref 3.5–5.1)
Sodium: 143 mmol/L (ref 135–145)
TOTAL PROTEIN: 6.8 g/dL (ref 6.5–8.1)

## 2018-08-16 MED ORDER — DOXORUBICIN HCL CHEMO IV INJECTION 2 MG/ML
60.0000 mg/m2 | Freq: Once | INTRAVENOUS | Status: AC
  Administered 2018-08-16: 128 mg via INTRAVENOUS
  Filled 2018-08-16: qty 64

## 2018-08-16 MED ORDER — PALONOSETRON HCL INJECTION 0.25 MG/5ML
0.2500 mg | Freq: Once | INTRAVENOUS | Status: AC
  Administered 2018-08-16: 0.25 mg via INTRAVENOUS

## 2018-08-16 MED ORDER — HEPARIN SOD (PORK) LOCK FLUSH 100 UNIT/ML IV SOLN
500.0000 [IU] | Freq: Once | INTRAVENOUS | Status: AC | PRN
  Administered 2018-08-16: 500 [IU]
  Filled 2018-08-16: qty 5

## 2018-08-16 MED ORDER — SODIUM CHLORIDE 0.9 % IV SOLN
Freq: Once | INTRAVENOUS | Status: AC
  Administered 2018-08-16: 15:00:00 via INTRAVENOUS
  Filled 2018-08-16: qty 5

## 2018-08-16 MED ORDER — SODIUM CHLORIDE 0.9 % IV SOLN
600.0000 mg/m2 | Freq: Once | INTRAVENOUS | Status: AC
  Administered 2018-08-16: 1280 mg via INTRAVENOUS
  Filled 2018-08-16: qty 64

## 2018-08-16 MED ORDER — PALONOSETRON HCL INJECTION 0.25 MG/5ML
INTRAVENOUS | Status: AC
  Filled 2018-08-16: qty 5

## 2018-08-16 MED ORDER — SODIUM CHLORIDE 0.9% FLUSH
10.0000 mL | INTRAVENOUS | Status: DC | PRN
  Administered 2018-08-16: 10 mL
  Filled 2018-08-16: qty 10

## 2018-08-16 MED ORDER — SODIUM CHLORIDE 0.9% FLUSH
10.0000 mL | INTRAVENOUS | Status: DC | PRN
  Filled 2018-08-16: qty 10

## 2018-08-16 MED ORDER — SODIUM CHLORIDE 0.9 % IV SOLN
Freq: Once | INTRAVENOUS | Status: AC
  Administered 2018-08-16: 14:00:00 via INTRAVENOUS
  Filled 2018-08-16: qty 250

## 2018-08-16 NOTE — Progress Notes (Signed)
Per Dr Lindi Adie OK for cycle 3 of AC with ALT 82, AST 51, Alk phos 131

## 2018-08-16 NOTE — Progress Notes (Signed)
Patient Care Team: Tysinger, Camelia Eng, PA-C as PCP - General (Family Medicine) Excell Seltzer, MD as Consulting Physician (General Surgery) Nicholas Lose, MD as Consulting Physician (Hematology and Oncology) Gery Pray, MD as Consulting Physician (Radiation Oncology)  DIAGNOSIS:  Encounter Diagnosis  Name Primary?  . 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 The Common Hereditary Cancers Panel offered by [facility] includes sequencing and/or deletion duplication testing of the following 47 genes: APC, ATM, AXIN2, BARD1, BMPR1A, BRCA1, BRCA2, BRIP1, CDH1, CDKN2A (p14ARF), CDKN2A (p16INK4a), CKD4, CHEK2, CTNNA1, DICER1, EPCAM (Deletion/duplication testing only), GREM1 (promoter region deletion/duplication testing only), KIT, MEN1, MLH1, MSH2, MSH3, MSH6, MUTYH, NBN, NF1, NHTL1, PALB2, PDGFRA, PMS2, POLD1, POLE, PTEN, RAD50, RAD51C, RAD51D, SDHB, SDHC, SDHD, SMAD4, SMARCA4. STK11, TP53, TSC1, TSC2, and VHL.  The following genes were evaluated for sequence changes only: SDHA and HOXB13 c.251G>A variant only.  The report date is 07/23/2018.     CHIEF COMPLIANT: Cycle 3 dose dense Adriamycin and Cytoxan  INTERVAL HISTORY: KERINA SIMONEAU is a 47 year old with above-mentioned history of right breast cancer  currently neoadjuvant chemotherapy and today cycle 3 of dose dense Adriamycin and Cytoxan.  Last week chemo was held because of neutropenia.  The neutropenia was a result of not receiving Neulasta.  She received Granix last week and is here today to reinitiate her chemotherapy.  She developed a lot of bone discomfort related to Granix.  She had to take Percocets for this for pain relief.  REVIEW OF SYSTEMS:   Constitutional: Denies fevers, chills or abnormal weight loss Eyes: Denies blurriness of vision Ears, nose, mouth, throat, and face: Denies mucositis or sore throat Respiratory: Denies cough, dyspnea or wheezes Cardiovascular: Denies palpitation, chest discomfort Gastrointestinal:  Denies nausea, heartburn or change in bowel habits Skin: Denies abnormal skin rashes Lymphatics: Denies new lymphadenopathy or easy bruising Neurological:Denies numbness, tingling or new weaknesses Behavioral/Psych: Mood is stable, no new changes  Extremities: No lower extremity edema   All other systems were reviewed with the patient and are negative.  I have reviewed the past medical history, past surgical history, social history and family history with the patient and they are unchanged from previous note.  ALLERGIES:  has No Known Allergies.  MEDICATIONS:  Current Outpatient Medications  Medication Sig Dispense Refill  . acetaminophen (TYLENOL) 500 MG tablet Take 500 mg by mouth every 6 (six) hours as needed for mild pain.     Marland Kitchen amoxicillin (AMOXIL) 500 MG capsule Take 1 capsule (500 mg total) by mouth 3 (three) times daily. 21 capsule 0  . HYDROcodone-acetaminophen (NORCO/VICODIN) 5-325 MG tablet Take 1-2 tablets by mouth every 6 (six) hours as needed for moderate pain or severe pain. 8 tablet 0  . lidocaine-prilocaine (EMLA) cream Apply to affected area  once 30 g 3  . LORazepam (ATIVAN) 0.5 MG tablet Take 1 tablet (0.5 mg total) by mouth at bedtime as needed for sleep. 30 tablet 0  . naproxen sodium  (ALEVE) 220 MG tablet Take 220 mg by mouth as needed (pain).     . ondansetron (ZOFRAN) 8 MG tablet Take 1 tablet (8 mg total) by mouth 2 (two) times daily as needed. Start on the third day after chemotherapy. 30 tablet 1  . oxyCODONE-acetaminophen (PERCOCET/ROXICET) 5-325 MG tablet Take 1 tablet by mouth every 6 (six) hours as needed for severe pain. 20 tablet 0  . prochlorperazine (COMPAZINE) 10 MG tablet Take 1 tablet (10 mg total) by mouth every 6 (six) hours as needed (Nausea or vomiting). 30 tablet 1  . VITAMIN E PO Take by mouth daily.      No current facility-administered medications for this visit.    Facility-Administered Medications Ordered in Other Visits  Medication Dose Route Frequency Provider Last Rate Last Dose  . cyclophosphamide (CYTOXAN) 1,280 mg in sodium chloride 0.9 % 250 mL chemo infusion  600 mg/m2 (Treatment Plan Recorded) Intravenous Once Nicholas Lose, MD      . DOXOrubicin (ADRIAMYCIN) chemo injection 128 mg  60 mg/m2 (Treatment Plan Recorded) Intravenous Once Nicholas Lose, MD      . fosaprepitant (EMEND) 150 mg, dexamethasone (DECADRON) 12 mg in sodium chloride 0.9 % 145 mL IVPB   Intravenous Once Nicholas Lose, MD      . heparin lock flush 100 unit/mL  500 Units Intracatheter Once PRN Nicholas Lose, MD      . palonosetron (ALOXI) injection 0.25 mg  0.25 mg Intravenous Once Nicholas Lose, MD      . sodium chloride flush (NS) 0.9 % injection 10 mL  10 mL Intracatheter PRN Nicholas Lose, MD        PHYSICAL EXAMINATION: ECOG PERFORMANCE STATUS: 1 - Symptomatic but completely ambulatory  Vitals:   08/16/18 1100  BP: (!) 143/90  Pulse: 79  Resp: 17  Temp: 98.5 F (36.9 C)  SpO2: 100%   Filed Weights   08/16/18 1100  Weight: 205 lb 1.6 oz (93 kg)    GENERAL:alert, no distress and comfortable SKIN: skin color, texture, turgor are normal, no rashes or significant lesions EYES: normal, Conjunctiva are pink and non-injected, sclera clear OROPHARYNX:no  exudate, no erythema and lips, buccal mucosa, and tongue normal  NECK: supple, thyroid normal size, non-tender, without nodularity LYMPH:  no palpable lymphadenopathy in the cervical, axillary or inguinal LUNGS: clear to auscultation and percussion with normal breathing effort HEART: regular rate & rhythm and no murmurs and no lower extremity edema ABDOMEN:abdomen soft, non-tender and normal bowel sounds MUSCULOSKELETAL:no cyanosis of digits and no clubbing  NEURO: alert & oriented x 3 with fluent speech, no focal motor/sensory deficits EXTREMITIES: No lower extremity edema   LABORATORY DATA:  I have reviewed the data as listed CMP Latest Ref Rng & Units 08/16/2018 08/09/2018 08/03/2018  Glucose 70 - 99 mg/dL 156(H) 94 140(H)  BUN 6 - 20 mg/dL _0 Creatinine 0.44 - 1.00 mg/dL 0.84 0.72 0.63  Sodium 135 - 145 mmol/L 143 143 141  Potassium 3.5 - 5.1 mmol/L 3.6 3.7 3.7  Chloride 98 - 111 mmol/L 107 110 108  CO2 22 - 32 mmol/L _1 Calcium 8.9 - 10.3 mg/dL 8.8(L) 9.0 8.8(L)  Total Protein 6.5 - 8.1 g/dL 6.8 6.9 7.2  Total Bilirubin 0.3 - 1.2 mg/dL <0.2(L) <0.2(L) 0.4  Alkaline Phos 38 - 126 U/L 131(H) 70 71  AST 15 - 41 U/L 51(H) 17 18  ALT 0 - 44 U/L 82(H) 23 32    Lab Results  Component Value Date   WBC 19.8 (H) 08/16/2018   HGB 11.9 08/16/2018   HCT 36.0 08/16/2018   MCV 88.2 08/16/2018   PLT 197 08/16/2018   NEUTROABS 13.6 (H) 08/16/2018    ASSESSMENT & PLAN:  Malignant neoplasm of upper-outer quadrant of right breast in female, estrogen receptor negative ([place]) 06/23/2018:Palpable lump in the right breast for 2 months UOQ by mammogram and ultrasound cystic and solid lesion at 10 o'clock position measuring 4.1 cm, axilla negative, 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 Neoadjuvant chemotherapy with Adriamycin and Cytoxan dose dense 4 followed byTaxol and  carboplatinweekly 12 2. Followed by breast conserving surgery with sentinel lymph node study  3. Followed by adjuvant radiation therapy ------------------------------------------------------------------------------ Current treatment: Cycle 3 day1dose dense Adriamycin and Cytoxan (held  last week for neutropenia) Labs have been reviewed  Chemo toxicities: 1.Mild nausea 2.profound tiredness 3.  Patient was not administered Udenyca injection and therefore her neutrophil count was only 400 last week.   4.  Bone pain from Granix  5.  Elevated LFTs: We will continue with the treatment and will monitor it closely.  echocardiogram 07/09/2018: EF 55 to 60% Return to clinic in 2 weeks for cycle 4   No orders of the defined types were placed in this encounter.  The patient has a good understanding of the overall plan. she agrees with it. she will call with any problems that may develop before the next visit here.   Harriette Ohara, MD 08/16/18

## 2018-08-16 NOTE — Patient Instructions (Signed)
[facility] Discharge Instructions for Patients Receiving Chemotherapy  Today you received the following chemotherapy agents Adriamycin and Cytoxan  To help prevent nausea and vomiting after your treatment, we encourage you to take your nausea medication as directed.  If you develop nausea and vomiting that is not controlled by your nausea medication, call the clinic.   BELOW ARE SYMPTOMS THAT SHOULD BE REPORTED IMMEDIATELY:  *FEVER GREATER THAN 100.5 F  *CHILLS WITH OR WITHOUT FEVER  NAUSEA AND VOMITING THAT IS NOT CONTROLLED WITH YOUR NAUSEA MEDICATION  *UNUSUAL SHORTNESS OF BREATH  *UNUSUAL BRUISING OR BLEEDING  TENDERNESS IN MOUTH AND THROAT WITH OR WITHOUT PRESENCE OF ULCERS  *URINARY PROBLEMS  *BOWEL PROBLEMS  UNUSUAL RASH Items with * indicate a potential emergency and should be followed up as soon as possible.  Feel free to call the clinic should you have any questions or concerns. The clinic phone number is [phone].  Please show the CHEMO ALERT CARD at check-in to the Emergency Department and triage nurse.   

## 2018-08-17 ENCOUNTER — Encounter: Payer: Self-pay | Admitting: General Practice

## 2018-08-18 ENCOUNTER — Inpatient Hospital Stay: Payer: 59

## 2018-08-18 ENCOUNTER — Encounter: Payer: Self-pay | Admitting: *Deleted

## 2018-08-18 VITALS — BP 128/90 | HR 80 | Temp 98.3°F | Resp 18

## 2018-08-18 DIAGNOSIS — Z171 Estrogen receptor negative status [ER-]: Secondary | ICD-10-CM

## 2018-08-18 DIAGNOSIS — C50411 Malignant neoplasm of upper-outer quadrant of right female breast: Secondary | ICD-10-CM

## 2018-08-18 MED ORDER — PEGFILGRASTIM-CBQV 6 MG/0.6ML ~~LOC~~ SOSY
PREFILLED_SYRINGE | SUBCUTANEOUS | Status: AC
  Filled 2018-08-18: qty 0.6

## 2018-08-18 MED ORDER — PEGFILGRASTIM-CBQV 6 MG/0.6ML ~~LOC~~ SOSY
6.0000 mg | PREFILLED_SYRINGE | Freq: Once | SUBCUTANEOUS | Status: AC
  Administered 2018-08-18: 6 mg via SUBCUTANEOUS

## 2018-08-18 NOTE — Progress Notes (Signed)
[place] [employer]  Holiday representative received referral form financial advocate for additional financial concerns.  CSW met with patient in [place] office to offer support and assess for needs.  CSW and patient reviewed applications for [employer] [place] and [employer].  Patient plans to collected required information and contact CSW.  CSW provided contact information and will continue to follow and support as needed.    Johnnye Lana, MSW, LCSW, OSW-C Clinical Social Worker [facility] [phone]

## 2018-08-18 NOTE — Patient Instructions (Signed)
Tbo-Filgrastim injection What is this medicine? TBO-FILGRASTIM (T B O fil GRA stim) is a granulocyte colony-stimulating factor that stimulates the growth of neutrophils, a type of white blood cell important in the body's fight against infection. It is used to reduce the incidence of fever and infection in patients with certain types of cancer who are receiving chemotherapy that affects the bone marrow. This medicine may be used for other purposes; ask your health care provider or pharmacist if you have questions. COMMON BRAND NAME(S): Granix What should I tell my health care provider before I take this medicine? They need to know if you have any of these conditions: -bone scan or tests planned -kidney disease -sickle cell anemia -an unusual or allergic reaction to tbo-filgrastim, filgrastim, pegfilgrastim, other medicines, foods, dyes, or preservatives -pregnant or trying to get pregnant -breast-feeding How should I use this medicine? This medicine is for injection under the skin. If you get this medicine at home, you will be taught how to prepare and give this medicine. Refer to the Instructions for Use that come with your medication packaging. Use exactly as directed. Take your medicine at regular intervals. Do not take your medicine more often than directed. It is important that you put your used needles and syringes in a special sharps container. Do not put them in a trash can. If you do not have a sharps container, call your pharmacist or healthcare provider to get one. Talk to your pediatrician regarding the use of this medicine in children. Special care may be needed. Overdosage: If you think you have taken too much of this medicine contact a poison control center or emergency room at once. NOTE: This medicine is only for you. Do not share this medicine with others. What if I miss a dose? It is important not to miss your dose. Call your doctor or health care professional if you miss a  dose. What may interact with this medicine? This medicine may interact with the following medications: -medicines that may cause a release of neutrophils, such as lithium This list may not describe all possible interactions. Give your health care provider a list of all the medicines, herbs, non-prescription drugs, or dietary supplements you use. Also tell them if you smoke, drink alcohol, or use illegal drugs. Some items may interact with your medicine. What should I watch for while using this medicine? You may need blood work done while you are taking this medicine. What side effects may I notice from receiving this medicine? Side effects that you should report to your doctor or health care professional as soon as possible: -allergic reactions like skin rash, itching or hives, swelling of the face, lips, or tongue -blood in the urine -dark urine -dizziness -fast heartbeat -feeling faint -shortness of breath or breathing problems -signs and symptoms of infection like fever or chills; cough; or sore throat -signs and symptoms of kidney injury like trouble passing urine or change in the amount of urine -stomach or side pain, or pain at the shoulder -sweating -swelling of the legs, ankles, or abdomen -tiredness Side effects that usually do not require medical attention (report to your doctor or health care professional if they continue or are bothersome): -bone pain -headache -muscle pain -vomiting This list may not describe all possible side effects. Call your doctor for medical advice about side effects. You may report side effects to FDA at 1-800-FDA-1088. Where should I keep my medicine? Keep out of the reach of children. Store in a refrigerator between   2 and 8 degrees C (36 and 46 degrees F). Keep in carton to protect from light. Throw away this medicine if it is left out of the refrigerator for more than 5 consecutive days. Throw away any unused medicine after the expiration  date. NOTE: This sheet is a summary. It may not cover all possible information. If you have questions about this medicine, talk to your doctor, pharmacist, or health care provider.  2018 Elsevier/Gold Standard (2016-01-07 19:07:04)  

## 2018-08-23 ENCOUNTER — Ambulatory Visit: Payer: 59

## 2018-08-23 ENCOUNTER — Other Ambulatory Visit: Payer: 59

## 2018-08-23 ENCOUNTER — Ambulatory Visit: Payer: 59 | Admitting: Hematology and Oncology

## 2018-08-29 NOTE — Assessment & Plan Note (Signed)
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 Neoadjuvant chemotherapy with Adriamycin and Cytoxan dose dense 4 followed byTaxol and carboplatinweekly 12 2. Followed by breast conserving surgery with sentinel lymph node study  3. Followed by adjuvant radiation therapy ------------------------------------------------------------------------------ Current treatment:Cycle 4 dense Adriamycin and Cytoxan Labs have been reviewed  Chemo toxicities: 1.Mild nausea 2.profound tiredness 3.Patient was not administered Udenycainjection and therefore her neutrophil count was only 400 last week.  4.  Bone pain from Granix  5.  Elevated LFTs: We will continue with the treatment and will monitor it closely.  echocardiogram 07/09/2018: EF 55 to 60% Return to clinic in2 weeks for cycle 1 Taxol

## 2018-08-30 ENCOUNTER — Inpatient Hospital Stay: Payer: 59

## 2018-08-30 ENCOUNTER — Inpatient Hospital Stay (HOSPITAL_BASED_OUTPATIENT_CLINIC_OR_DEPARTMENT_OTHER): Payer: 59 | Admitting: Hematology and Oncology

## 2018-08-30 DIAGNOSIS — Z171 Estrogen receptor negative status [ER-]: Secondary | ICD-10-CM

## 2018-08-30 DIAGNOSIS — C50411 Malignant neoplasm of upper-outer quadrant of right female breast: Secondary | ICD-10-CM | POA: Diagnosis not present

## 2018-08-30 DIAGNOSIS — Z95828 Presence of other vascular implants and grafts: Secondary | ICD-10-CM

## 2018-08-30 DIAGNOSIS — R7989 Other specified abnormal findings of blood chemistry: Secondary | ICD-10-CM | POA: Diagnosis not present

## 2018-08-30 DIAGNOSIS — Z79899 Other long term (current) drug therapy: Secondary | ICD-10-CM

## 2018-08-30 LAB — CBC WITH DIFFERENTIAL (CANCER CENTER ONLY)
BASOS PCT: 1 %
Basophils Absolute: 0.1 10*3/uL (ref 0.0–0.1)
EOS ABS: 0 10*3/uL (ref 0.0–0.5)
Eosinophils Relative: 0 %
HCT: 31.5 % — ABNORMAL LOW (ref 34.8–46.6)
HEMOGLOBIN: 10.7 g/dL — AB (ref 11.6–15.9)
Lymphocytes Relative: 25 %
Lymphs Abs: 2.5 10*3/uL (ref 0.9–3.3)
MCH: 29.9 pg (ref 25.1–34.0)
MCHC: 33.9 g/dL (ref 31.5–36.0)
MCV: 88.2 fL (ref 79.5–101.0)
MONO ABS: 1.6 10*3/uL — AB (ref 0.1–0.9)
MONOS PCT: 16 %
NEUTROS PCT: 58 %
Neutro Abs: 5.9 10*3/uL (ref 1.5–6.5)
PLATELETS: 237 10*3/uL (ref 145–400)
RBC: 3.58 MIL/uL — ABNORMAL LOW (ref 3.70–5.45)
RDW: 16.8 % — AB (ref 11.2–14.5)
WBC Count: 10.2 10*3/uL (ref 3.9–10.3)

## 2018-08-30 LAB — CMP (CANCER CENTER ONLY)
ALK PHOS: 94 U/L (ref 38–126)
ALT: 21 U/L (ref 0–44)
AST: 16 U/L (ref 15–41)
Albumin: 3.7 g/dL (ref 3.5–5.0)
Anion gap: 7 (ref 5–15)
BUN: 9 mg/dL (ref 6–20)
CALCIUM: 8.7 mg/dL — AB (ref 8.9–10.3)
CO2: 26 mmol/L (ref 22–32)
CREATININE: 0.77 mg/dL (ref 0.44–1.00)
Chloride: 110 mmol/L (ref 98–111)
GFR, Estimated: >60 mL/min (ref >60)
Glucose, Bld: 106 mg/dL — ABNORMAL HIGH (ref 70–99)
Potassium: 3.5 mmol/L (ref 3.5–5.1)
SODIUM: 143 mmol/L (ref 135–145)
Total Bilirubin: <0.2 mg/dL — ABNORMAL LOW (ref 0.3–1.2)
Total Protein: 6.8 g/dL (ref 6.5–8.1)

## 2018-08-30 MED ORDER — HEPARIN SOD (PORK) LOCK FLUSH 100 UNIT/ML IV SOLN
500.0000 [IU] | Freq: Once | INTRAVENOUS | Status: AC | PRN
  Administered 2018-08-30: 500 [IU]
  Filled 2018-08-30: qty 5

## 2018-08-30 MED ORDER — SODIUM CHLORIDE 0.9% FLUSH
10.0000 mL | INTRAVENOUS | Status: DC | PRN
  Administered 2018-08-30: 10 mL
  Filled 2018-08-30: qty 10

## 2018-08-30 MED ORDER — PALONOSETRON HCL INJECTION 0.25 MG/5ML
INTRAVENOUS | Status: AC
  Filled 2018-08-30: qty 5

## 2018-08-30 MED ORDER — SODIUM CHLORIDE 0.9 % IV SOLN
600.0000 mg/m2 | Freq: Once | INTRAVENOUS | Status: AC
  Administered 2018-08-30: 1280 mg via INTRAVENOUS
  Filled 2018-08-30: qty 64

## 2018-08-30 MED ORDER — SODIUM CHLORIDE 0.9 % IV SOLN
Freq: Once | INTRAVENOUS | Status: AC
  Administered 2018-08-30: 12:00:00 via INTRAVENOUS
  Filled 2018-08-30: qty 5

## 2018-08-30 MED ORDER — DOXORUBICIN HCL CHEMO IV INJECTION 2 MG/ML
60.0000 mg/m2 | Freq: Once | INTRAVENOUS | Status: AC
  Administered 2018-08-30: 128 mg via INTRAVENOUS
  Filled 2018-08-30: qty 64

## 2018-08-30 MED ORDER — SODIUM CHLORIDE 0.9 % IV SOLN
Freq: Once | INTRAVENOUS | Status: AC
  Administered 2018-08-30: 11:00:00 via INTRAVENOUS
  Filled 2018-08-30: qty 250

## 2018-08-30 MED ORDER — PALONOSETRON HCL INJECTION 0.25 MG/5ML
0.2500 mg | Freq: Once | INTRAVENOUS | Status: AC
  Administered 2018-08-30: 0.25 mg via INTRAVENOUS

## 2018-08-30 NOTE — Patient Instructions (Signed)
[facility] Discharge Instructions for Patients Receiving Chemotherapy  Today you received the following chemotherapy agents Adriamycin and Cytoxan  To help prevent nausea and vomiting after your treatment, we encourage you to take your nausea medication as directed.  If you develop nausea and vomiting that is not controlled by your nausea medication, call the clinic.   BELOW ARE SYMPTOMS THAT SHOULD BE REPORTED IMMEDIATELY:  *FEVER GREATER THAN 100.5 F  *CHILLS WITH OR WITHOUT FEVER  NAUSEA AND VOMITING THAT IS NOT CONTROLLED WITH YOUR NAUSEA MEDICATION  *UNUSUAL SHORTNESS OF BREATH  *UNUSUAL BRUISING OR BLEEDING  TENDERNESS IN MOUTH AND THROAT WITH OR WITHOUT PRESENCE OF ULCERS  *URINARY PROBLEMS  *BOWEL PROBLEMS  UNUSUAL RASH Items with * indicate a potential emergency and should be followed up as soon as possible.  Feel free to call the clinic should you have any questions or concerns. The clinic phone number is [phone].  Please show the CHEMO ALERT CARD at check-in to the Emergency Department and triage nurse.   

## 2018-08-30 NOTE — Progress Notes (Signed)
Patient Care Team: Tysinger, Camelia Eng, PA-C as PCP - General (Family Medicine) Excell Seltzer, MD as Consulting Physician (General Surgery) Nicholas Lose, MD as Consulting Physician (Hematology and Oncology) Gery Pray, MD as Consulting Physician (Radiation Oncology)  DIAGNOSIS:  Encounter Diagnosis  Name Primary?  . 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 The Common Hereditary Cancers Panel offered by [facility] includes sequencing and/or deletion duplication testing of the following 47 genes: APC, ATM, AXIN2, BARD1, BMPR1A, BRCA1, BRCA2, BRIP1, CDH1, CDKN2A (p14ARF), CDKN2A (p16INK4a), CKD4, CHEK2, CTNNA1, DICER1, EPCAM (Deletion/duplication testing only), GREM1 (promoter region deletion/duplication testing only), KIT, MEN1, MLH1, MSH2, MSH3, MSH6, MUTYH, NBN, NF1, NHTL1, PALB2, PDGFRA, PMS2, POLD1, POLE, PTEN, RAD50, RAD51C, RAD51D, SDHB, SDHC, SDHD, SMAD4, SMARCA4. STK11, TP53, TSC1, TSC2, and VHL.  The following genes were evaluated for sequence changes only: SDHA and HOXB13 c.251G>A variant only.  The report date is 07/23/2018.     CHIEF COMPLIANT: Cycle 4 dose dense Adriamycin and Cytoxan  INTERVAL HISTORY: Tara Rodriguez is a 47 year old with above-mentioned history of right breast cancer who  is here for cycle 4 of dose dense Adriamycin and Cytoxan.  She is tolerating chemo extremely well.  She did not have any nausea or vomiting.  She denies any fatigue or weakness.  REVIEW OF SYSTEMS:   Constitutional: Denies fevers, chills or abnormal weight loss Eyes: Denies blurriness of vision Ears, nose, mouth, throat, and face: Denies mucositis or sore throat Respiratory: Denies cough, dyspnea or wheezes Cardiovascular: Denies palpitation, chest discomfort Gastrointestinal:  Denies nausea, heartburn or change in bowel habits Skin: Denies abnormal skin rashes Lymphatics: Denies new lymphadenopathy or easy bruising Neurological:Denies numbness, tingling or new weaknesses Behavioral/Psych: Mood is stable, no new changes  Extremities: No lower extremity edema   All other systems were reviewed with the patient and are negative.  I have reviewed the past medical history, past surgical history, social history and family history with the patient and they are unchanged from previous note.  ALLERGIES:  has No Known Allergies.  MEDICATIONS:  Current Outpatient Medications  Medication Sig Dispense Refill  . acetaminophen (TYLENOL) 500 MG tablet Take 500 mg by mouth every 6 (six) hours as needed for mild pain.     Marland Kitchen amoxicillin (AMOXIL) 500 MG capsule Take 1 capsule (500 mg total) by mouth 3 (three) times daily. 21 capsule 0  . HYDROcodone-acetaminophen (NORCO/VICODIN) 5-325 MG tablet Take 1-2 tablets by mouth every 6 (six) hours as needed for moderate pain or severe pain. 8 tablet 0  . lidocaine-prilocaine (EMLA) cream Apply to affected area once 30 g 3  . LORazepam (ATIVAN) 0.5 MG tablet Take 1 tablet (0.5 mg total) by mouth at bedtime as needed for sleep. 30 tablet 0  . naproxen sodium (ALEVE)  220 MG tablet Take 220 mg by mouth as needed (pain).     . ondansetron (ZOFRAN) 8 MG tablet Take 1 tablet (8 mg total) by mouth 2 (two) times daily as needed. Start on the third day after chemotherapy.  30 tablet 1  . oxyCODONE-acetaminophen (PERCOCET/ROXICET) 5-325 MG tablet Take 1 tablet by mouth every 6 (six) hours as needed for severe pain. 20 tablet 0  . prochlorperazine (COMPAZINE) 10 MG tablet Take 1 tablet (10 mg total) by mouth every 6 (six) hours as needed (Nausea or vomiting). 30 tablet 1  . VITAMIN E PO Take by mouth daily.      No current facility-administered medications for this visit.     PHYSICAL EXAMINATION: ECOG PERFORMANCE STATUS: 1 - Symptomatic but completely ambulatory  Vitals:   08/30/18 1029  BP: 139/90  Pulse: 75  Resp: 18  Temp: 97.9 F (36.6 C)  SpO2: 100%   Filed Weights   08/30/18 1029  Weight: 205 lb 3.2 oz (93.1 kg)    GENERAL:alert, no distress and comfortable SKIN: skin color, texture, turgor are normal, no rashes or significant lesions EYES: normal, Conjunctiva are pink and non-injected, sclera clear OROPHARYNX:no exudate, no erythema and lips, buccal mucosa, and tongue normal  NECK: supple, thyroid normal size, non-tender, without nodularity LYMPH:  no palpable lymphadenopathy in the cervical, axillary or inguinal LUNGS: clear to auscultation and percussion with normal breathing effort HEART: regular rate & rhythm and no murmurs and no lower extremity edema ABDOMEN:abdomen soft, non-tender and normal bowel sounds MUSCULOSKELETAL:no cyanosis of digits and no clubbing  NEURO: alert & oriented x 3 with fluent speech, no focal motor/sensory deficits EXTREMITIES: No lower extremity edema   LABORATORY DATA:  I have reviewed the data as listed CMP Latest Ref Rng & Units 08/16/2018 08/09/2018 08/03/2018  Glucose 70 - 99 mg/dL 156(H) 94 140(H)  BUN 6 - 20 mg/dL '10 7 10  '$ Creatinine 0.44 - 1.00 mg/dL 0.84 0.72 0.63  Sodium 135 - 145 mmol/L 143 143 141  Potassium 3.5 - 5.1 mmol/L 3.6 3.7 3.7  Chloride 98 - 111 mmol/L 107 110 108  CO2 22 - 32 mmol/L '28 26 22  '$ Calcium 8.9 - 10.3 mg/dL 8.8(L) 9.0 8.8(L)  Total Protein 6.5 - 8.1 g/dL 6.8 6.9 7.2    Total Bilirubin 0.3 - 1.2 mg/dL <0.2(L) <0.2(L) 0.4  Alkaline Phos 38 - 126 U/L 131(H) 70 71  AST 15 - 41 U/L 51(H) 17 18  ALT 0 - 44 U/L 82(H) 23 32    Lab Results  Component Value Date   WBC 10.2 08/30/2018   HGB 10.7 (L) 08/30/2018   HCT 31.5 (L) 08/30/2018   MCV 88.2 08/30/2018   PLT 237 08/30/2018   NEUTROABS 5.9 08/30/2018    ASSESSMENT & PLAN:  Malignant neoplasm of upper-outer quadrant of right breast in female, estrogen receptor negative ([facility]) 06/23/2018:Palpable lump in the right breast for 2 months UOQ by mammogram and ultrasound cystic and solid lesion at 10 o'clock position measuring 4.1 cm, axilla negative, 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 Neoadjuvant chemotherapy with Adriamycin and Cytoxan dose dense 4 followed byTaxol and carboplatinweekly 12 2. Followed by breast conserving surgery with sentinel lymph node study  3. Followed by adjuvant radiation therapy ------------------------------------------------------------------------------ Current treatment:Cycle 4 dense Adriamycin and Cytoxan Labs have been reviewed  Chemo toxicities: Elevated LFTs: We will continue with the  treatment and will monitor it closely.  echocardiogram 07/09/2018: EF 55 to 60% Return to clinic in2 weeks for cycle 1 Taxol carboplatin    No orders of the defined types were placed in this encounter.  The patient has a good understanding of the overall plan. she agrees with it. she will call with any problems that may develop before the next visit here.   Harriette Ohara, MD 08/30/18

## 2018-09-01 ENCOUNTER — Inpatient Hospital Stay: Payer: 59 | Attending: Genetic Counselor

## 2018-09-01 DIAGNOSIS — C50411 Malignant neoplasm of upper-outer quadrant of right female breast: Secondary | ICD-10-CM | POA: Diagnosis present

## 2018-09-01 DIAGNOSIS — Z95828 Presence of other vascular implants and grafts: Secondary | ICD-10-CM

## 2018-09-01 DIAGNOSIS — Z5111 Encounter for antineoplastic chemotherapy: Secondary | ICD-10-CM | POA: Diagnosis not present

## 2018-09-01 DIAGNOSIS — Z171 Estrogen receptor negative status [ER-]: Secondary | ICD-10-CM

## 2018-09-01 DIAGNOSIS — Z7689 Persons encountering health services in other specified circumstances: Secondary | ICD-10-CM | POA: Insufficient documentation

## 2018-09-01 DIAGNOSIS — Z79899 Other long term (current) drug therapy: Secondary | ICD-10-CM | POA: Insufficient documentation

## 2018-09-01 DIAGNOSIS — R7989 Other specified abnormal findings of blood chemistry: Secondary | ICD-10-CM | POA: Diagnosis not present

## 2018-09-01 MED ORDER — PEGFILGRASTIM-CBQV 6 MG/0.6ML ~~LOC~~ SOSY
6.0000 mg | PREFILLED_SYRINGE | Freq: Once | SUBCUTANEOUS | Status: AC
  Administered 2018-09-01: 6 mg via SUBCUTANEOUS

## 2018-09-01 MED ORDER — PEGFILGRASTIM-CBQV 6 MG/0.6ML ~~LOC~~ SOSY
PREFILLED_SYRINGE | SUBCUTANEOUS | Status: AC
  Filled 2018-09-01: qty 0.6

## 2018-09-01 NOTE — Patient Instructions (Signed)
Tbo-Filgrastim injection What is this medicine? TBO-FILGRASTIM (T B O fil GRA stim) is a granulocyte colony-stimulating factor that stimulates the growth of neutrophils, a type of white blood cell important in the body's fight against infection. It is used to reduce the incidence of fever and infection in patients with certain types of cancer who are receiving chemotherapy that affects the bone marrow. This medicine may be used for other purposes; ask your health care provider or pharmacist if you have questions. COMMON BRAND NAME(S): Granix What should I tell my health care provider before I take this medicine? They need to know if you have any of these conditions: -bone scan or tests planned -kidney disease -sickle cell anemia -an unusual or allergic reaction to tbo-filgrastim, filgrastim, pegfilgrastim, other medicines, foods, dyes, or preservatives -pregnant or trying to get pregnant -breast-feeding How should I use this medicine? This medicine is for injection under the skin. If you get this medicine at home, you will be taught how to prepare and give this medicine. Refer to the Instructions for Use that come with your medication packaging. Use exactly as directed. Take your medicine at regular intervals. Do not take your medicine more often than directed. It is important that you put your used needles and syringes in a special sharps container. Do not put them in a trash can. If you do not have a sharps container, call your pharmacist or healthcare provider to get one. Talk to your pediatrician regarding the use of this medicine in children. Special care may be needed. Overdosage: If you think you have taken too much of this medicine contact a poison control center or emergency room at once. NOTE: This medicine is only for you. Do not share this medicine with others. What if I miss a dose? It is important not to miss your dose. Call your doctor or health care professional if you miss a  dose. What may interact with this medicine? This medicine may interact with the following medications: -medicines that may cause a release of neutrophils, such as lithium This list may not describe all possible interactions. Give your health care provider a list of all the medicines, herbs, non-prescription drugs, or dietary supplements you use. Also tell them if you smoke, drink alcohol, or use illegal drugs. Some items may interact with your medicine. What should I watch for while using this medicine? You may need blood work done while you are taking this medicine. What side effects may I notice from receiving this medicine? Side effects that you should report to your doctor or health care professional as soon as possible: -allergic reactions like skin rash, itching or hives, swelling of the face, lips, or tongue -blood in the urine -dark urine -dizziness -fast heartbeat -feeling faint -shortness of breath or breathing problems -signs and symptoms of infection like fever or chills; cough; or sore throat -signs and symptoms of kidney injury like trouble passing urine or change in the amount of urine -stomach or side pain, or pain at the shoulder -sweating -swelling of the legs, ankles, or abdomen -tiredness Side effects that usually do not require medical attention (report to your doctor or health care professional if they continue or are bothersome): -bone pain -headache -muscle pain -vomiting This list may not describe all possible side effects. Call your doctor for medical advice about side effects. You may report side effects to FDA at 1-800-FDA-1088. Where should I keep my medicine? Keep out of the reach of children. Store in a refrigerator between   2 and 8 degrees C (36 and 46 degrees F). Keep in carton to protect from light. Throw away this medicine if it is left out of the refrigerator for more than 5 consecutive days. Throw away any unused medicine after the expiration  date. NOTE: This sheet is a summary. It may not cover all possible information. If you have questions about this medicine, talk to your doctor, pharmacist, or health care provider.  2018 Elsevier/Gold Standard (2016-01-07 19:07:04)  

## 2018-09-13 ENCOUNTER — Inpatient Hospital Stay: Payer: 59

## 2018-09-13 ENCOUNTER — Inpatient Hospital Stay (HOSPITAL_BASED_OUTPATIENT_CLINIC_OR_DEPARTMENT_OTHER): Payer: 59 | Admitting: Hematology and Oncology

## 2018-09-13 ENCOUNTER — Encounter: Payer: Self-pay | Admitting: *Deleted

## 2018-09-13 VITALS — BP 122/86 | HR 79 | Temp 97.9°F | Resp 18

## 2018-09-13 DIAGNOSIS — R7989 Other specified abnormal findings of blood chemistry: Secondary | ICD-10-CM | POA: Diagnosis not present

## 2018-09-13 DIAGNOSIS — C50411 Malignant neoplasm of upper-outer quadrant of right female breast: Secondary | ICD-10-CM

## 2018-09-13 DIAGNOSIS — Z171 Estrogen receptor negative status [ER-]: Secondary | ICD-10-CM | POA: Diagnosis not present

## 2018-09-13 DIAGNOSIS — Z95828 Presence of other vascular implants and grafts: Secondary | ICD-10-CM

## 2018-09-13 DIAGNOSIS — Z79899 Other long term (current) drug therapy: Secondary | ICD-10-CM

## 2018-09-13 LAB — CBC WITH DIFFERENTIAL (CANCER CENTER ONLY)
Abs Immature Granulocytes: 1.57 10*3/uL — ABNORMAL HIGH (ref 0.00–0.07)
BASOS PCT: 1 %
Basophils Absolute: 0.1 10*3/uL (ref 0.0–0.1)
EOS ABS: 0 10*3/uL (ref 0.0–0.5)
Eosinophils Relative: 0 %
HCT: 30.3 % — ABNORMAL LOW (ref 36.0–46.0)
Hemoglobin: 10.2 g/dL — ABNORMAL LOW (ref 12.0–15.0)
IMMATURE GRANULOCYTES: 14 %
LYMPHS ABS: 2.2 10*3/uL (ref 0.7–4.0)
Lymphocytes Relative: 20 %
MCH: 30 pg (ref 26.0–34.0)
MCHC: 33.7 g/dL (ref 30.0–36.0)
MCV: 89.1 fL (ref 80.0–100.0)
Monocytes Absolute: 1.4 10*3/uL — ABNORMAL HIGH (ref 0.1–1.0)
Monocytes Relative: 12 %
NEUTROS PCT: 53 %
NRBC: 0.9 % — AB (ref 0.0–0.2)
Neutro Abs: 5.7 10*3/uL (ref 1.7–7.7)
PLATELETS: 167 10*3/uL (ref 150–400)
RBC: 3.4 MIL/uL — AB (ref 3.87–5.11)
RDW: 19.5 % — AB (ref 11.5–15.5)
WBC Count: 11 10*3/uL — ABNORMAL HIGH (ref 4.0–10.5)

## 2018-09-13 LAB — COMPREHENSIVE METABOLIC PANEL
ALBUMIN: 3.7 g/dL (ref 3.5–5.0)
ALT: 18 U/L (ref 0–44)
AST: 20 U/L (ref 15–41)
Alkaline Phosphatase: 79 U/L (ref 38–126)
Anion gap: 7 (ref 5–15)
BILIRUBIN TOTAL: 0.4 mg/dL (ref 0.3–1.2)
BUN: 8 mg/dL (ref 6–20)
CO2: 25 mmol/L (ref 22–32)
Calcium: 8.8 mg/dL — ABNORMAL LOW (ref 8.9–10.3)
Chloride: 111 mmol/L (ref 98–111)
Creatinine, Ser: 0.62 mg/dL (ref 0.44–1.00)
GFR calc Af Amer: >60 mL/min (ref >60)
GLUCOSE: 150 mg/dL — AB (ref 70–99)
POTASSIUM: 3.2 mmol/L — AB (ref 3.5–5.1)
Sodium: 143 mmol/L (ref 135–145)
TOTAL PROTEIN: 6.6 g/dL (ref 6.5–8.1)

## 2018-09-13 MED ORDER — SODIUM CHLORIDE 0.9 % IV SOLN
80.0000 mg/m2 | Freq: Once | INTRAVENOUS | Status: AC
  Administered 2018-09-13: 168 mg via INTRAVENOUS
  Filled 2018-09-13: qty 28

## 2018-09-13 MED ORDER — FAMOTIDINE IN NACL 20-0.9 MG/50ML-% IV SOLN
INTRAVENOUS | Status: AC
  Filled 2018-09-13: qty 50

## 2018-09-13 MED ORDER — PALONOSETRON HCL INJECTION 0.25 MG/5ML
0.2500 mg | Freq: Once | INTRAVENOUS | Status: AC
  Administered 2018-09-13: 0.25 mg via INTRAVENOUS

## 2018-09-13 MED ORDER — HEPARIN SOD (PORK) LOCK FLUSH 100 UNIT/ML IV SOLN
500.0000 [IU] | Freq: Once | INTRAVENOUS | Status: AC | PRN
  Administered 2018-09-13: 500 [IU]
  Filled 2018-09-13: qty 5

## 2018-09-13 MED ORDER — PALONOSETRON HCL INJECTION 0.25 MG/5ML
INTRAVENOUS | Status: AC
  Filled 2018-09-13: qty 5

## 2018-09-13 MED ORDER — SODIUM CHLORIDE 0.9 % IV SOLN
Freq: Once | INTRAVENOUS | Status: AC
  Administered 2018-09-13: 10:00:00 via INTRAVENOUS
  Filled 2018-09-13: qty 250

## 2018-09-13 MED ORDER — SODIUM CHLORIDE 0.9% FLUSH
10.0000 mL | INTRAVENOUS | Status: DC | PRN
  Administered 2018-09-13: 10 mL
  Filled 2018-09-13: qty 10

## 2018-09-13 MED ORDER — SODIUM CHLORIDE 0.9 % IV SOLN
700.0000 mg | Freq: Once | INTRAVENOUS | Status: AC
  Administered 2018-09-13: 700 mg via INTRAVENOUS
  Filled 2018-09-13: qty 70

## 2018-09-13 MED ORDER — DIPHENHYDRAMINE HCL 50 MG/ML IJ SOLN
INTRAMUSCULAR | Status: AC
  Filled 2018-09-13: qty 1

## 2018-09-13 MED ORDER — FAMOTIDINE IN NACL 20-0.9 MG/50ML-% IV SOLN
20.0000 mg | Freq: Once | INTRAVENOUS | Status: AC
  Administered 2018-09-13: 20 mg via INTRAVENOUS

## 2018-09-13 MED ORDER — SODIUM CHLORIDE 0.9 % IV SOLN
Freq: Once | INTRAVENOUS | Status: AC
  Administered 2018-09-13: 11:00:00 via INTRAVENOUS
  Filled 2018-09-13: qty 5

## 2018-09-13 MED ORDER — DIPHENHYDRAMINE HCL 50 MG/ML IJ SOLN
25.0000 mg | Freq: Once | INTRAMUSCULAR | Status: AC
  Administered 2018-09-13: 25 mg via INTRAVENOUS

## 2018-09-13 NOTE — Assessment & Plan Note (Signed)
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 Neoadjuvant chemotherapy with Adriamycin and Cytoxan dose dense 4 followed byTaxol and carboplatinweekly 12 2. Followed by breast conserving surgery with sentinel lymph node study  3. Followed by adjuvant radiation therapy ------------------------------------------------------------------------------ Current treatment: Completed 4 cycles of dense Adriamycin and Cytoxan, today is cycle 1 Taxol [employer] have been reviewed  Chemo toxicities: Elevated LFTs: We will continue with the treatment and will monitor it closely. Chemotherapy-induced anemia: Monitoring closely  echocardiogram 07/09/2018: EF 55 to 60% Return to clinic in 1 week for cycle 2 Taxol and for toxicity evaluation

## 2018-09-13 NOTE — Progress Notes (Signed)
Patient Care Team: Tysinger, Camelia Eng, PA-C as PCP - General (Family Medicine) Excell Seltzer, MD as Consulting Physician (General Surgery) Nicholas Lose, MD as Consulting Physician (Hematology and Oncology) Gery Pray, MD as Consulting Physician (Radiation Oncology)  DIAGNOSIS:  Encounter Diagnosis  Name Primary?  . 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 The Common Hereditary Cancers Panel offered by [facility] includes sequencing and/or deletion duplication testing of the following 47 genes: APC, ATM, AXIN2, BARD1, BMPR1A, BRCA1, BRCA2, BRIP1, CDH1, CDKN2A (p14ARF), CDKN2A (p16INK4a), CKD4, CHEK2, CTNNA1, DICER1, EPCAM (Deletion/duplication testing only), GREM1 (promoter region deletion/duplication testing only), KIT, MEN1, MLH1, MSH2, MSH3, MSH6, MUTYH, NBN, NF1, NHTL1, PALB2, PDGFRA, PMS2, POLD1, POLE, PTEN, RAD50, RAD51C, RAD51D, SDHB, SDHC, SDHD, SMAD4, SMARCA4. STK11, TP53, TSC1, TSC2, and VHL.  The following genes were evaluated for sequence changes only: SDHA and HOXB13 c.251G>A variant only.  The report date is 07/23/2018.     CHIEF COMPLIANT: Cycle 1 Taxol and carbo  INTERVAL HISTORY: Tara Rodriguez is a 47 year old with above-mentioned history of right breast cancer currently neoadjuvant  chemotherapy and today is cycle 1 of Taxol and carboplatin.  She will receive carboplatin every 3 weeks.  She will receive Taxol weekly.  She denies any nausea or vomiting.  She is very excited that she does not have to receive Neulasta because of the bone pain.  REVIEW OF SYSTEMS:   Constitutional: Denies fevers, chills or abnormal weight loss Eyes: Denies blurriness of vision Ears, nose, mouth, throat, and face: Denies mucositis or sore throat Respiratory: Denies cough, dyspnea or wheezes Cardiovascular: Denies palpitation, chest discomfort Gastrointestinal:  Denies nausea, heartburn or change in bowel habits Skin: Denies abnormal skin rashes Lymphatics: Denies new lymphadenopathy or easy bruising Neurological:Denies numbness, tingling or new weaknesses Behavioral/Psych: Mood is stable, no new changes  Extremities: No lower extremity edema   All other systems were reviewed with the patient and are negative.  I have reviewed the past medical history, past surgical history, social history and family history with the patient and they are unchanged from previous note.  ALLERGIES:  has No Known Allergies.  MEDICATIONS:  Current Outpatient Medications  Medication Sig Dispense Refill  . acetaminophen (TYLENOL) 500 MG tablet Take 500 mg by mouth every 6 (six) hours as needed for mild pain.     Marland Kitchen lidocaine-prilocaine (EMLA) cream Apply to affected area once 30 g 3  . LORazepam (ATIVAN) 0.5 MG tablet Take 1 tablet (0.5 mg total) by mouth at bedtime as needed for sleep. 30 tablet 0  . naproxen sodium (ALEVE) 220 MG tablet Take 220 mg by mouth as needed (pain).     . ondansetron (ZOFRAN) 8 MG tablet Take 1 tablet (8 mg total) by mouth 2 (two) times daily as needed.  Start on the third day after chemotherapy. 30 tablet 1  . prochlorperazine (COMPAZINE) 10 MG tablet Take 1 tablet (10 mg total) by mouth every 6 (six) hours as needed (Nausea or vomiting). 30 tablet 1  . VITAMIN E PO Take by mouth  daily.      No current facility-administered medications for this visit.     PHYSICAL EXAMINATION: ECOG PERFORMANCE STATUS: 1 - Symptomatic but completely ambulatory  Vitals:   09/13/18 0853  BP: 137/87  Pulse: 85  Resp: 19  Temp: 98.6 F (37 C)  SpO2: 100%   Filed Weights   09/13/18 0853  Weight: 206 lb 6.4 oz (93.6 kg)    GENERAL:alert, no distress and comfortable SKIN: skin color, texture, turgor are normal, no rashes or significant lesions EYES: normal, Conjunctiva are pink and non-injected, sclera clear OROPHARYNX:no exudate, no erythema and lips, buccal mucosa, and tongue normal  NECK: supple, thyroid normal size, non-tender, without nodularity LYMPH:  no palpable lymphadenopathy in the cervical, axillary or inguinal LUNGS: clear to auscultation and percussion with normal breathing effort HEART: regular rate & rhythm and no murmurs and no lower extremity edema ABDOMEN:abdomen soft, non-tender and normal bowel sounds MUSCULOSKELETAL:no cyanosis of digits and no clubbing  NEURO: alert & oriented x 3 with fluent speech, no focal motor/sensory deficits EXTREMITIES: No lower extremity edema   LABORATORY DATA:  I have reviewed the data as listed CMP Latest Ref Rng & Units 08/30/2018 08/16/2018 08/09/2018  Glucose 70 - 99 mg/dL 106(H) 156(H) 94  BUN 6 - 20 mg/dL _0 Creatinine 0.44 - 1.00 mg/dL 0.77 0.84 0.72  Sodium 135 - 145 mmol/L 143 143 143  Potassium 3.5 - 5.1 mmol/L 3.5 3.6 3.7  Chloride 98 - 111 mmol/L 110 107 110  CO2 22 - 32 mmol/L _1 Calcium 8.9 - 10.3 mg/dL 8.7(L) 8.8(L) 9.0  Total Protein 6.5 - 8.1 g/dL 6.8 6.8 6.9  Total Bilirubin 0.3 - 1.2 mg/dL <0.2(L) <0.2(L) <0.2(L)  Alkaline Phos 38 - 126 U/L 94 131(H) 70  AST 15 - 41 U/L 16 51(H) 17  ALT 0 - 44 U/L 21 82(H) 23    Lab Results  Component Value Date   WBC 11.0 (H) 09/13/2018   HGB 10.2 (L) 09/13/2018   HCT 30.3 (L) 09/13/2018   MCV 89.1 09/13/2018   PLT 167 09/13/2018   NEUTROABS  PENDING 09/13/2018    ASSESSMENT & PLAN:  Malignant neoplasm of upper-outer quadrant of right breast in female, estrogen receptor negative ([facility]) 06/23/2018:Palpable lump in the right breast for 2 months UOQ by mammogram and ultrasound cystic and solid lesion at 10 o'clock position measuring 4.1 cm, axilla negative, 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 Neoadjuvant chemotherapy with Adriamycin and Cytoxan dose dense 4 followed byTaxol and carboplatinweekly 12 2. Followed by breast conserving surgery with sentinel lymph node study  3. Followed by adjuvant radiation therapy ------------------------------------------------------------------------------ Current treatment: Completed 4 cycles of dense Adriamycin and Cytoxan, today is cycle 1 Taxol [employer] have been reviewed  Chemo toxicities: Elevated LFTs: We will continue with the treatment and will monitor it closely. Chemotherapy-induced anemia: Monitoring closely  echocardiogram 07/09/2018: EF 55 to 60% Return to clinic in 1 week for cycle 2 Taxol and for toxicity evaluation    Orders Placed This Encounter  Procedures  . Comprehensive metabolic panel   The patient has  a good understanding of the overall plan. she agrees with it. she will call with any problems that may develop before the next visit here.   Harriette Ohara, MD 09/13/18

## 2018-09-13 NOTE — Patient Instructions (Signed)
[facility] Discharge Instructions for Patients Receiving Chemotherapy  Today you received the following chemotherapy agents paclitaxel (Taxol) and carboplatin (Paraplatin).  To help prevent nausea and vomiting after your treatment, we encourage you to take your nausea medication as directed by your physician.    If you develop nausea and vomiting that is not controlled by your nausea medication, call the clinic.   BELOW ARE SYMPTOMS THAT SHOULD BE REPORTED IMMEDIATELY:  *FEVER GREATER THAN 100.5 F  *CHILLS WITH OR WITHOUT FEVER  NAUSEA AND VOMITING THAT IS NOT CONTROLLED WITH YOUR NAUSEA MEDICATION  *UNUSUAL SHORTNESS OF BREATH  *UNUSUAL BRUISING OR BLEEDING  TENDERNESS IN MOUTH AND THROAT WITH OR WITHOUT PRESENCE OF ULCERS  *URINARY PROBLEMS  *BOWEL PROBLEMS  UNUSUAL RASH Items with * indicate a potential emergency and should be followed up as soon as possible.  Feel free to call the clinic should you have any questions or concerns. The clinic phone number is [phone].  Please show the CHEMO ALERT CARD at check-in to the Emergency Department and triage nurse.  Paclitaxel injection What is this medicine? PACLITAXEL (PAK li TAX el) is a chemotherapy drug. It targets fast dividing cells, like cancer cells, and causes these cells to die. This medicine is used to treat ovarian cancer, breast cancer, and other cancers. This medicine may be used for other purposes; ask your health care provider or pharmacist if you have questions. COMMON BRAND NAME(S): Onxol, Taxol What should I tell my health care provider before I take this medicine? They need to know if you have any of these conditions: -blood disorders -irregular heartbeat -infection (especially a virus infection such as chickenpox, cold sores, or herpes) -liver disease -previous or ongoing radiation therapy -an unusual or allergic reaction to paclitaxel, alcohol, polyoxyethylated castor oil, other  chemotherapy agents, other medicines, foods, dyes, or preservatives -pregnant or trying to get pregnant -breast-feeding How should I use this medicine? This drug is given as an infusion into a vein. It is administered in a hospital or clinic by a specially trained health care professional. Talk to your pediatrician regarding the use of this medicine in children. Special care may be needed. Overdosage: If you think you have taken too much of this medicine contact a poison control center or emergency room at once. NOTE: This medicine is only for you. Do not share this medicine with others. What if I miss a dose? It is important not to miss your dose. Call your doctor or health care professional if you are unable to keep an appointment. What may interact with this medicine? Do not take this medicine with any of the following medications: -disulfiram -metronidazole This medicine may also interact with the following medications: -cyclosporine -diazepam -ketoconazole -medicines to increase blood counts like filgrastim, pegfilgrastim, sargramostim -other chemotherapy drugs like cisplatin, doxorubicin, epirubicin, etoposide, teniposide, vincristine -quinidine -testosterone -vaccines -verapamil Talk to your doctor or health care professional before taking any of these medicines: -acetaminophen -aspirin -ibuprofen -ketoprofen -naproxen This list may not describe all possible interactions. Give your health care provider a list of all the medicines, herbs, non-prescription drugs, or dietary supplements you use. Also tell them if you smoke, drink alcohol, or use illegal drugs. Some items may interact with your medicine. What should I watch for while using this medicine? Your condition will be monitored carefully while you are receiving this medicine. You will need important blood work done while you are taking this medicine. This medicine can cause serious allergic reactions. To   reduce your risk  you will need to take other medicine(s) before treatment with this medicine. If you experience allergic reactions like skin rash, itching or hives, swelling of the face, lips, or tongue, tell your doctor or health care professional right away. In some cases, you may be given additional medicines to help with side effects. Follow all directions for their use. This drug may make you feel generally unwell. This is not uncommon, as chemotherapy can affect healthy cells as well as cancer cells. Report any side effects. Continue your course of treatment even though you feel ill unless your doctor tells you to stop. Call your doctor or health care professional for advice if you get a fever, chills or sore throat, or other symptoms of a cold or flu. Do not treat yourself. This drug decreases your body's ability to fight infections. Try to avoid being around people who are sick. This medicine may increase your risk to bruise or bleed. Call your doctor or health care professional if you notice any unusual bleeding. Be careful brushing and flossing your teeth or using a toothpick because you may get an infection or bleed more easily. If you have any dental work done, tell your dentist you are receiving this medicine. Avoid taking products that contain aspirin, acetaminophen, ibuprofen, naproxen, or ketoprofen unless instructed by your doctor. These medicines may hide a fever. Do not become pregnant while taking this medicine. Women should inform their doctor if they wish to become pregnant or think they might be pregnant. There is a potential for serious side effects to an unborn child. Talk to your health care professional or pharmacist for more information. Do not breast-feed an infant while taking this medicine. Men are advised not to father a child while receiving this medicine. This product may contain alcohol. Ask your pharmacist or healthcare provider if this medicine contains alcohol. Be sure to tell all  healthcare providers you are taking this medicine. Certain medicines, like metronidazole and disulfiram, can cause an unpleasant reaction when taken with alcohol. The reaction includes flushing, headache, nausea, vomiting, sweating, and increased thirst. The reaction can last from 30 minutes to several hours. What side effects may I notice from receiving this medicine? Side effects that you should report to your doctor or health care professional as soon as possible: -allergic reactions like skin rash, itching or hives, swelling of the face, lips, or tongue -low blood counts - This drug may decrease the number of white blood cells, red blood cells and platelets. You may be at increased risk for infections and bleeding. -signs of infection - fever or chills, cough, sore throat, pain or difficulty passing urine -signs of decreased platelets or bleeding - bruising, pinpoint red spots on the skin, black, tarry stools, nosebleeds -signs of decreased red blood cells - unusually weak or tired, fainting spells, lightheadedness -breathing problems -chest pain -high or low blood pressure -mouth sores -nausea and vomiting -pain, swelling, redness or irritation at the injection site -pain, tingling, numbness in the hands or feet -slow or irregular heartbeat -swelling of the ankle, feet, hands Side effects that usually do not require medical attention (report to your doctor or health care professional if they continue or are bothersome): -bone pain -complete hair loss including hair on your head, underarms, pubic hair, eyebrows, and eyelashes -changes in the color of fingernails -diarrhea -loosening of the fingernails -loss of appetite -muscle or joint pain -red flush to skin -sweating This list may not describe all possible side effects.   Call your doctor for medical advice about side effects. You may report side effects to FDA at 1-800-FDA-1088. Where should I keep my medicine? This drug is given in  a hospital or clinic and will not be stored at home. NOTE: This sheet is a summary. It may not cover all possible information. If you have questions about this medicine, talk to your doctor, pharmacist, or health care provider.  2018 Elsevier/Gold Standard (2015-09-18 19:58:00)   Carboplatin injection What is this medicine? CARBOPLATIN (KAR boe pla tin) is a chemotherapy drug. It targets fast dividing cells, like cancer cells, and causes these cells to die. This medicine is used to treat ovarian cancer and many other cancers. This medicine may be used for other purposes; ask your health care provider or pharmacist if you have questions. COMMON BRAND NAME(S): Paraplatin What should I tell my health care provider before I take this medicine? They need to know if you have any of these conditions: -blood disorders -hearing problems -kidney disease -recent or ongoing radiation therapy -an unusual or allergic reaction to carboplatin, cisplatin, other chemotherapy, other medicines, foods, dyes, or preservatives -pregnant or trying to get pregnant -breast-feeding How should I use this medicine? This drug is usually given as an infusion into a vein. It is administered in a hospital or clinic by a specially trained health care professional. Talk to your pediatrician regarding the use of this medicine in children. Special care may be needed. Overdosage: If you think you have taken too much of this medicine contact a poison control center or emergency room at once. NOTE: This medicine is only for you. Do not share this medicine with others. What if I miss a dose? It is important not to miss a dose. Call your doctor or health care professional if you are unable to keep an appointment. What may interact with this medicine? -medicines for seizures -medicines to increase blood counts like filgrastim, pegfilgrastim, sargramostim -some antibiotics like amikacin, gentamicin, neomycin, streptomycin,  tobramycin -vaccines Talk to your doctor or health care professional before taking any of these medicines: -acetaminophen -aspirin -ibuprofen -ketoprofen -naproxen This list may not describe all possible interactions. Give your health care provider a list of all the medicines, herbs, non-prescription drugs, or dietary supplements you use. Also tell them if you smoke, drink alcohol, or use illegal drugs. Some items may interact with your medicine. What should I watch for while using this medicine? Your condition will be monitored carefully while you are receiving this medicine. You will need important blood work done while you are taking this medicine. This drug may make you feel generally unwell. This is not uncommon, as chemotherapy can affect healthy cells as well as cancer cells. Report any side effects. Continue your course of treatment even though you feel ill unless your doctor tells you to stop. In some cases, you may be given additional medicines to help with side effects. Follow all directions for their use. Call your doctor or health care professional for advice if you get a fever, chills or sore throat, or other symptoms of a cold or flu. Do not treat yourself. This drug decreases your body's ability to fight infections. Try to avoid being around people who are sick. This medicine may increase your risk to bruise or bleed. Call your doctor or health care professional if you notice any unusual bleeding. Be careful brushing and flossing your teeth or using a toothpick because you may get an infection or bleed more easily. If you have   any dental work done, tell your dentist you are receiving this medicine. Avoid taking products that contain aspirin, acetaminophen, ibuprofen, naproxen, or ketoprofen unless instructed by your doctor. These medicines may hide a fever. Do not become pregnant while taking this medicine. Women should inform their doctor if they wish to become pregnant or think  they might be pregnant. There is a potential for serious side effects to an unborn child. Talk to your health care professional or pharmacist for more information. Do not breast-feed an infant while taking this medicine. What side effects may I notice from receiving this medicine? Side effects that you should report to your doctor or health care professional as soon as possible: -allergic reactions like skin rash, itching or hives, swelling of the face, lips, or tongue -signs of infection - fever or chills, cough, sore throat, pain or difficulty passing urine -signs of decreased platelets or bleeding - bruising, pinpoint red spots on the skin, black, tarry stools, nosebleeds -signs of decreased red blood cells - unusually weak or tired, fainting spells, lightheadedness -breathing problems -changes in hearing -changes in vision -chest pain -high blood pressure -low blood counts - This drug may decrease the number of white blood cells, red blood cells and platelets. You may be at increased risk for infections and bleeding. -nausea and vomiting -pain, swelling, redness or irritation at the injection site -pain, tingling, numbness in the hands or feet -problems with balance, talking, walking -trouble passing urine or change in the amount of urine Side effects that usually do not require medical attention (report to your doctor or health care professional if they continue or are bothersome): -hair loss -loss of appetite -metallic taste in the mouth or changes in taste This list may not describe all possible side effects. Call your doctor for medical advice about side effects. You may report side effects to FDA at 1-800-FDA-1088. Where should I keep my medicine? This drug is given in a hospital or clinic and will not be stored at home. NOTE: This sheet is a summary. It may not cover all possible information. If you have questions about this medicine, talk to your doctor, pharmacist, or health care  provider.  2018 Elsevier/Gold Standard (2008-02-22 14:38:05)      

## 2018-09-20 ENCOUNTER — Telehealth: Payer: Self-pay | Admitting: Hematology and Oncology

## 2018-09-20 ENCOUNTER — Inpatient Hospital Stay (HOSPITAL_BASED_OUTPATIENT_CLINIC_OR_DEPARTMENT_OTHER): Payer: 59 | Admitting: Hematology and Oncology

## 2018-09-20 ENCOUNTER — Inpatient Hospital Stay: Payer: 59

## 2018-09-20 DIAGNOSIS — C50411 Malignant neoplasm of upper-outer quadrant of right female breast: Secondary | ICD-10-CM

## 2018-09-20 DIAGNOSIS — Z171 Estrogen receptor negative status [ER-]: Secondary | ICD-10-CM

## 2018-09-20 DIAGNOSIS — R7989 Other specified abnormal findings of blood chemistry: Secondary | ICD-10-CM | POA: Diagnosis not present

## 2018-09-20 DIAGNOSIS — Z95828 Presence of other vascular implants and grafts: Secondary | ICD-10-CM

## 2018-09-20 DIAGNOSIS — Z79899 Other long term (current) drug therapy: Secondary | ICD-10-CM | POA: Diagnosis not present

## 2018-09-20 LAB — CBC WITH DIFFERENTIAL (CANCER CENTER ONLY)
Abs Immature Granulocytes: 0.03 10*3/uL (ref 0.00–0.07)
BASOS PCT: 2 %
Basophils Absolute: 0.1 10*3/uL (ref 0.0–0.1)
EOS ABS: 0 10*3/uL (ref 0.0–0.5)
Eosinophils Relative: 0 %
HCT: 27.3 % — ABNORMAL LOW (ref 36.0–46.0)
Hemoglobin: 9.3 g/dL — ABNORMAL LOW (ref 12.0–15.0)
IMMATURE GRANULOCYTES: 1 %
Lymphocytes Relative: 30 %
Lymphs Abs: 1.5 10*3/uL (ref 0.7–4.0)
MCH: 30.6 pg (ref 26.0–34.0)
MCHC: 34.1 g/dL (ref 30.0–36.0)
MCV: 89.8 fL (ref 80.0–100.0)
MONO ABS: 0.6 10*3/uL (ref 0.1–1.0)
Monocytes Relative: 12 %
NEUTROS ABS: 2.7 10*3/uL (ref 1.7–7.7)
NEUTROS PCT: 55 %
PLATELETS: 294 10*3/uL (ref 150–400)
RBC: 3.04 MIL/uL — AB (ref 3.87–5.11)
RDW: 19.2 % — AB (ref 11.5–15.5)
WBC: 4.8 10*3/uL (ref 4.0–10.5)
nRBC: 0 % (ref 0.0–0.2)

## 2018-09-20 LAB — CMP (CANCER CENTER ONLY)
ALBUMIN: 3.5 g/dL (ref 3.5–5.0)
ALK PHOS: 69 U/L (ref 38–126)
ALT: 19 U/L (ref 0–44)
ANION GAP: 7 (ref 5–15)
AST: 18 U/L (ref 15–41)
BUN: 9 mg/dL (ref 6–20)
CALCIUM: 8.7 mg/dL — AB (ref 8.9–10.3)
CO2: 24 mmol/L (ref 22–32)
Chloride: 110 mmol/L (ref 98–111)
Creatinine: 0.7 mg/dL (ref 0.44–1.00)
GLUCOSE: 120 mg/dL — AB (ref 70–99)
POTASSIUM: 3.7 mmol/L (ref 3.5–5.1)
Sodium: 141 mmol/L (ref 135–145)
TOTAL PROTEIN: 6.5 g/dL (ref 6.5–8.1)
Total Bilirubin: <0.2 mg/dL — ABNORMAL LOW (ref 0.3–1.2)

## 2018-09-20 MED ORDER — DIPHENHYDRAMINE HCL 50 MG/ML IJ SOLN
INTRAMUSCULAR | Status: AC
  Filled 2018-09-20: qty 1

## 2018-09-20 MED ORDER — DEXAMETHASONE SODIUM PHOSPHATE 10 MG/ML IJ SOLN
10.0000 mg | Freq: Once | INTRAMUSCULAR | Status: AC
  Administered 2018-09-20: 10 mg via INTRAVENOUS

## 2018-09-20 MED ORDER — SODIUM CHLORIDE 0.9 % IV SOLN
Freq: Once | INTRAVENOUS | Status: AC
  Administered 2018-09-20: 10:00:00 via INTRAVENOUS
  Filled 2018-09-20: qty 250

## 2018-09-20 MED ORDER — SODIUM CHLORIDE 0.9% FLUSH
10.0000 mL | INTRAVENOUS | Status: DC | PRN
  Administered 2018-09-20: 10 mL
  Filled 2018-09-20: qty 10

## 2018-09-20 MED ORDER — DEXAMETHASONE SODIUM PHOSPHATE 10 MG/ML IJ SOLN
INTRAMUSCULAR | Status: AC
  Filled 2018-09-20: qty 1

## 2018-09-20 MED ORDER — DIPHENHYDRAMINE HCL 50 MG/ML IJ SOLN
25.0000 mg | Freq: Once | INTRAMUSCULAR | Status: AC
  Administered 2018-09-20: 25 mg via INTRAVENOUS

## 2018-09-20 MED ORDER — FAMOTIDINE IN NACL 20-0.9 MG/50ML-% IV SOLN
INTRAVENOUS | Status: AC
  Filled 2018-09-20: qty 50

## 2018-09-20 MED ORDER — FAMOTIDINE IN NACL 20-0.9 MG/50ML-% IV SOLN
20.0000 mg | Freq: Once | INTRAVENOUS | Status: AC
  Administered 2018-09-20: 20 mg via INTRAVENOUS

## 2018-09-20 MED ORDER — SODIUM CHLORIDE 0.9 % IV SOLN
80.0000 mg/m2 | Freq: Once | INTRAVENOUS | Status: AC
  Administered 2018-09-20: 168 mg via INTRAVENOUS
  Filled 2018-09-20: qty 28

## 2018-09-20 MED ORDER — HEPARIN SOD (PORK) LOCK FLUSH 100 UNIT/ML IV SOLN
500.0000 [IU] | Freq: Once | INTRAVENOUS | Status: AC | PRN
  Administered 2018-09-20: 500 [IU]
  Filled 2018-09-20: qty 5

## 2018-09-20 MED ORDER — SODIUM CHLORIDE 0.9 % IV SOLN: 10.0000 mg | Freq: Once | INTRAVENOUS | Status: DC

## 2018-09-20 NOTE — Telephone Encounter (Signed)
Gave avs and calendar ° °

## 2018-09-20 NOTE — Progress Notes (Signed)
Patient Care Team: Tysinger, Camelia Eng, PA-C as PCP - General (Family Medicine) Excell Seltzer, MD as Consulting Physician (General Surgery) Nicholas Lose, MD as Consulting Physician (Hematology and Oncology) Gery Pray, MD as Consulting Physician (Radiation Oncology)  DIAGNOSIS:  Encounter Diagnosis  Name Primary?  . 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 The Common Hereditary Cancers Panel offered by [employer] includes sequencing and/or deletion duplication testing of the following 47 genes: APC, ATM, AXIN2, BARD1, BMPR1A, BRCA1, BRCA2, BRIP1, CDH1, CDKN2A (p14ARF), CDKN2A (p16INK4a), CKD4, CHEK2, CTNNA1, DICER1, EPCAM (Deletion/duplication testing only), GREM1 (promoter region deletion/duplication testing only), KIT, MEN1, MLH1, MSH2, MSH3, MSH6, MUTYH, NBN, NF1, NHTL1, PALB2, PDGFRA, PMS2, POLD1, POLE, PTEN, RAD50, RAD51C, RAD51D, SDHB, SDHC, SDHD, SMAD4, SMARCA4. STK11, TP53, TSC1, TSC2, and VHL.  The following genes were evaluated for sequence changes only: SDHA and HOXB13 c.251G>A variant only.  The report date is 07/23/2018.     CHIEF COMPLIANT: Cycle 2 Taxol  INTERVAL HISTORY: ADASIA HOAR is a 68-year with above-mentioned history of right breast cancer currently neoadjuvant chemotherapy  and today is cycle 2 of Taxol.  She is tolerated cycle 1 Taxol and carboplatin fairly well.  She did have bone pain that lasted for 1 to 2 days after chemo but it got better.  She did not know if she could take Tylenol.  Denies any nausea vomiting.  Does have fatigue from chemotherapy.  REVIEW OF SYSTEMS:   Constitutional: Denies fevers, chills or abnormal weight loss Eyes: Denies blurriness of vision Ears, nose, mouth, throat, and face: Denies mucositis or sore throat Respiratory: Denies cough, dyspnea or wheezes Cardiovascular: Denies palpitation, chest discomfort Gastrointestinal:  Denies nausea, heartburn or change in bowel habits Skin: Denies abnormal skin rashes Lymphatics: Denies new lymphadenopathy or easy bruising Neurological:Denies numbness, tingling or new weaknesses Behavioral/Psych: Mood is stable, no new changes  Extremities: No lower extremity edema   All other systems were reviewed with the patient and are negative.  I have reviewed the past medical history, past surgical history, social history and family history with the patient and they are unchanged from previous note.  ALLERGIES:  has No Known Allergies.  MEDICATIONS:  Current Outpatient Medications  Medication Sig Dispense Refill  . acetaminophen (TYLENOL) 500 MG tablet Take 500 mg by mouth every 6 (six) hours as needed for mild pain.     Marland Kitchen lidocaine-prilocaine (EMLA) cream Apply to affected area once 30 g 3  . LORazepam (ATIVAN) 0.5 MG tablet Take 1 tablet (0.5 mg total) by mouth at bedtime as needed for sleep. 30 tablet 0  . naproxen sodium (ALEVE) 220 MG tablet Take 220 mg by mouth as needed (pain).     . ondansetron (ZOFRAN) 8 MG tablet Take 1 tablet (8 mg total)  by mouth 2 (two) times daily as needed. Start on the third day after chemotherapy. 30 tablet 1  . prochlorperazine (COMPAZINE) 10 MG tablet Take 1 tablet (10 mg total) by mouth every 6 (six) hours as needed (Nausea or vomiting). 30 tablet 1  . VITAMIN  E PO Take by mouth daily.      No current facility-administered medications for this visit.     PHYSICAL EXAMINATION: ECOG PERFORMANCE STATUS: 1 - Symptomatic but completely ambulatory  Vitals:   09/20/18 0930  BP: 134/86  Pulse: 92  Resp: 17  Temp: 98.4 F (36.9 C)  SpO2: 100%   Filed Weights   09/20/18 0930  Weight: 208 lb (94.3 kg)    GENERAL:alert, no distress and comfortable SKIN: skin color, texture, turgor are normal, no rashes or significant lesions EYES: normal, Conjunctiva are pink and non-injected, sclera clear OROPHARYNX:no exudate, no erythema and lips, buccal mucosa, and tongue normal  NECK: supple, thyroid normal size, non-tender, without nodularity LYMPH:  no palpable lymphadenopathy in the cervical, axillary or inguinal LUNGS: clear to auscultation and percussion with normal breathing effort HEART: regular rate & rhythm and no murmurs and no lower extremity edema ABDOMEN:abdomen soft, non-tender and normal bowel sounds MUSCULOSKELETAL:no cyanosis of digits and no clubbing  NEURO: alert & oriented x 3 with fluent speech, no focal motor/sensory deficits EXTREMITIES: No lower extremity edema   LABORATORY DATA:  I have reviewed the data as listed CMP Latest Ref Rng & Units 09/13/2018 08/30/2018 08/16/2018  Glucose 70 - 99 mg/dL 150(H) 106(H) 156(H)  BUN 6 - 20 mg/dL _0 Creatinine 0.44 - 1.00 mg/dL 0.62 0.77 0.84  Sodium 135 - 145 mmol/L 143 143 143  Potassium 3.5 - 5.1 mmol/L 3.2(L) 3.5 3.6  Chloride 98 - 111 mmol/L 111 110 107  CO2 22 - 32 mmol/L _1 Calcium 8.9 - 10.3 mg/dL 8.8(L) 8.7(L) 8.8(L)  Total Protein 6.5 - 8.1 g/dL 6.6 6.8 6.8  Total Bilirubin 0.3 - 1.2 mg/dL 0.4 <0.2(L) <0.2(L)  Alkaline Phos 38 - 126 U/L 79 94 131(H)  AST 15 - 41 U/L 20 16 51(H)  ALT 0 - 44 U/L 18 21 82(H)    Lab Results  Component Value Date   WBC 4.8 09/20/2018   HGB 9.3 (L) 09/20/2018   HCT 27.3 (L) 09/20/2018   MCV 89.8 09/20/2018   PLT 294 09/20/2018     NEUTROABS 2.7 09/20/2018    ASSESSMENT & PLAN:  Malignant neoplasm of upper-outer quadrant of right breast in female, estrogen receptor negative ([facility]) 06/23/2018:Palpable lump in the right breast for 2 months UOQ by mammogram and ultrasound cystic and solid lesion at 10 o'clock position measuring 4.1 cm, axilla negative, 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 Neoadjuvant chemotherapy with Adriamycin and Cytoxan dose dense 4 followed byTaxol and carboplatinweekly 12 2. Followed by breast conserving surgery with sentinel lymph node study  3. Followed by adjuvant radiation therapy ------------------------------------------------------------------------------ Current treatment: Completed 4 cycles of dense Adriamycin and Cytoxan, today is cycle 2 Taxol Norma Fredrickson is every 3 weeks)  Labs have been reviewed  Chemo toxicities: Elevated LFTs: LFTs are come back to normal Chemotherapy-induced anemia: Monitoring closely today's hemoglobin is 9.3 She did not have any major side effects to Taxol or carboplatin other than some bone pain which lasted for 1 to 2 days.  Throughout this chemotherapy will have  to watch for neuropathy as well as neutropenia risks. echocardiogram 07/09/2018: EF 55 to 60% Return to clinic weekly for Taxol and every 2 weeks for follow-up with me   No orders of the defined types were placed in this encounter.  The patient has a good understanding of the overall plan. she agrees with it. she will call with any problems that may develop before the next visit here.   Harriette Ohara, MD 09/20/18

## 2018-09-20 NOTE — Patient Instructions (Signed)
[facility] Discharge Instructions for Patients Receiving Chemotherapy  Today you received the following chemotherapy agents:  Taxol.  To help prevent nausea and vomiting after your treatment, we encourage you to take your nausea medication as directed.   If you develop nausea and vomiting that is not controlled by your nausea medication, call the clinic.   BELOW ARE SYMPTOMS THAT SHOULD BE REPORTED IMMEDIATELY:  *FEVER GREATER THAN 100.5 F  *CHILLS WITH OR WITHOUT FEVER  NAUSEA AND VOMITING THAT IS NOT CONTROLLED WITH YOUR NAUSEA MEDICATION  *UNUSUAL SHORTNESS OF BREATH  *UNUSUAL BRUISING OR BLEEDING  TENDERNESS IN MOUTH AND THROAT WITH OR WITHOUT PRESENCE OF ULCERS  *URINARY PROBLEMS  *BOWEL PROBLEMS  UNUSUAL RASH Items with * indicate a potential emergency and should be followed up as soon as possible.  Feel free to call the clinic should you have any questions or concerns. The clinic phone number is [phone].  Please show the CHEMO ALERT CARD at check-in to the Emergency Department and triage nurse.   

## 2018-09-20 NOTE — Assessment & Plan Note (Signed)
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 Neoadjuvant chemotherapy with Adriamycin and Cytoxan dose dense 4 followed byTaxol and carboplatinweekly 12 2. Followed by breast conserving surgery with sentinel lymph node study  3. Followed by adjuvant radiation therapy ------------------------------------------------------------------------------ Current treatment: Completed 4 cycles of dense Adriamycin and Cytoxan, today is cycle 2 Taxol Norma Fredrickson is every 3 weeks)  Labs have been reviewed  Chemo toxicities: Elevated LFTs: We will continue with the treatment and will monitor it closely. Chemotherapy-induced anemia: Monitoring closely  echocardiogram 07/09/2018: EF 55 to 60% Return to clinic weekly for Taxol and every 2 weeks for follow-up with me

## 2018-09-27 ENCOUNTER — Inpatient Hospital Stay: Payer: 59

## 2018-09-27 VITALS — BP 124/86 | HR 80 | Temp 98.6°F | Resp 18

## 2018-09-27 DIAGNOSIS — C50411 Malignant neoplasm of upper-outer quadrant of right female breast: Secondary | ICD-10-CM | POA: Diagnosis not present

## 2018-09-27 DIAGNOSIS — Z171 Estrogen receptor negative status [ER-]: Secondary | ICD-10-CM

## 2018-09-27 DIAGNOSIS — Z95828 Presence of other vascular implants and grafts: Secondary | ICD-10-CM

## 2018-09-27 LAB — CBC WITH DIFFERENTIAL (CANCER CENTER ONLY)
Band Neutrophils: 2 %
Basophils Absolute: 0 10*3/uL (ref 0.0–0.1)
Basophils Relative: 1 %
Eosinophils Absolute: 0 10*3/uL (ref 0.0–0.5)
Eosinophils Relative: 1 %
HCT: 27.8 % — ABNORMAL LOW (ref 36.0–46.0)
Hemoglobin: 9.4 g/dL — ABNORMAL LOW (ref 12.0–15.0)
LYMPHS ABS: 1.4 10*3/uL (ref 0.7–4.0)
Lymphocytes Relative: 42 %
MCH: 30.6 pg (ref 26.0–34.0)
MCHC: 33.8 g/dL (ref 30.0–36.0)
MCV: 90.6 fL (ref 80.0–100.0)
MONO ABS: 0.3 10*3/uL (ref 0.1–1.0)
MONOS PCT: 9 %
Neutro Abs: 1.6 10*3/uL — ABNORMAL LOW (ref 1.7–17.7)
Neutrophils Relative %: 45 %
PLATELETS: 197 10*3/uL (ref 150–400)
RBC: 3.07 MIL/uL — AB (ref 3.87–5.11)
RDW: 20.5 % — ABNORMAL HIGH (ref 11.5–15.5)
WBC: 3.4 10*3/uL — AB (ref 4.0–10.5)
nRBC: 0 % (ref 0.0–0.2)

## 2018-09-27 LAB — CMP (CANCER CENTER ONLY)
ALBUMIN: 3.5 g/dL (ref 3.5–5.0)
ALK PHOS: 65 U/L (ref 38–126)
ALT: 37 U/L (ref 0–44)
AST: 21 U/L (ref 15–41)
Anion gap: 7 (ref 5–15)
BUN: 7 mg/dL (ref 6–20)
CALCIUM: 8.5 mg/dL — AB (ref 8.9–10.3)
CO2: 25 mmol/L (ref 22–32)
CREATININE: 0.71 mg/dL (ref 0.44–1.00)
Chloride: 110 mmol/L (ref 98–111)
GFR, Est AFR Am: >60 mL/min (ref >60)
GLUCOSE: 113 mg/dL — AB (ref 70–99)
Potassium: 3.4 mmol/L — ABNORMAL LOW (ref 3.5–5.1)
Sodium: 142 mmol/L (ref 135–145)
TOTAL PROTEIN: 6.5 g/dL (ref 6.5–8.1)

## 2018-09-27 MED ORDER — DEXAMETHASONE SODIUM PHOSPHATE 10 MG/ML IJ SOLN
10.0000 mg | Freq: Once | INTRAMUSCULAR | Status: AC
  Administered 2018-09-27: 10 mg via INTRAVENOUS

## 2018-09-27 MED ORDER — DIPHENHYDRAMINE HCL 50 MG/ML IJ SOLN
INTRAMUSCULAR | Status: AC
  Filled 2018-09-27: qty 1

## 2018-09-27 MED ORDER — DIPHENHYDRAMINE HCL 50 MG/ML IJ SOLN
25.0000 mg | Freq: Once | INTRAMUSCULAR | Status: AC
  Administered 2018-09-27: 25 mg via INTRAVENOUS

## 2018-09-27 MED ORDER — SODIUM CHLORIDE 0.9% FLUSH
10.0000 mL | INTRAVENOUS | Status: DC | PRN
  Administered 2018-09-27: 10 mL
  Filled 2018-09-27: qty 10

## 2018-09-27 MED ORDER — FAMOTIDINE IN NACL 20-0.9 MG/50ML-% IV SOLN
INTRAVENOUS | Status: AC
  Filled 2018-09-27: qty 50

## 2018-09-27 MED ORDER — SODIUM CHLORIDE 0.9 % IV SOLN
Freq: Once | INTRAVENOUS | Status: AC
  Administered 2018-09-27: 10:00:00 via INTRAVENOUS
  Filled 2018-09-27: qty 250

## 2018-09-27 MED ORDER — SODIUM CHLORIDE 0.9 % IV SOLN
80.0000 mg/m2 | Freq: Once | INTRAVENOUS | Status: AC
  Administered 2018-09-27: 168 mg via INTRAVENOUS
  Filled 2018-09-27: qty 28

## 2018-09-27 MED ORDER — DEXAMETHASONE SODIUM PHOSPHATE 10 MG/ML IJ SOLN
INTRAMUSCULAR | Status: AC
  Filled 2018-09-27: qty 1

## 2018-09-27 MED ORDER — FAMOTIDINE IN NACL 20-0.9 MG/50ML-% IV SOLN
20.0000 mg | Freq: Once | INTRAVENOUS | Status: AC
  Administered 2018-09-27: 20 mg via INTRAVENOUS

## 2018-09-27 MED ORDER — HEPARIN SOD (PORK) LOCK FLUSH 100 UNIT/ML IV SOLN
500.0000 [IU] | Freq: Once | INTRAVENOUS | Status: AC | PRN
  Administered 2018-09-27: 500 [IU]
  Filled 2018-09-27: qty 5

## 2018-09-27 NOTE — Patient Instructions (Signed)
[facility] Discharge Instructions for Patients Receiving Chemotherapy  Today you received the following chemotherapy agents:  Taxol.  To help prevent nausea and vomiting after your treatment, we encourage you to take your nausea medication as directed.   If you develop nausea and vomiting that is not controlled by your nausea medication, call the clinic.   BELOW ARE SYMPTOMS THAT SHOULD BE REPORTED IMMEDIATELY:  *FEVER GREATER THAN 100.5 F  *CHILLS WITH OR WITHOUT FEVER  NAUSEA AND VOMITING THAT IS NOT CONTROLLED WITH YOUR NAUSEA MEDICATION  *UNUSUAL SHORTNESS OF BREATH  *UNUSUAL BRUISING OR BLEEDING  TENDERNESS IN MOUTH AND THROAT WITH OR WITHOUT PRESENCE OF ULCERS  *URINARY PROBLEMS  *BOWEL PROBLEMS  UNUSUAL RASH Items with * indicate a potential emergency and should be followed up as soon as possible.  Feel free to call the clinic should you have any questions or concerns. The clinic phone number is [phone].  Please show the CHEMO ALERT CARD at check-in to the Emergency Department and triage nurse.   

## 2018-10-04 ENCOUNTER — Inpatient Hospital Stay: Payer: 59

## 2018-10-04 ENCOUNTER — Inpatient Hospital Stay: Payer: 59 | Attending: Genetic Counselor

## 2018-10-04 ENCOUNTER — Encounter: Payer: Self-pay | Admitting: *Deleted

## 2018-10-04 ENCOUNTER — Telehealth: Payer: Self-pay | Admitting: Hematology and Oncology

## 2018-10-04 ENCOUNTER — Inpatient Hospital Stay (HOSPITAL_BASED_OUTPATIENT_CLINIC_OR_DEPARTMENT_OTHER): Payer: 59 | Admitting: Hematology and Oncology

## 2018-10-04 DIAGNOSIS — Z171 Estrogen receptor negative status [ER-]: Secondary | ICD-10-CM | POA: Diagnosis not present

## 2018-10-04 DIAGNOSIS — C50411 Malignant neoplasm of upper-outer quadrant of right female breast: Secondary | ICD-10-CM

## 2018-10-04 DIAGNOSIS — R739 Hyperglycemia, unspecified: Secondary | ICD-10-CM | POA: Insufficient documentation

## 2018-10-04 DIAGNOSIS — Z5111 Encounter for antineoplastic chemotherapy: Secondary | ICD-10-CM | POA: Diagnosis not present

## 2018-10-04 DIAGNOSIS — R7989 Other specified abnormal findings of blood chemistry: Secondary | ICD-10-CM

## 2018-10-04 DIAGNOSIS — D6489 Other specified anemias: Secondary | ICD-10-CM

## 2018-10-04 DIAGNOSIS — D6481 Anemia due to antineoplastic chemotherapy: Secondary | ICD-10-CM | POA: Insufficient documentation

## 2018-10-04 DIAGNOSIS — Z95828 Presence of other vascular implants and grafts: Secondary | ICD-10-CM

## 2018-10-04 LAB — CBC WITH DIFFERENTIAL (CANCER CENTER ONLY)
ABS IMMATURE GRANULOCYTES: 0.02 10*3/uL (ref 0.00–0.07)
BASOS PCT: 1 %
Basophils Absolute: 0.1 10*3/uL (ref 0.0–0.1)
Eosinophils Absolute: 0 10*3/uL (ref 0.0–0.5)
Eosinophils Relative: 1 %
HCT: 30.2 % — ABNORMAL LOW (ref 36.0–46.0)
HEMOGLOBIN: 9.9 g/dL — AB (ref 12.0–15.0)
IMMATURE GRANULOCYTES: 1 %
LYMPHS PCT: 44 %
Lymphs Abs: 1.7 10*3/uL (ref 0.7–4.0)
MCH: 30.9 pg (ref 26.0–34.0)
MCHC: 32.8 g/dL (ref 30.0–36.0)
MCV: 94.4 fL (ref 80.0–100.0)
Monocytes Absolute: 0.4 10*3/uL (ref 0.1–1.0)
Monocytes Relative: 9 %
NEUTROS ABS: 1.7 10*3/uL (ref 1.7–7.7)
NEUTROS PCT: 44 %
PLATELETS: 152 10*3/uL (ref 150–400)
RBC: 3.2 MIL/uL — ABNORMAL LOW (ref 3.87–5.11)
RDW: 21 % — ABNORMAL HIGH (ref 11.5–15.5)
WBC Count: 3.8 10*3/uL — ABNORMAL LOW (ref 4.0–10.5)
nRBC: 0.5 % — ABNORMAL HIGH (ref 0.0–0.2)

## 2018-10-04 LAB — CMP (CANCER CENTER ONLY)
ALBUMIN: 3.6 g/dL (ref 3.5–5.0)
ALT: 52 U/L — ABNORMAL HIGH (ref 0–44)
ANION GAP: 7 (ref 5–15)
AST: 26 U/L (ref 15–41)
Alkaline Phosphatase: 72 U/L (ref 38–126)
BILIRUBIN TOTAL: 0.3 mg/dL (ref 0.3–1.2)
BUN: 6 mg/dL (ref 6–20)
CHLORIDE: 109 mmol/L (ref 98–111)
CO2: 25 mmol/L (ref 22–32)
Calcium: 8.8 mg/dL — ABNORMAL LOW (ref 8.9–10.3)
Creatinine: 0.75 mg/dL (ref 0.44–1.00)
GFR, Est AFR Am: >60 mL/min (ref >60)
GFR, Estimated: >60 mL/min (ref >60)
Glucose, Bld: 173 mg/dL — ABNORMAL HIGH (ref 70–99)
POTASSIUM: 3.6 mmol/L (ref 3.5–5.1)
SODIUM: 141 mmol/L (ref 135–145)
TOTAL PROTEIN: 6.7 g/dL (ref 6.5–8.1)

## 2018-10-04 MED ORDER — SODIUM CHLORIDE 0.9 % IV SOLN
700.0000 mg | Freq: Once | INTRAVENOUS | Status: AC
  Administered 2018-10-04: 700 mg via INTRAVENOUS
  Filled 2018-10-04: qty 70

## 2018-10-04 MED ORDER — SODIUM CHLORIDE 0.9 % IV SOLN
80.0000 mg/m2 | Freq: Once | INTRAVENOUS | Status: AC
  Administered 2018-10-04: 168 mg via INTRAVENOUS
  Filled 2018-10-04: qty 28

## 2018-10-04 MED ORDER — FAMOTIDINE IN NACL 20-0.9 MG/50ML-% IV SOLN: 20.0000 mg | Freq: Once | INTRAVENOUS | Status: DC

## 2018-10-04 MED ORDER — HEPARIN SOD (PORK) LOCK FLUSH 100 UNIT/ML IV SOLN
500.0000 [IU] | Freq: Once | INTRAVENOUS | Status: AC | PRN
  Administered 2018-10-04: 500 [IU]
  Filled 2018-10-04: qty 5

## 2018-10-04 MED ORDER — SODIUM CHLORIDE 0.9 % IV SOLN
20.0000 mg | Freq: Once | INTRAVENOUS | Status: AC
  Administered 2018-10-04: 20 mg via INTRAVENOUS
  Filled 2018-10-04: qty 2

## 2018-10-04 MED ORDER — SODIUM CHLORIDE 0.9% FLUSH
10.0000 mL | INTRAVENOUS | Status: DC | PRN
  Administered 2018-10-04: 10 mL
  Filled 2018-10-04: qty 10

## 2018-10-04 MED ORDER — DIPHENHYDRAMINE HCL 50 MG/ML IJ SOLN
25.0000 mg | Freq: Once | INTRAMUSCULAR | Status: AC
  Administered 2018-10-04: 25 mg via INTRAVENOUS

## 2018-10-04 MED ORDER — PALONOSETRON HCL INJECTION 0.25 MG/5ML
INTRAVENOUS | Status: AC
  Filled 2018-10-04: qty 5

## 2018-10-04 MED ORDER — DIPHENHYDRAMINE HCL 50 MG/ML IJ SOLN
INTRAMUSCULAR | Status: AC
  Filled 2018-10-04: qty 1

## 2018-10-04 MED ORDER — PALONOSETRON HCL INJECTION 0.25 MG/5ML
0.2500 mg | Freq: Once | INTRAVENOUS | Status: AC
  Administered 2018-10-04: 0.25 mg via INTRAVENOUS

## 2018-10-04 MED ORDER — SODIUM CHLORIDE 0.9 % IV SOLN
Freq: Once | INTRAVENOUS | Status: AC
  Administered 2018-10-04: 11:00:00 via INTRAVENOUS
  Filled 2018-10-04: qty 250

## 2018-10-04 MED ORDER — SODIUM CHLORIDE 0.9 % IV SOLN
Freq: Once | INTRAVENOUS | Status: AC
  Administered 2018-10-04: 11:00:00 via INTRAVENOUS
  Filled 2018-10-04: qty 5

## 2018-10-04 NOTE — Patient Instructions (Signed)
[facility] Discharge Instructions for Patients Receiving Chemotherapy  Today you received the following chemotherapy agents:  Taxol.  To help prevent nausea and vomiting after your treatment, we encourage you to take your nausea medication as directed.   If you develop nausea and vomiting that is not controlled by your nausea medication, call the clinic.   BELOW ARE SYMPTOMS THAT SHOULD BE REPORTED IMMEDIATELY:  *FEVER GREATER THAN 100.5 F  *CHILLS WITH OR WITHOUT FEVER  NAUSEA AND VOMITING THAT IS NOT CONTROLLED WITH YOUR NAUSEA MEDICATION  *UNUSUAL SHORTNESS OF BREATH  *UNUSUAL BRUISING OR BLEEDING  TENDERNESS IN MOUTH AND THROAT WITH OR WITHOUT PRESENCE OF ULCERS  *URINARY PROBLEMS  *BOWEL PROBLEMS  UNUSUAL RASH Items with * indicate a potential emergency and should be followed up as soon as possible.  Feel free to call the clinic should you have any questions or concerns. The clinic phone number is [phone].  Please show the CHEMO ALERT CARD at check-in to the Emergency Department and triage nurse.   

## 2018-10-04 NOTE — Telephone Encounter (Signed)
No 11/4 los orders.

## 2018-10-04 NOTE — Assessment & Plan Note (Signed)
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 Neoadjuvant chemotherapy with Adriamycin and Cytoxan dose dense 4 followed byTaxol and carboplatinweekly 12 2. Followed by breast conserving surgery with sentinel lymph node study  3. Followed by adjuvant radiation therapy ------------------------------------------------------------------------------ Current treatment:Completed 4 cycles ofdense Adriamycin and Cytoxan,today is cycle 2 Taxol (carbo is every 3 weeks)  Labs have been reviewed  Chemo toxicities: 1. Elevated LFTs: LFTs are come back to normal 2. Chemotherapy-induced anemia: Monitoring closely today's hemoglobin is 9.3  Return to clinic weekly for Taxol and every 2 weeks for follow-up with me

## 2018-10-04 NOTE — Progress Notes (Signed)
Patient Care Team: Tysinger, Camelia Eng, PA-C as PCP - General (Family Medicine) Excell Seltzer, MD as Consulting Physician (General Surgery) Nicholas Lose, MD as Consulting Physician (Hematology and Oncology) Gery Pray, MD as Consulting Physician (Radiation Oncology)  DIAGNOSIS:  Encounter Diagnosis  Name Primary?  . 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 The Common Hereditary Cancers Panel offered by [facility] includes sequencing and/or deletion duplication testing of the following 47 genes: APC, ATM, AXIN2, BARD1, BMPR1A, BRCA1, BRCA2, BRIP1, CDH1, CDKN2A (p14ARF), CDKN2A (p16INK4a), CKD4, CHEK2, CTNNA1, DICER1, EPCAM (Deletion/duplication testing only), GREM1 (promoter region deletion/duplication testing only), KIT, MEN1, MLH1, MSH2, MSH3, MSH6, MUTYH, NBN, NF1, NHTL1, PALB2, PDGFRA, PMS2, POLD1, POLE, PTEN, RAD50, RAD51C, RAD51D, SDHB, SDHC, SDHD, SMAD4, SMARCA4. STK11, TP53, TSC1, TSC2, and VHL.  The following genes were evaluated for sequence changes only: SDHA and HOXB13 c.251G>A variant only.  The report date is 07/23/2018.     CHIEF COMPLIANT: Cycle 4 Taxol  INTERVAL HISTORY: JAMONICA SCHOFF is a 47 year old with above-mentioned history of right breast cancer currently on neoadjuvant  chemotherapy and today is cycle 4 of Taxol.  She does not have neuropathy.  She had some muscle aches and pains for which she took Tylenol and improved.  Denies any nausea vomiting.  She does have mild to moderate fatigue.  REVIEW OF SYSTEMS:   Constitutional: Denies fevers, complains of fatigue Eyes: Denies blurriness of vision Ears, nose, mouth, throat, and face: Denies mucositis or sore throat Respiratory: Denies cough, dyspnea or wheezes Cardiovascular: Denies palpitation, chest discomfort Gastrointestinal:  Denies nausea, heartburn or change in bowel habits Skin: Denies abnormal skin rashes Lymphatics: Denies new lymphadenopathy or easy bruising Neurological:Denies numbness, tingling or new weaknesses Behavioral/Psych: Mood is stable, no new changes  Extremities: No lower extremity edema   All other systems were reviewed with the patient and are negative.  I have reviewed the past medical history, past surgical history, social history and family history with the patient and they are unchanged from previous note.  ALLERGIES:  has No Known Allergies.  MEDICATIONS:  Current Outpatient Medications  Medication Sig Dispense Refill  . acetaminophen (TYLENOL) 500 MG tablet Take 500 mg by mouth every 6 (six) hours as needed for mild pain.     Marland Kitchen lidocaine-prilocaine (EMLA) cream Apply to affected area once 30 g 3  . LORazepam (ATIVAN) 0.5 MG tablet Take 1 tablet (0.5 mg total) by mouth at bedtime as needed for sleep. 30 tablet 0  . naproxen sodium (ALEVE) 220 MG tablet Take 220 mg by mouth as needed (pain).     . ondansetron (ZOFRAN) 8 MG tablet Take 1 tablet (8 mg total) by mouth 2 (two) times daily as needed. Start on the third day after chemotherapy. 30 tablet 1  .  prochlorperazine (COMPAZINE) 10 MG tablet Take 1 tablet (10 mg total) by mouth every 6 (six) hours as needed (Nausea or vomiting). 30 tablet 1  . VITAMIN E PO Take by mouth daily.      No current facility-administered  medications for this visit.     PHYSICAL EXAMINATION: ECOG PERFORMANCE STATUS: 1 - Symptomatic but completely ambulatory  Vitals:   10/04/18 0959  BP: (!) 146/98  Pulse: 87  Resp: 16  Temp: 98.3 F (36.8 C)  SpO2: 100%   Filed Weights   10/04/18 0959  Weight: 208 lb 14.4 oz (94.8 kg)    GENERAL:alert, no distress and comfortable SKIN: skin color, texture, turgor are normal, no rashes or significant lesions EYES: normal, Conjunctiva are pink and non-injected, sclera clear OROPHARYNX:no exudate, no erythema and lips, buccal mucosa, and tongue normal  NECK: supple, thyroid normal size, non-tender, without nodularity LYMPH:  no palpable lymphadenopathy in the cervical, axillary or inguinal LUNGS: clear to auscultation and percussion with normal breathing effort HEART: regular rate & rhythm and no murmurs and no lower extremity edema ABDOMEN:abdomen soft, non-tender and normal bowel sounds MUSCULOSKELETAL:no cyanosis of digits and no clubbing  NEURO: alert & oriented x 3 with fluent speech, no focal motor/sensory deficits EXTREMITIES: No lower extremity edema   LABORATORY DATA:  I have reviewed the data as listed CMP Latest Ref Rng & Units 09/27/2018 09/20/2018 09/13/2018  Glucose 70 - 99 mg/dL 113(H) 120(H) 150(H)  BUN 6 - 20 mg/dL '7 9 8  '$ Creatinine 0.44 - 1.00 mg/dL 0.71 0.70 0.62  Sodium 135 - 145 mmol/L 142 141 143  Potassium 3.5 - 5.1 mmol/L 3.4(L) 3.7 3.2(L)  Chloride 98 - 111 mmol/L 110 110 111  CO2 22 - 32 mmol/L '25 24 25  '$ Calcium 8.9 - 10.3 mg/dL 8.5(L) 8.7(L) 8.8(L)  Total Protein 6.5 - 8.1 g/dL 6.5 6.5 6.6  Total Bilirubin 0.3 - 1.2 mg/dL <0.2(L) <0.2(L) 0.4  Alkaline Phos 38 - 126 U/L 65 69 79  AST 15 - 41 U/L '21 18 20  '$ ALT 0 - 44 U/L 37 19 18    Lab Results  Component Value Date   WBC 3.8 (L) 10/04/2018   HGB 9.9 (L) 10/04/2018   HCT 30.2 (L) 10/04/2018   MCV 94.4 10/04/2018   PLT 152 10/04/2018   NEUTROABS 1.7 10/04/2018    ASSESSMENT & PLAN:    Malignant neoplasm of upper-outer quadrant of right breast in female, estrogen receptor negative (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 Neoadjuvant chemotherapy with Adriamycin and Cytoxan dose dense 4 followed byTaxol and carboplatinweekly 12 2. Followed by breast conserving surgery with sentinel lymph node study  3. Followed by adjuvant radiation therapy ------------------------------------------------------------------------------ Current treatment:Completed 4 cycles ofdense Adriamycin and Cytoxan,today is cycle 2 Taxol (carbo is every 3 weeks)  Labs have been reviewed  Chemo toxicities: 1. Elevated LFTs: Mild elevation of ALT 2. Chemotherapy-induced anemia: Monitoring closely today's hemoglobin is 9.3 3.  Elevated blood sugars: Discussed with her about cutting down on carbohydrate intake.  Return to clinic weekly for Taxol and every 2 weeks for follow-up with me   No orders of the defined types were placed in this encounter.  The patient has a good understanding  of the overall plan. she agrees with it. she will call with any problems that may develop before the next visit here.   Harriette Ohara, MD 10/04/18

## 2018-10-11 ENCOUNTER — Inpatient Hospital Stay: Payer: 59

## 2018-10-11 VITALS — BP 133/85 | HR 99 | Temp 98.8°F | Resp 16

## 2018-10-11 DIAGNOSIS — Z171 Estrogen receptor negative status [ER-]: Secondary | ICD-10-CM

## 2018-10-11 DIAGNOSIS — C50411 Malignant neoplasm of upper-outer quadrant of right female breast: Secondary | ICD-10-CM

## 2018-10-11 DIAGNOSIS — Z5111 Encounter for antineoplastic chemotherapy: Secondary | ICD-10-CM | POA: Diagnosis not present

## 2018-10-11 DIAGNOSIS — Z95828 Presence of other vascular implants and grafts: Secondary | ICD-10-CM

## 2018-10-11 LAB — CBC WITH DIFFERENTIAL (CANCER CENTER ONLY)
Abs Immature Granulocytes: 0.02 10*3/uL (ref 0.00–0.07)
BASOS PCT: 1 %
Basophils Absolute: 0 10*3/uL (ref 0.0–0.1)
Eosinophils Absolute: 0 10*3/uL (ref 0.0–0.5)
Eosinophils Relative: 1 %
HCT: 29 % — ABNORMAL LOW (ref 36.0–46.0)
HEMOGLOBIN: 9.7 g/dL — AB (ref 12.0–15.0)
Immature Granulocytes: 1 %
LYMPHS PCT: 41 %
Lymphs Abs: 1.3 10*3/uL (ref 0.7–4.0)
MCH: 32.2 pg (ref 26.0–34.0)
MCHC: 33.4 g/dL (ref 30.0–36.0)
MCV: 96.3 fL (ref 80.0–100.0)
Monocytes Absolute: 0.2 10*3/uL (ref 0.1–1.0)
Monocytes Relative: 5 %
NEUTROS ABS: 1.7 10*3/uL (ref 1.7–7.7)
Neutrophils Relative %: 51 %
PLATELETS: 155 10*3/uL (ref 150–400)
RBC: 3.01 MIL/uL — AB (ref 3.87–5.11)
RDW: 20.4 % — ABNORMAL HIGH (ref 11.5–15.5)
WBC: 3.3 10*3/uL — AB (ref 4.0–10.5)
nRBC: 0 % (ref 0.0–0.2)

## 2018-10-11 LAB — CMP (CANCER CENTER ONLY)
ALK PHOS: 79 U/L (ref 38–126)
ALT: 36 U/L (ref 0–44)
ANION GAP: 8 (ref 5–15)
AST: 21 U/L (ref 15–41)
Albumin: 3.6 g/dL (ref 3.5–5.0)
BUN: 9 mg/dL (ref 6–20)
CALCIUM: 8.6 mg/dL — AB (ref 8.9–10.3)
CHLORIDE: 110 mmol/L (ref 98–111)
CO2: 25 mmol/L (ref 22–32)
Creatinine: 0.73 mg/dL (ref 0.44–1.00)
GFR, Est AFR Am: >60 mL/min (ref >60)
GFR, Estimated: >60 mL/min (ref >60)
GLUCOSE: 140 mg/dL — AB (ref 70–99)
Potassium: 3.5 mmol/L (ref 3.5–5.1)
SODIUM: 143 mmol/L (ref 135–145)
Total Bilirubin: 0.3 mg/dL (ref 0.3–1.2)
Total Protein: 6.6 g/dL (ref 6.5–8.1)

## 2018-10-11 MED ORDER — DEXAMETHASONE SODIUM PHOSPHATE 10 MG/ML IJ SOLN
10.0000 mg | Freq: Once | INTRAMUSCULAR | Status: AC
  Administered 2018-10-11: 10 mg via INTRAVENOUS

## 2018-10-11 MED ORDER — SODIUM CHLORIDE 0.9% FLUSH
10.0000 mL | INTRAVENOUS | Status: DC | PRN
  Administered 2018-10-11: 10 mL
  Filled 2018-10-11: qty 10

## 2018-10-11 MED ORDER — DIPHENHYDRAMINE HCL 50 MG/ML IJ SOLN
INTRAMUSCULAR | Status: AC
  Filled 2018-10-11: qty 1

## 2018-10-11 MED ORDER — SODIUM CHLORIDE 0.9 % IV SOLN
20.0000 mg | Freq: Once | INTRAVENOUS | Status: AC
  Administered 2018-10-11: 20 mg via INTRAVENOUS
  Filled 2018-10-11: qty 2

## 2018-10-11 MED ORDER — DIPHENHYDRAMINE HCL 50 MG/ML IJ SOLN
25.0000 mg | Freq: Once | INTRAMUSCULAR | Status: AC
  Administered 2018-10-11: 25 mg via INTRAVENOUS

## 2018-10-11 MED ORDER — DEXAMETHASONE SODIUM PHOSPHATE 10 MG/ML IJ SOLN
INTRAMUSCULAR | Status: AC
  Filled 2018-10-11: qty 1

## 2018-10-11 MED ORDER — FAMOTIDINE IN NACL 20-0.9 MG/50ML-% IV SOLN: 20.0000 mg | Freq: Once | INTRAVENOUS | Status: DC

## 2018-10-11 MED ORDER — HEPARIN SOD (PORK) LOCK FLUSH 100 UNIT/ML IV SOLN
500.0000 [IU] | Freq: Once | INTRAVENOUS | Status: AC | PRN
  Administered 2018-10-11: 500 [IU]
  Filled 2018-10-11: qty 5

## 2018-10-11 MED ORDER — SODIUM CHLORIDE 0.9 % IV SOLN
Freq: Once | INTRAVENOUS | Status: AC
  Administered 2018-10-11: 11:00:00 via INTRAVENOUS
  Filled 2018-10-11: qty 250

## 2018-10-11 MED ORDER — SODIUM CHLORIDE 0.9 % IV SOLN
80.0000 mg/m2 | Freq: Once | INTRAVENOUS | Status: AC
  Administered 2018-10-11: 168 mg via INTRAVENOUS
  Filled 2018-10-11: qty 28

## 2018-10-11 NOTE — Patient Instructions (Signed)
[facility] Discharge Instructions for Patients Receiving Chemotherapy  Today you received the following chemotherapy agents:  Taxol.  To help prevent nausea and vomiting after your treatment, we encourage you to take your nausea medication as directed.   If you develop nausea and vomiting that is not controlled by your nausea medication, call the clinic.   BELOW ARE SYMPTOMS THAT SHOULD BE REPORTED IMMEDIATELY:  *FEVER GREATER THAN 100.5 F  *CHILLS WITH OR WITHOUT FEVER  NAUSEA AND VOMITING THAT IS NOT CONTROLLED WITH YOUR NAUSEA MEDICATION  *UNUSUAL SHORTNESS OF BREATH  *UNUSUAL BRUISING OR BLEEDING  TENDERNESS IN MOUTH AND THROAT WITH OR WITHOUT PRESENCE OF ULCERS  *URINARY PROBLEMS  *BOWEL PROBLEMS  UNUSUAL RASH Items with * indicate a potential emergency and should be followed up as soon as possible.  Feel free to call the clinic should you have any questions or concerns. The clinic phone number is [phone].  Please show the CHEMO ALERT CARD at check-in to the Emergency Department and triage nurse.   

## 2018-10-15 DIAGNOSIS — Z171 Estrogen receptor negative status [ER-]: Secondary | ICD-10-CM | POA: Diagnosis not present

## 2018-10-15 DIAGNOSIS — Z5111 Encounter for antineoplastic chemotherapy: Secondary | ICD-10-CM | POA: Diagnosis not present

## 2018-10-15 DIAGNOSIS — C50411 Malignant neoplasm of upper-outer quadrant of right female breast: Secondary | ICD-10-CM | POA: Diagnosis not present

## 2018-10-18 ENCOUNTER — Other Ambulatory Visit: Payer: Self-pay | Admitting: *Deleted

## 2018-10-18 ENCOUNTER — Inpatient Hospital Stay: Payer: 59

## 2018-10-18 ENCOUNTER — Inpatient Hospital Stay (HOSPITAL_BASED_OUTPATIENT_CLINIC_OR_DEPARTMENT_OTHER): Payer: 59 | Admitting: Hematology and Oncology

## 2018-10-18 ENCOUNTER — Telehealth: Payer: Self-pay | Admitting: Medical

## 2018-10-18 DIAGNOSIS — R7989 Other specified abnormal findings of blood chemistry: Secondary | ICD-10-CM | POA: Diagnosis not present

## 2018-10-18 DIAGNOSIS — Z171 Estrogen receptor negative status [ER-]: Secondary | ICD-10-CM

## 2018-10-18 DIAGNOSIS — D6481 Anemia due to antineoplastic chemotherapy: Secondary | ICD-10-CM | POA: Diagnosis not present

## 2018-10-18 DIAGNOSIS — Z95828 Presence of other vascular implants and grafts: Secondary | ICD-10-CM

## 2018-10-18 DIAGNOSIS — Z5111 Encounter for antineoplastic chemotherapy: Secondary | ICD-10-CM | POA: Diagnosis not present

## 2018-10-18 DIAGNOSIS — C50411 Malignant neoplasm of upper-outer quadrant of right female breast: Secondary | ICD-10-CM

## 2018-10-18 DIAGNOSIS — R739 Hyperglycemia, unspecified: Secondary | ICD-10-CM

## 2018-10-18 LAB — CBC WITH DIFFERENTIAL (CANCER CENTER ONLY)
ABS IMMATURE GRANULOCYTES: 0.01 10*3/uL (ref 0.00–0.07)
Basophils Absolute: 0 10*3/uL (ref 0.0–0.1)
Basophils Relative: 1 %
Eosinophils Absolute: 0 10*3/uL (ref 0.0–0.5)
Eosinophils Relative: 1 %
HEMATOCRIT: 29.5 % — AB (ref 36.0–46.0)
HEMOGLOBIN: 9.6 g/dL — AB (ref 12.0–15.0)
Immature Granulocytes: 0 %
LYMPHS ABS: 1.6 10*3/uL (ref 0.7–4.0)
LYMPHS PCT: 41 %
MCH: 32.9 pg (ref 26.0–34.0)
MCHC: 32.5 g/dL (ref 30.0–36.0)
MCV: 101 fL — ABNORMAL HIGH (ref 80.0–100.0)
MONO ABS: 0.6 10*3/uL (ref 0.1–1.0)
Monocytes Relative: 15 %
NEUTROS ABS: 1.6 10*3/uL — AB (ref 1.7–7.7)
Neutrophils Relative %: 42 %
Platelet Count: 267 10*3/uL (ref 150–400)
RBC: 2.92 MIL/uL — AB (ref 3.87–5.11)
RDW: 21.2 % — ABNORMAL HIGH (ref 11.5–15.5)
WBC: 3.8 10*3/uL — AB (ref 4.0–10.5)
nRBC: 0.5 % — ABNORMAL HIGH (ref 0.0–0.2)

## 2018-10-18 LAB — CMP (CANCER CENTER ONLY)
ALBUMIN: 3.7 g/dL (ref 3.5–5.0)
ALK PHOS: 77 U/L (ref 38–126)
ALT: 62 U/L — AB (ref 0–44)
AST: 35 U/L (ref 15–41)
Anion gap: 7 (ref 5–15)
BUN: 8 mg/dL (ref 6–20)
CALCIUM: 9 mg/dL (ref 8.9–10.3)
CO2: 27 mmol/L (ref 22–32)
CREATININE: 0.75 mg/dL (ref 0.44–1.00)
Chloride: 107 mmol/L (ref 98–111)
GFR, Estimated: >60 mL/min (ref >60)
GLUCOSE: 109 mg/dL — AB (ref 70–99)
Potassium: 3.7 mmol/L (ref 3.5–5.1)
SODIUM: 141 mmol/L (ref 135–145)
Total Bilirubin: 0.3 mg/dL (ref 0.3–1.2)
Total Protein: 6.9 g/dL (ref 6.5–8.1)

## 2018-10-18 MED ORDER — DEXAMETHASONE SODIUM PHOSPHATE 10 MG/ML IJ SOLN
INTRAMUSCULAR | Status: AC
  Filled 2018-10-18: qty 1

## 2018-10-18 MED ORDER — ALTEPLASE 2 MG IJ SOLR
2.0000 mg | Freq: Once | INTRAMUSCULAR | Status: DC | PRN
  Filled 2018-10-18: qty 2

## 2018-10-18 MED ORDER — DIPHENHYDRAMINE HCL 50 MG/ML IJ SOLN
25.0000 mg | Freq: Once | INTRAMUSCULAR | Status: AC
  Administered 2018-10-18: 25 mg via INTRAVENOUS

## 2018-10-18 MED ORDER — SODIUM CHLORIDE 0.9% FLUSH
10.0000 mL | INTRAVENOUS | Status: DC | PRN
  Administered 2018-10-18: 10 mL
  Filled 2018-10-18: qty 10

## 2018-10-18 MED ORDER — ALTEPLASE 2 MG IJ SOLR
INTRAMUSCULAR | Status: AC
  Filled 2018-10-18: qty 2

## 2018-10-18 MED ORDER — MAGIC MOUTHWASH W/LIDOCAINE: 5.0000 mL | Freq: Four times a day (QID) | ORAL | 1 refills | Status: DC | PRN

## 2018-10-18 MED ORDER — SODIUM CHLORIDE 0.9 % IV SOLN
Freq: Once | INTRAVENOUS | Status: AC
  Administered 2018-10-18: 11:00:00 via INTRAVENOUS
  Filled 2018-10-18: qty 250

## 2018-10-18 MED ORDER — FAMOTIDINE IN NACL 20-0.9 MG/50ML-% IV SOLN
20.0000 mg | Freq: Once | INTRAVENOUS | Status: AC
  Administered 2018-10-18: 20 mg via INTRAVENOUS

## 2018-10-18 MED ORDER — DEXAMETHASONE SODIUM PHOSPHATE 10 MG/ML IJ SOLN
10.0000 mg | Freq: Once | INTRAMUSCULAR | Status: AC
  Administered 2018-10-18: 10 mg via INTRAVENOUS

## 2018-10-18 MED ORDER — HEPARIN SOD (PORK) LOCK FLUSH 100 UNIT/ML IV SOLN
500.0000 [IU] | Freq: Once | INTRAVENOUS | Status: AC | PRN
  Administered 2018-10-18: 500 [IU]
  Filled 2018-10-18: qty 5

## 2018-10-18 MED ORDER — DIPHENHYDRAMINE HCL 50 MG/ML IJ SOLN
INTRAMUSCULAR | Status: AC
  Filled 2018-10-18: qty 1

## 2018-10-18 MED ORDER — SODIUM CHLORIDE 0.9 % IV SOLN
80.0000 mg/m2 | Freq: Once | INTRAVENOUS | Status: AC
  Administered 2018-10-18: 168 mg via INTRAVENOUS
  Filled 2018-10-18: qty 28

## 2018-10-18 MED ORDER — FAMOTIDINE IN NACL 20-0.9 MG/50ML-% IV SOLN
INTRAVENOUS | Status: AC
  Filled 2018-10-18: qty 50

## 2018-10-18 MED FILL — CMPD MMW LID/MAALOX/DIPHEN: 12 days supply | Qty: 240 | Fill #0

## 2018-10-18 NOTE — Patient Instructions (Signed)
[facility] Discharge Instructions for Patients Receiving Chemotherapy  Today you received the following chemotherapy agents:  Taxol.  To help prevent nausea and vomiting after your treatment, we encourage you to take your nausea medication as directed.   If you develop nausea and vomiting that is not controlled by your nausea medication, call the clinic.   BELOW ARE SYMPTOMS THAT SHOULD BE REPORTED IMMEDIATELY:  *FEVER GREATER THAN 100.5 F  *CHILLS WITH OR WITHOUT FEVER  NAUSEA AND VOMITING THAT IS NOT CONTROLLED WITH YOUR NAUSEA MEDICATION  *UNUSUAL SHORTNESS OF BREATH  *UNUSUAL BRUISING OR BLEEDING  TENDERNESS IN MOUTH AND THROAT WITH OR WITHOUT PRESENCE OF ULCERS  *URINARY PROBLEMS  *BOWEL PROBLEMS  UNUSUAL RASH Items with * indicate a potential emergency and should be followed up as soon as possible.  Feel free to call the clinic should you have any questions or concerns. The clinic phone number is [phone].  Please show the CHEMO ALERT CARD at check-in to the Emergency Department and triage nurse.   

## 2018-10-18 NOTE — Telephone Encounter (Signed)
Make f/u regarding BP

## 2018-10-18 NOTE — Progress Notes (Signed)
Port flushed several times and pt repositioned with no blood return. Lab 5 will draw labs peripherally. Dr. Lindi Adie desk nurse called and will administer CATHFLOW.   LPN

## 2018-10-18 NOTE — Progress Notes (Signed)
Patient Care Team: Tysinger, Camelia Eng, PA-C as PCP - General (Family Medicine) Excell Seltzer, MD as Consulting Physician (General Surgery) Nicholas Lose, MD as Consulting Physician (Hematology and Oncology) Gery Pray, MD as Consulting Physician (Radiation Oncology)  DIAGNOSIS:  Encounter Diagnosis  Name Primary?  . Malignant neoplasm of upper-outer quadrant of right breast in female, estrogen receptor negative ([place])     SUMMARY OF ONCOLOGIC HISTORY:   Malignant neoplasm of upper-outer quadrant of right breast in female, estrogen receptor negative ([place])   06/23/2018 Initial Diagnosis    Palpable lump in the right breast for 2 months UOQ by mammogram and ultrasound cystic and solid lesion at 10 o'clock position measuring 4.1 cm, axilla negative, biopsy revealed 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 The Common Hereditary Cancers Panel offered by [facility] includes sequencing and/or deletion duplication testing of the following 47 genes: APC, ATM, AXIN2, BARD1, BMPR1A, BRCA1, BRCA2, BRIP1, CDH1, CDKN2A (p14ARF), CDKN2A (p16INK4a), CKD4, CHEK2, CTNNA1, DICER1, EPCAM (Deletion/duplication testing only), GREM1 (promoter region deletion/duplication testing only), KIT, MEN1, MLH1, MSH2, MSH3, MSH6, MUTYH, NBN, NF1, NHTL1, PALB2, PDGFRA, PMS2, POLD1, POLE, PTEN, RAD50, RAD51C, RAD51D, SDHB, SDHC, SDHD, SMAD4, SMARCA4. STK11, TP53, TSC1, TSC2, and VHL.  The following genes were evaluated for sequence changes only: SDHA and HOXB13 c.251G>A variant only.  The report date is 07/23/2018.     CHIEF COMPLIANT: Cycle 6 Taxol  INTERVAL HISTORY: Tara Rodriguez is a 47 year old with above-mentioned history of right breast cancer.  She is here to receive her  cycle 6 of Taxol.  She is tolerating chemo fairly well.  She does have chronic fatigue.  Denies any nausea vomiting.  Denies any neuropathy.  REVIEW OF SYSTEMS:   Constitutional: Denies fevers, chills or abnormal weight loss Eyes: Denies blurriness of vision Ears, nose, mouth, throat, and face: Denies mucositis or sore throat Respiratory: Denies cough, dyspnea or wheezes Cardiovascular: Denies palpitation, chest discomfort Gastrointestinal:  Denies nausea, heartburn or change in bowel habits Skin: Denies abnormal skin rashes Lymphatics: Denies new lymphadenopathy or easy bruising Neurological:Denies numbness, tingling or new weaknesses Behavioral/Psych: Mood is stable, no new changes  Extremities: No lower extremity edema Breast:  denies any pain or lumps or nodules in either breasts All other systems were reviewed with the patient and are negative.  I have reviewed the past medical history, past surgical history, social history and family history with the patient and they are unchanged from previous note.  ALLERGIES:  has No Known Allergies.  MEDICATIONS:  Current Outpatient Medications  Medication Sig Dispense Refill  . acetaminophen (TYLENOL) 500 MG tablet Take 500 mg by mouth every 6 (six) hours as needed for mild pain.     Marland Kitchen lidocaine-prilocaine (EMLA) cream Apply to affected area once 30 g 3  . LORazepam (ATIVAN) 0.5 MG tablet Take 1 tablet (0.5 mg total) by mouth at bedtime as needed for sleep. 30 tablet 0  . naproxen sodium (ALEVE) 220 MG tablet Take 220 mg by mouth as needed (pain).     . ondansetron (ZOFRAN) 8 MG tablet Take 1 tablet (8 mg total) by mouth 2 (two) times daily as needed. Start on the third day after chemotherapy. 30 tablet 1  .  prochlorperazine (COMPAZINE) 10 MG tablet Take 1 tablet (10 mg total) by mouth every 6 (six) hours as needed (Nausea or vomiting). 30 tablet 1  . VITAMIN E PO Take by mouth daily.      No current facility-administered medications for  this visit.     PHYSICAL EXAMINATION: ECOG PERFORMANCE STATUS: 1 - Symptomatic but completely ambulatory  Vitals:   10/18/18 1034  BP: (!) 138/91  Pulse: 96  Resp: 16  Temp: 98.4 F (36.9 C)  SpO2: 100%   Filed Weights   10/18/18 1034  Weight: 211 lb 12.8 oz (96.1 kg)    GENERAL:alert, no distress and comfortable SKIN: skin color, texture, turgor are normal, no rashes or significant lesions EYES: normal, Conjunctiva are pink and non-injected, sclera clear OROPHARYNX:no exudate, no erythema and lips, buccal mucosa, and tongue normal  NECK: supple, thyroid normal size, non-tender, without nodularity LYMPH:  no palpable lymphadenopathy in the cervical, axillary or inguinal LUNGS: clear to auscultation and percussion with normal breathing effort HEART: regular rate & rhythm and no murmurs and no lower extremity edema ABDOMEN:abdomen soft, non-tender and normal bowel sounds MUSCULOSKELETAL:no cyanosis of digits and no clubbing  NEURO: alert & oriented x 3 with fluent speech, no focal motor/sensory deficits EXTREMITIES: No lower extremity edema   LABORATORY DATA:  I have reviewed the data as listed CMP Latest Ref Rng & Units 10/11/2018 10/04/2018 09/27/2018  Glucose 70 - 99 mg/dL 140(H) 173(H) 113(H)  BUN 6 - 20 mg/dL '9 6 7  '$ Creatinine 0.44 - 1.00 mg/dL 0.73 0.75 0.71  Sodium 135 - 145 mmol/L 143 141 142  Potassium 3.5 - 5.1 mmol/L 3.5 3.6 3.4(L)  Chloride 98 - 111 mmol/L 110 109 110  CO2 22 - 32 mmol/L '25 25 25  '$ Calcium 8.9 - 10.3 mg/dL 8.6(L) 8.8(L) 8.5(L)  Total Protein 6.5 - 8.1 g/dL 6.6 6.7 6.5  Total Bilirubin 0.3 - 1.2 mg/dL 0.3 0.3 <0.2(L)  Alkaline Phos 38 - 126 U/L 79 72 65  AST 15 - 41 U/L '21 26 21  '$ ALT 0 - 44 U/L 36 52(H) 37    Lab Results  Component Value Date   WBC 3.8 (L) 10/18/2018   HGB 9.6 (L) 10/18/2018   HCT 29.5 (L) 10/18/2018   MCV 101.0 (H) 10/18/2018   PLT 267 10/18/2018   NEUTROABS 1.6 (L) 10/18/2018    ASSESSMENT & PLAN:  Malignant  neoplasm of upper-outer quadrant of right breast in female, estrogen receptor negative ([facility]) 06/23/2018:Palpable lump in the right breast for 2 months UOQ by mammogram and ultrasound cystic and solid lesion at 10 o'clock position measuring 4.1 cm, axilla negative, 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 Neoadjuvant chemotherapy with Adriamycin and Cytoxan dose dense 4 followed byTaxol and carboplatinweekly 12 2. Followed by breast conserving surgery with sentinel lymph node study  3. Followed by adjuvant radiation therapy ------------------------------------------------------------------------------ Current treatment:Completed 4 cycles ofdense Adriamycin and Cytoxan,today is cycle6Taxol (carbois every 3 weeks)  Labs have been reviewed  Chemo toxicities: 1. Elevated LFTs: Mild elevation of ALT 2. Chemotherapy-induced anemia: Monitoring closelytoday's hemoglobin is 9.6 3.  Elevated blood sugars: Discussed with her about cutting down on carbohydrate intake.  Monitoring closely for toxicities Return to clinic weekly for Taxol and every 2 weeks for follow-up withme    No orders of the defined types were placed in this encounter.  The patient has a good  understanding of the overall plan. she agrees with it. she will call with any problems that may develop before the next visit here.   Harriette Ohara, MD 10/18/18

## 2018-10-18 NOTE — Assessment & Plan Note (Signed)
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 Neoadjuvant chemotherapy with Adriamycin and Cytoxan dose dense 4 followed byTaxol and carboplatinweekly 12 2. Followed by breast conserving surgery with sentinel lymph node study  3. Followed by adjuvant radiation therapy ------------------------------------------------------------------------------ Current treatment:Completed 4 cycles ofdense Adriamycin and Cytoxan,today is cycle6Taxol (carbois every 3 weeks)  Labs have been reviewed  Chemo toxicities: 1. Elevated LFTs: Mild elevation of ALT 2. Chemotherapy-induced anemia: Monitoring closelytoday's hemoglobin is 9.3 3.  Elevated blood sugars: Discussed with her about cutting down on carbohydrate intake.  Return to clinic weekly for Taxol and every 2 weeks for follow-up withme

## 2018-10-19 NOTE — Telephone Encounter (Signed)
lmom asking patient to call the office to schedule a follow up appt for blood pressure

## 2018-10-25 ENCOUNTER — Inpatient Hospital Stay: Payer: 59

## 2018-10-25 VITALS — BP 148/89 | HR 94 | Temp 98.3°F | Resp 16

## 2018-10-25 DIAGNOSIS — Z171 Estrogen receptor negative status [ER-]: Secondary | ICD-10-CM

## 2018-10-25 DIAGNOSIS — C50411 Malignant neoplasm of upper-outer quadrant of right female breast: Secondary | ICD-10-CM

## 2018-10-25 DIAGNOSIS — Z5111 Encounter for antineoplastic chemotherapy: Secondary | ICD-10-CM | POA: Diagnosis not present

## 2018-10-25 DIAGNOSIS — Z95828 Presence of other vascular implants and grafts: Secondary | ICD-10-CM

## 2018-10-25 LAB — CBC WITH DIFFERENTIAL (CANCER CENTER ONLY)
ABS IMMATURE GRANULOCYTES: 0.01 10*3/uL (ref 0.00–0.07)
BASOS ABS: 0 10*3/uL (ref 0.0–0.1)
Basophils Relative: 1 %
EOS ABS: 0 10*3/uL (ref 0.0–0.5)
Eosinophils Relative: 0 %
HEMATOCRIT: 28.5 % — AB (ref 36.0–46.0)
Hemoglobin: 9.5 g/dL — ABNORMAL LOW (ref 12.0–15.0)
IMMATURE GRANULOCYTES: 0 %
LYMPHS ABS: 1.8 10*3/uL (ref 0.7–4.0)
Lymphocytes Relative: 47 %
MCH: 33.5 pg (ref 26.0–34.0)
MCHC: 33.3 g/dL (ref 30.0–36.0)
MCV: 100.4 fL — ABNORMAL HIGH (ref 80.0–100.0)
MONOS PCT: 11 %
Monocytes Absolute: 0.4 10*3/uL (ref 0.1–1.0)
NEUTROS ABS: 1.6 10*3/uL — AB (ref 1.7–7.7)
NEUTROS PCT: 41 %
NRBC: 0.5 % — AB (ref 0.0–0.2)
PLATELETS: 177 10*3/uL (ref 150–400)
RBC: 2.84 MIL/uL — ABNORMAL LOW (ref 3.87–5.11)
RDW: 19 % — AB (ref 11.5–15.5)
WBC Count: 3.9 10*3/uL — ABNORMAL LOW (ref 4.0–10.5)

## 2018-10-25 LAB — CMP (CANCER CENTER ONLY)
ALBUMIN: 3.7 g/dL (ref 3.5–5.0)
ALT: 45 U/L — ABNORMAL HIGH (ref 0–44)
ANION GAP: 8 (ref 5–15)
AST: 25 U/L (ref 15–41)
Alkaline Phosphatase: 71 U/L (ref 38–126)
BUN: 7 mg/dL (ref 6–20)
CHLORIDE: 109 mmol/L (ref 98–111)
CO2: 25 mmol/L (ref 22–32)
Calcium: 8.8 mg/dL — ABNORMAL LOW (ref 8.9–10.3)
Creatinine: 0.74 mg/dL (ref 0.44–1.00)
GFR, Estimated: >60 mL/min (ref >60)
GLUCOSE: 119 mg/dL — AB (ref 70–99)
POTASSIUM: 3.8 mmol/L (ref 3.5–5.1)
SODIUM: 142 mmol/L (ref 135–145)
Total Bilirubin: 0.3 mg/dL (ref 0.3–1.2)
Total Protein: 6.8 g/dL (ref 6.5–8.1)

## 2018-10-25 MED ORDER — HEPARIN SOD (PORK) LOCK FLUSH 100 UNIT/ML IV SOLN
500.0000 [IU] | Freq: Once | INTRAVENOUS | Status: AC | PRN
  Administered 2018-10-25: 500 [IU]
  Filled 2018-10-25: qty 5

## 2018-10-25 MED ORDER — PALONOSETRON HCL INJECTION 0.25 MG/5ML
INTRAVENOUS | Status: AC
  Filled 2018-10-25: qty 5

## 2018-10-25 MED ORDER — SODIUM CHLORIDE 0.9% FLUSH
10.0000 mL | INTRAVENOUS | Status: DC | PRN
  Administered 2018-10-25: 10 mL
  Filled 2018-10-25: qty 10

## 2018-10-25 MED ORDER — SODIUM CHLORIDE 0.9 % IV SOLN
80.0000 mg/m2 | Freq: Once | INTRAVENOUS | Status: AC
  Administered 2018-10-25: 168 mg via INTRAVENOUS
  Filled 2018-10-25: qty 28

## 2018-10-25 MED ORDER — DIPHENHYDRAMINE HCL 50 MG/ML IJ SOLN
INTRAMUSCULAR | Status: AC
  Filled 2018-10-25: qty 1

## 2018-10-25 MED ORDER — DIPHENHYDRAMINE HCL 50 MG/ML IJ SOLN
25.0000 mg | Freq: Once | INTRAMUSCULAR | Status: AC
  Administered 2018-10-25: 25 mg via INTRAVENOUS

## 2018-10-25 MED ORDER — FAMOTIDINE IN NACL 20-0.9 MG/50ML-% IV SOLN
20.0000 mg | Freq: Once | INTRAVENOUS | Status: AC
  Administered 2018-10-25: 20 mg via INTRAVENOUS

## 2018-10-25 MED ORDER — PALONOSETRON HCL INJECTION 0.25 MG/5ML
0.2500 mg | Freq: Once | INTRAVENOUS | Status: AC
  Administered 2018-10-25: 0.25 mg via INTRAVENOUS

## 2018-10-25 MED ORDER — SODIUM CHLORIDE 0.9 % IV SOLN
Freq: Once | INTRAVENOUS | Status: AC
  Administered 2018-10-25: 11:00:00 via INTRAVENOUS
  Filled 2018-10-25: qty 5

## 2018-10-25 MED ORDER — FAMOTIDINE IN NACL 20-0.9 MG/50ML-% IV SOLN
INTRAVENOUS | Status: AC
  Filled 2018-10-25: qty 50

## 2018-10-25 MED ORDER — SODIUM CHLORIDE 0.9 % IV SOLN
700.0000 mg | Freq: Once | INTRAVENOUS | Status: AC
  Administered 2018-10-25: 700 mg via INTRAVENOUS
  Filled 2018-10-25: qty 70

## 2018-10-25 MED ORDER — SODIUM CHLORIDE 0.9 % IV SOLN
Freq: Once | INTRAVENOUS | Status: AC
  Administered 2018-10-25: 11:00:00 via INTRAVENOUS
  Filled 2018-10-25: qty 250

## 2018-10-25 NOTE — Patient Instructions (Signed)
Implanted Port Home Guide An implanted port is a type of central line that is placed under the skin. Central lines are used to provide IV access when treatment or nutrition needs to be given through a person's veins. Implanted ports are used for long-term IV access. An implanted port may be placed because:  You need IV medicine that would be irritating to the small veins in your hands or arms.  You need long-term IV medicines, such as antibiotics.  You need IV nutrition for a long period.  You need frequent blood draws for lab tests.  You need dialysis.  Implanted ports are usually placed in the chest area, but they can also be placed in the upper arm, the abdomen, or the leg. An implanted port has two main parts:  Reservoir. The reservoir is round and will appear as a small, raised area under your skin. The reservoir is the part where a needle is inserted to give medicines or draw blood.  Catheter. The catheter is a thin, flexible tube that extends from the reservoir. The catheter is placed into a large vein. Medicine that is inserted into the reservoir goes into the catheter and then into the vein.  How will I care for my incision site? Do not get the incision site wet. Bathe or shower as directed by your health care provider. How is my port accessed? Special steps must be taken to access the port:  Before the port is accessed, a numbing cream can be placed on the skin. This helps numb the skin over the port site.  Your health care provider uses a sterile technique to access the port. ? Your health care provider must put on a mask and sterile gloves. ? The skin over your port is cleaned carefully with an antiseptic and allowed to dry. ? The port is gently pinched between sterile gloves, and a needle is inserted into the port.  Only "non-coring" port needles should be used to access the port. Once the port is accessed, a blood return should be checked. This helps ensure that the port  is in the vein and is not clogged.  If your port needs to remain accessed for a constant infusion, a clear (transparent) bandage will be placed over the needle site. The bandage and needle will need to be changed every week, or as directed by your health care provider.  Keep the bandage covering the needle clean and dry. Do not get it wet. Follow your health care provider's instructions on how to take a shower or bath while the port is accessed.  If your port does not need to stay accessed, no bandage is needed over the port.  What is flushing? Flushing helps keep the port from getting clogged. Follow your health care provider's instructions on how and when to flush the port. Ports are usually flushed with saline solution or a medicine called heparin. The need for flushing will depend on how the port is used.  If the port is used for intermittent medicines or blood draws, the port will need to be flushed: ? After medicines have been given. ? After blood has been drawn. ? As part of routine maintenance.  If a constant infusion is running, the port may not need to be flushed.  How long will my port stay implanted? The port can stay in for as long as your health care provider thinks it is needed. When it is time for the port to come out, surgery will be   done to remove it. The procedure is similar to the one performed when the port was put in. When should I seek immediate medical care? When you have an implanted port, you should seek immediate medical care if:  You notice a bad smell coming from the incision site.  You have swelling, redness, or drainage at the incision site.  You have more swelling or pain at the port site or the surrounding area.  You have a fever that is not controlled with medicine.  This information is not intended to replace advice given to you by your health care provider. Make sure you discuss any questions you have with your health care provider. Document  Released: 11/17/2005 Document Revised: 04/24/2016 Document Reviewed: 07/25/2013 [employer] Interactive Patient Education  2017 [employer]  

## 2018-10-25 NOTE — Patient Instructions (Signed)
[facility] Discharge Instructions for Patients Receiving Chemotherapy  Today you received the following chemotherapy agents paclitaxel (Taxol) and carboplatin (Paraplatin)  To help prevent nausea and vomiting after your treatment, we encourage you to take your nausea medication as directed   If you develop nausea and vomiting that is not controlled by your nausea medication, call the clinic.   BELOW ARE SYMPTOMS THAT SHOULD BE REPORTED IMMEDIATELY:  *FEVER GREATER THAN 100.5 F  *CHILLS WITH OR WITHOUT FEVER  NAUSEA AND VOMITING THAT IS NOT CONTROLLED WITH YOUR NAUSEA MEDICATION  *UNUSUAL SHORTNESS OF BREATH  *UNUSUAL BRUISING OR BLEEDING  TENDERNESS IN MOUTH AND THROAT WITH OR WITHOUT PRESENCE OF ULCERS  *URINARY PROBLEMS  *BOWEL PROBLEMS  UNUSUAL RASH Items with * indicate a potential emergency and should be followed up as soon as possible.  Feel free to call the clinic should you have any questions or concerns. The clinic phone number is [phone].  Please show the CHEMO ALERT CARD at check-in to the Emergency Department and triage nurse.   

## 2018-11-01 ENCOUNTER — Inpatient Hospital Stay (HOSPITAL_BASED_OUTPATIENT_CLINIC_OR_DEPARTMENT_OTHER): Payer: 59 | Admitting: Adult Health

## 2018-11-01 ENCOUNTER — Telehealth: Payer: Self-pay | Admitting: Adult Health

## 2018-11-01 ENCOUNTER — Inpatient Hospital Stay: Payer: 59 | Attending: Genetic Counselor

## 2018-11-01 ENCOUNTER — Inpatient Hospital Stay: Payer: 59

## 2018-11-01 ENCOUNTER — Encounter: Payer: Self-pay | Admitting: Adult Health

## 2018-11-01 VITALS — HR 97 | Resp 14

## 2018-11-01 VITALS — BP 139/97 | HR 102 | Temp 98.4°F | Resp 18 | Ht 69.0 in | Wt 210.7 lb

## 2018-11-01 DIAGNOSIS — R7989 Other specified abnormal findings of blood chemistry: Secondary | ICD-10-CM | POA: Diagnosis not present

## 2018-11-01 DIAGNOSIS — Z5111 Encounter for antineoplastic chemotherapy: Secondary | ICD-10-CM | POA: Diagnosis not present

## 2018-11-01 DIAGNOSIS — D701 Agranulocytosis secondary to cancer chemotherapy: Secondary | ICD-10-CM | POA: Insufficient documentation

## 2018-11-01 DIAGNOSIS — T451X5A Adverse effect of antineoplastic and immunosuppressive drugs, initial encounter: Secondary | ICD-10-CM | POA: Insufficient documentation

## 2018-11-01 DIAGNOSIS — C50411 Malignant neoplasm of upper-outer quadrant of right female breast: Secondary | ICD-10-CM

## 2018-11-01 DIAGNOSIS — Z79899 Other long term (current) drug therapy: Secondary | ICD-10-CM

## 2018-11-01 DIAGNOSIS — Z171 Estrogen receptor negative status [ER-]: Secondary | ICD-10-CM | POA: Diagnosis not present

## 2018-11-01 DIAGNOSIS — Z95828 Presence of other vascular implants and grafts: Secondary | ICD-10-CM

## 2018-11-01 DIAGNOSIS — R739 Hyperglycemia, unspecified: Secondary | ICD-10-CM | POA: Diagnosis not present

## 2018-11-01 DIAGNOSIS — G620 Drug-induced polyneuropathy: Secondary | ICD-10-CM | POA: Insufficient documentation

## 2018-11-01 LAB — CBC WITH DIFFERENTIAL (CANCER CENTER ONLY)
ABS IMMATURE GRANULOCYTES: 0.01 10*3/uL (ref 0.00–0.07)
BASOS PCT: 1 %
Basophils Absolute: 0 10*3/uL (ref 0.0–0.1)
Eosinophils Absolute: 0 10*3/uL (ref 0.0–0.5)
Eosinophils Relative: 0 %
HCT: 26.3 % — ABNORMAL LOW (ref 36.0–46.0)
Hemoglobin: 8.7 g/dL — ABNORMAL LOW (ref 12.0–15.0)
IMMATURE GRANULOCYTES: 0 %
LYMPHS PCT: 45 %
Lymphs Abs: 1.5 10*3/uL (ref 0.7–4.0)
MCH: 33.3 pg (ref 26.0–34.0)
MCHC: 33.1 g/dL (ref 30.0–36.0)
MCV: 100.8 fL — AB (ref 80.0–100.0)
Monocytes Absolute: 0.3 10*3/uL (ref 0.1–1.0)
Monocytes Relative: 8 %
NEUTROS ABS: 1.6 10*3/uL — AB (ref 1.7–7.7)
NEUTROS PCT: 46 %
Platelet Count: 155 10*3/uL (ref 150–400)
RBC: 2.61 MIL/uL — AB (ref 3.87–5.11)
RDW: 17 % — ABNORMAL HIGH (ref 11.5–15.5)
WBC Count: 3.5 10*3/uL — ABNORMAL LOW (ref 4.0–10.5)
nRBC: 0 % (ref 0.0–0.2)

## 2018-11-01 LAB — CMP (CANCER CENTER ONLY)
ALT: 20 U/L (ref 0–44)
ANION GAP: 9 (ref 5–15)
AST: 16 U/L (ref 15–41)
Albumin: 3.5 g/dL (ref 3.5–5.0)
Alkaline Phosphatase: 63 U/L (ref 38–126)
BILIRUBIN TOTAL: 0.3 mg/dL (ref 0.3–1.2)
BUN: 8 mg/dL (ref 6–20)
CHLORIDE: 109 mmol/L (ref 98–111)
CO2: 24 mmol/L (ref 22–32)
Calcium: 8.5 mg/dL — ABNORMAL LOW (ref 8.9–10.3)
Creatinine: 0.75 mg/dL (ref 0.44–1.00)
GFR, Est AFR Am: >60 mL/min (ref >60)
GFR, Estimated: >60 mL/min (ref >60)
GLUCOSE: 141 mg/dL — AB (ref 70–99)
POTASSIUM: 3.6 mmol/L (ref 3.5–5.1)
Sodium: 142 mmol/L (ref 135–145)
TOTAL PROTEIN: 6.2 g/dL — AB (ref 6.5–8.1)

## 2018-11-01 MED ORDER — DIPHENHYDRAMINE HCL 50 MG/ML IJ SOLN
25.0000 mg | Freq: Once | INTRAMUSCULAR | Status: AC
  Administered 2018-11-01: 25 mg via INTRAVENOUS

## 2018-11-01 MED ORDER — FAMOTIDINE IN NACL 20-0.9 MG/50ML-% IV SOLN
INTRAVENOUS | Status: AC
  Filled 2018-11-01: qty 50

## 2018-11-01 MED ORDER — DEXAMETHASONE SODIUM PHOSPHATE 10 MG/ML IJ SOLN
10.0000 mg | Freq: Once | INTRAMUSCULAR | Status: AC
  Administered 2018-11-01: 10 mg via INTRAVENOUS

## 2018-11-01 MED ORDER — SODIUM CHLORIDE 0.9% FLUSH
10.0000 mL | INTRAVENOUS | Status: DC | PRN
  Administered 2018-11-01: 10 mL
  Filled 2018-11-01: qty 10

## 2018-11-01 MED ORDER — FAMOTIDINE IN NACL 20-0.9 MG/50ML-% IV SOLN
20.0000 mg | Freq: Once | INTRAVENOUS | Status: AC
  Administered 2018-11-01: 20 mg via INTRAVENOUS

## 2018-11-01 MED ORDER — DEXAMETHASONE SODIUM PHOSPHATE 10 MG/ML IJ SOLN
INTRAMUSCULAR | Status: AC
  Filled 2018-11-01: qty 1

## 2018-11-01 MED ORDER — HEPARIN SOD (PORK) LOCK FLUSH 100 UNIT/ML IV SOLN
500.0000 [IU] | Freq: Once | INTRAVENOUS | Status: AC | PRN
  Administered 2018-11-01: 500 [IU]
  Filled 2018-11-01: qty 5

## 2018-11-01 MED ORDER — SODIUM CHLORIDE 0.9 % IV SOLN
80.0000 mg/m2 | Freq: Once | INTRAVENOUS | Status: AC
  Administered 2018-11-01: 168 mg via INTRAVENOUS
  Filled 2018-11-01: qty 28

## 2018-11-01 MED ORDER — SODIUM CHLORIDE 0.9 % IV SOLN
Freq: Once | INTRAVENOUS | Status: AC
  Administered 2018-11-01: 14:00:00 via INTRAVENOUS
  Filled 2018-11-01: qty 250

## 2018-11-01 MED ORDER — DIPHENHYDRAMINE HCL 50 MG/ML IJ SOLN
INTRAMUSCULAR | Status: AC
  Filled 2018-11-01: qty 1

## 2018-11-01 NOTE — Progress Notes (Signed)
[facility] Follow up:    Tara Hurl, PA-C [place] [address]   DIAGNOSIS: Cancer Staging Malignant neoplasm of upper-outer quadrant of right breast in female, estrogen receptor negative ([place]) Staging form: Breast, AJCC 8th Edition - Clinical: No stage assigned - Unsigned - Clinical stage from 06/30/2018: Stage IIB (cT2, cN0, cM0, G3, ER-, PR-, HER2-) - Unsigned   SUMMARY OF ONCOLOGIC HISTORY:   Malignant neoplasm of upper-outer quadrant of right breast in female, estrogen receptor negative ([place])   06/23/2018 Initial Diagnosis    Palpable lump in the right breast for 2 months UOQ by mammogram and ultrasound cystic and solid lesion at 10 o'clock position measuring 4.1 cm, axilla negative, biopsy revealed 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 The Common Hereditary Cancers Panel offered by [facility] includes sequencing and/or deletion duplication testing of the following 47 genes: APC, ATM, AXIN2, BARD1, BMPR1A, BRCA1, BRCA2, BRIP1, CDH1, CDKN2A (p14ARF), CDKN2A (p16INK4a), CKD4, CHEK2, CTNNA1, DICER1, EPCAM (Deletion/duplication testing only), GREM1 (promoter region deletion/duplication testing only), KIT, MEN1, MLH1, MSH2, MSH3, MSH6, MUTYH, NBN, NF1, NHTL1, PALB2, PDGFRA, PMS2, POLD1, POLE, PTEN, RAD50, RAD51C, RAD51D, SDHB, SDHC, SDHD, SMAD4, SMARCA4. STK11, TP53, TSC1, TSC2, and VHL.  The following genes were evaluated for sequence changes only: SDHA and HOXB13 c.251G>A variant only.  The report date is 07/23/2018.     CURRENT THERAPY: Taxol/Carbo  INTERVAL HISTORY: Tara Rodriguez 47 y.o. female returns for evaluation prior to receiving neoadjuvant chemotherapy with Taxol and Carboplatin.  She  receives the carboplatin every 3 weeks and the Taxol weekly.  She has had increasing leg pain and increased fatigue.  She notes that she takes tylenol for this pain and it isn't helping.  Heat does help some.  She has decreased levels of her normal activity.  She is sitting more throughout the day.  She has an occasional floater in her vision.  But denies headaches, focal weakness, numbness or any other concerns.  She denies peripheral neuropathy.  She notes that the place in her breast that she was initially able to feel, she can no longer feel.    Patient Active Problem List   Diagnosis Date Noted  . Genetic testing 07/26/2018  . Port-A-Cath in place 07/12/2018  . Family history of breast cancer   . Family history of colon cancer   . Malignant neoplasm of upper-outer quadrant of right breast in female, estrogen receptor negative ([place]) 06/29/2018  . Numbness and tingling 06/21/2018  . Breast lump 06/21/2018  . Encounter for health maintenance examination in adult 10/15/2015  . Screening for breast cancer 10/15/2015  . Insomnia 10/15/2015  . Impaired fasting blood sugar 10/15/2015  . Low HDL (under 40) 10/15/2015  . Obesity 10/15/2015  . Perimenopausal 10/15/2015  . Cutaneous skin tags 10/15/2015  . Changing skin lesion 10/15/2015  . Dental plaque 10/15/2015    has No Known Allergies.  MEDICAL HISTORY: Past Medical History:  Diagnosis Date  . Acrochordon    skin, left lower breast  . Allergy   . Anemia    history of  . Blood transfusion without reported diagnosis    during hysterectomy  . Family history of breast  cancer   . Family history of colon cancer   . Migraine   . S/P hysterectomy    hx/o fibroids  . Wears glasses     SURGICAL HISTORY: Past Surgical History:  Procedure Laterality Date  . ABDOMINAL HYSTERECTOMY  2009   total, due to fibroids  . COLONOSCOPY  2009   due to anemia  . PORTACATH PLACEMENT N/A 07/05/2018   Procedure: INSERTION PORT-A-CATH;  Surgeon:  Excell Seltzer, MD;  Location: [facility];  Service: General;  Laterality: N/A;    SOCIAL HISTORY: Social History   Socioeconomic History  . Marital status: Single    Spouse name: Not on file  . Number of children: Not on file  . Years of education: Not on file  . Highest education level: Not on file  Occupational History  . Not on file  Social Needs  . Financial resource strain: Not on file  . Food insecurity:    Worry: Not on file    Inability: Not on file  . Transportation needs:    Medical: Not on file    Non-medical: Not on file  Tobacco Use  . Smoking status: Never Smoker  . Smokeless tobacco: Never Used  Substance and Sexual Activity  . Alcohol use: No  . Drug use: No  . Sexual activity: Not on file  Lifestyle  . Physical activity:    Days per week: Not on file    Minutes per session: Not on file  . Stress: Not on file  Relationships  . Social connections:    Talks on phone: Not on file    Gets together: Not on file    Attends religious service: Not on file    Active member of club or organization: Not on file    Attends meetings of clubs or organizations: Not on file    Relationship status: Not on file  . Intimate partner violence:    Fear of current or ex partner: Not on file    Emotionally abused: Not on file    Physically abused: Not on file    Forced sexual activity: Not on file  Other Topics Concern  . 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   05/2018    FAMILY HISTORY: Family History  Problem Relation Age of Onset  . Heart disease Mother        pacemaker  . Diabetes Mother   . Arthritis Mother   . Alcohol abuse Father   . Breast cancer Sister        28s  . Breast cancer Maternal Grandmother        dx 70, dx 81s  . Stroke Maternal Grandfather   . Colon cancer Paternal Grandmother   . Uterine cancer Paternal Grandmother    . Cancer Paternal Grandmother        unknown 3rd cancer  . Hypertension Neg Hx   . Hyperlipidemia Neg Hx     Review of Systems  Constitutional: Positive for fatigue. Negative for appetite change, chills and fever.  HENT:   Negative for hearing loss, lump/mass and trouble swallowing.   Eyes: Negative for icterus.  Respiratory: Negative for chest tightness, cough and shortness of breath.   Cardiovascular: Negative for chest pain, leg swelling and palpitations.  Gastrointestinal: Negative for abdominal distention, abdominal pain, constipation, diarrhea, nausea and vomiting.  Endocrine: Negative for hot flashes.  Musculoskeletal: Negative  for arthralgias.  Skin: Negative for itching and rash.  Neurological: Negative for dizziness, extremity weakness, headaches and numbness.  Hematological: Negative for adenopathy. Does not bruise/bleed easily.  Psychiatric/Behavioral: Negative for depression. The patient is not nervous/anxious.       PHYSICAL EXAMINATION  ECOG PERFORMANCE STATUS: 1 - Symptomatic but completely ambulatory  Vitals:   11/01/18 1309  BP: (!) 139/97  Pulse: (!) 102  Resp: 18  Temp: 98.4 F (36.9 C)  SpO2: 100%    Physical Exam  Constitutional: She is oriented to person, place, and time. She appears well-developed and well-nourished.  HENT:  Head: Normocephalic and atraumatic.  Mouth/Throat: Oropharynx is clear and moist. No oropharyngeal exudate.  Eyes: Pupils are equal, round, and reactive to light. No scleral icterus.  Neck: Neck supple.  Cardiovascular: Normal rate, regular rhythm and normal heart sounds.  Pulmonary/Chest: Effort normal and breath sounds normal.  Unable to palpate right breast mass  Abdominal: Soft. Bowel sounds are normal.  Musculoskeletal: She exhibits no edema.  Lymphadenopathy:    She has no cervical adenopathy.  Neurological: She is alert and oriented to person, place, and time.  Skin: Skin is warm and dry. Capillary refill takes  less than 2 seconds.  Psychiatric: She has a normal mood and affect.    LABORATORY DATA:  CBC    Component Value Date/Time   WBC 3.5 (L) 11/01/2018 1234   WBC 5.6 08/03/2018 0808   RBC 2.61 (L) 11/01/2018 1234   HGB 8.7 (L) 11/01/2018 1234   HGB 13.7 06/21/2018 1014   HCT 26.3 (L) 11/01/2018 1234   HCT 42.7 06/21/2018 1014   PLT 155 11/01/2018 1234   PLT 332 06/21/2018 1014   MCV 100.8 (H) 11/01/2018 1234   MCV 90 06/21/2018 1014   MCH 33.3 11/01/2018 1234   MCHC 33.1 11/01/2018 1234   RDW 17.0 (H) 11/01/2018 1234   RDW 14.9 06/21/2018 1014   LYMPHSABS 1.5 11/01/2018 1234   MONOABS 0.3 11/01/2018 1234   EOSABS 0.0 11/01/2018 1234   BASOSABS 0.0 11/01/2018 1234    CMP     Component Value Date/Time   NA 142 10/25/2018 0939   NA 141 06/21/2018 1014   K 3.8 10/25/2018 0939   CL 109 10/25/2018 0939   CO2 25 10/25/2018 0939   GLUCOSE 119 (H) 10/25/2018 0939   BUN 7 10/25/2018 0939   BUN 9 06/21/2018 1014   CREATININE 0.74 10/25/2018 0939   CREATININE 0.75 10/15/2015 0001   CALCIUM 8.8 (L) 10/25/2018 0939   PROT 6.8 10/25/2018 0939   PROT 7.0 06/21/2018 1014   ALBUMIN 3.7 10/25/2018 0939   ALBUMIN 4.7 06/21/2018 1014   AST 25 10/25/2018 0939   ALT 45 (H) 10/25/2018 0939   ALKPHOS 71 10/25/2018 0939   BILITOT 0.3 10/25/2018 0939   GFRNONAA >60 10/25/2018 0939   GFRAA >60 10/25/2018 0939           ASSESSMENT and PLAN:   Malignant neoplasm of upper-outer quadrant of right breast in female, estrogen receptor negative ([place]) 06/23/2018:Palpable lump in the right breast for 2 months UOQ by mammogram and ultrasound cystic and solid lesion at 10 o'clock position measuring 4.1 cm, axilla negative, 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 Neoadjuvant chemotherapy with Adriamycin and Cytoxan dose dense 4 followed byTaxol and carboplatinweekly 12 2.  Followed by breast conserving surgery with sentinel lymph node study  3. Followed by adjuvant radiation therapy ------------------------------------------------------------------------------ Current treatment:Completed 4 cycles ofdense Adriamycin and Cytoxan,today is cycle8Taxol (carbois every 3 weeks)  Labs have been reviewed  Chemo toxicities: 1. Elevated LFTs: monitoring 2. Chemotherapy-induced anemia: Monitoring closelytoday's hemoglobin is 8.7 3.  Elevated blood sugars: limiting carbs has been discussed.  She is tolerating chemotherapy well.  Reviewed tips to help alleviate fatigue.  I requested the rest of her chemo be scheduled, and ordered her post neoadjuvant chemotherapy MRI.  She knows to let us know if she develops any peripheral neuropathy.     Orders Placed This Encounter  Procedures  . MR BREAST BILATERAL W WO CONTRAST INC CAD    Standing Status:   Future    Standing Expiration Date:   01/03/2020    Order Specific Question:   If indicated for the ordered procedure, I authorize the administration of contrast media per Radiology protocol    Answer:   Yes    Order Specific Question:   What is the patient's sedation requirement?    Answer:   No Sedation    Order Specific Question:   Does the patient have a pacemaker or implanted devices?    Answer:   No    Order Specific Question:   Radiology Contrast Protocol - do NOT remove file path    Answer:   [email or web address]    Order Specific Question:   Preferred imaging location?    Answer:   [facility] (table limit-550lbs)    All questions were answered. The patient knows to call the clinic with any problems, questions or concerns. We can certainly see the patient much sooner if necessary.  A total of (20) minutes of face-to-face time was spent with this patient with greater than 50% of that time in counseling and care-coordination.  This note was electronically signed. Scot Dock, NP 11/01/2018

## 2018-11-01 NOTE — Patient Instructions (Signed)
[facility] Discharge Instructions for Patients Receiving Chemotherapy  Today you received the following chemotherapy agents:  Taxol.  To help prevent nausea and vomiting after your treatment, we encourage you to take your nausea medication as directed.   If you develop nausea and vomiting that is not controlled by your nausea medication, call the clinic.   BELOW ARE SYMPTOMS THAT SHOULD BE REPORTED IMMEDIATELY:  *FEVER GREATER THAN 100.5 F  *CHILLS WITH OR WITHOUT FEVER  NAUSEA AND VOMITING THAT IS NOT CONTROLLED WITH YOUR NAUSEA MEDICATION  *UNUSUAL SHORTNESS OF BREATH  *UNUSUAL BRUISING OR BLEEDING  TENDERNESS IN MOUTH AND THROAT WITH OR WITHOUT PRESENCE OF ULCERS  *URINARY PROBLEMS  *BOWEL PROBLEMS  UNUSUAL RASH Items with * indicate a potential emergency and should be followed up as soon as possible.  Feel free to call the clinic should you have any questions or concerns. The clinic phone number is [phone].  Please show the CHEMO ALERT CARD at check-in to the Emergency Department and triage nurse.   

## 2018-11-01 NOTE — Telephone Encounter (Signed)
Gave avs and calendar ° °

## 2018-11-01 NOTE — Assessment & Plan Note (Addendum)
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 Neoadjuvant chemotherapy with Adriamycin and Cytoxan dose dense 4 followed byTaxol and carboplatinweekly 12 2. Followed by breast conserving surgery with sentinel lymph node study  3. Followed by adjuvant radiation therapy ------------------------------------------------------------------------------ Current treatment:Completed 4 cycles ofdense Adriamycin and Cytoxan,today is cycle8Taxol (carbois every 3 weeks)  Labs have been reviewed  Chemo toxicities: 1. Elevated LFTs: monitoring 2. Chemotherapy-induced anemia: Monitoring closelytoday's hemoglobin is 8.7 3.  Elevated blood sugars: limiting carbs has been discussed.  She is tolerating chemotherapy well.  Reviewed tips to help alleviate fatigue.  I requested the rest of her chemo be scheduled, and ordered her post neoadjuvant chemotherapy MRI.  She knows to let us know if she develops any peripheral neuropathy.

## 2018-11-08 ENCOUNTER — Inpatient Hospital Stay: Payer: 59

## 2018-11-08 ENCOUNTER — Encounter: Payer: Self-pay | Admitting: Emergency Medicine

## 2018-11-08 VITALS — BP 113/79 | HR 99 | Temp 98.3°F | Resp 16

## 2018-11-08 DIAGNOSIS — Z95828 Presence of other vascular implants and grafts: Secondary | ICD-10-CM

## 2018-11-08 DIAGNOSIS — Z171 Estrogen receptor negative status [ER-]: Secondary | ICD-10-CM

## 2018-11-08 DIAGNOSIS — C50411 Malignant neoplasm of upper-outer quadrant of right female breast: Secondary | ICD-10-CM

## 2018-11-08 LAB — CMP (CANCER CENTER ONLY)
ALT: 37 U/L (ref 0–44)
AST: 23 U/L (ref 15–41)
Albumin: 3.5 g/dL (ref 3.5–5.0)
Alkaline Phosphatase: 60 U/L (ref 38–126)
Anion gap: 7 (ref 5–15)
BUN: 7 mg/dL (ref 6–20)
CO2: 25 mmol/L (ref 22–32)
Calcium: 8.6 mg/dL — ABNORMAL LOW (ref 8.9–10.3)
Chloride: 109 mmol/L (ref 98–111)
Creatinine: 0.71 mg/dL (ref 0.44–1.00)
GFR, Est AFR Am: >60 mL/min (ref >60)
GFR, Estimated: >60 mL/min (ref >60)
Glucose, Bld: 144 mg/dL — ABNORMAL HIGH (ref 70–99)
Potassium: 3.6 mmol/L (ref 3.5–5.1)
Sodium: 141 mmol/L (ref 135–145)
Total Bilirubin: 0.3 mg/dL (ref 0.3–1.2)
Total Protein: 6.2 g/dL — ABNORMAL LOW (ref 6.5–8.1)

## 2018-11-08 LAB — CBC WITH DIFFERENTIAL (CANCER CENTER ONLY)
Abs Immature Granulocytes: 0.01 10*3/uL (ref 0.00–0.07)
Basophils Absolute: 0 10*3/uL (ref 0.0–0.1)
Basophils Relative: 1 %
Eosinophils Absolute: 0 10*3/uL (ref 0.0–0.5)
Eosinophils Relative: 0 %
HCT: 26.8 % — ABNORMAL LOW (ref 36.0–46.0)
Hemoglobin: 8.7 g/dL — ABNORMAL LOW (ref 12.0–15.0)
Immature Granulocytes: 0 %
Lymphocytes Relative: 52 %
Lymphs Abs: 1.4 10*3/uL (ref 0.7–4.0)
MCH: 33.9 pg (ref 26.0–34.0)
MCHC: 32.5 g/dL (ref 30.0–36.0)
MCV: 104.3 fL — ABNORMAL HIGH (ref 80.0–100.0)
Monocytes Absolute: 0.4 10*3/uL (ref 0.1–1.0)
Monocytes Relative: 13 %
NEUTROS ABS: 0.9 10*3/uL — AB (ref 1.7–7.7)
NEUTROS PCT: 34 %
Platelet Count: 154 10*3/uL (ref 150–400)
RBC: 2.57 MIL/uL — AB (ref 3.87–5.11)
RDW: 16.8 % — ABNORMAL HIGH (ref 11.5–15.5)
WBC Count: 2.8 10*3/uL — ABNORMAL LOW (ref 4.0–10.5)
nRBC: 1.1 % — ABNORMAL HIGH (ref 0.0–0.2)

## 2018-11-08 MED ORDER — SODIUM CHLORIDE 0.9% FLUSH
10.0000 mL | INTRAVENOUS | Status: DC | PRN
  Administered 2018-11-08: 10 mL
  Filled 2018-11-08: qty 10

## 2018-11-08 MED ORDER — HEPARIN SOD (PORK) LOCK FLUSH 100 UNIT/ML IV SOLN
500.0000 [IU] | Freq: Once | INTRAVENOUS | Status: AC | PRN
  Administered 2018-11-08: 500 [IU]
  Filled 2018-11-08: qty 5

## 2018-11-08 NOTE — Progress Notes (Signed)
Per Dr. Lindi Adie - hold chemotherapy, Taxol, today due to [place] of 0.9.  Dose reduction for next infusion will be initiated for 11/15/2018.  Infusion nurse notified.

## 2018-11-08 NOTE — Progress Notes (Signed)
Patient is here for flush today. She is C/O numbness and tingling in her fingers and toes, fatigued and bilateral leg pain. Dr.Gudenas desk RN made aware.

## 2018-11-08 NOTE — Patient Instructions (Signed)
Neutropenia Neutropenia is a condition that occurs when you have a lower-than-normal level of a type of white blood cell (neutrophil) in your body. Neutrophils are made in the spongy center of large bones (bone marrow) and they fight infections. Neutrophils are your body's main defense against bacterial and fungal infections. The fewer neutrophils you have and the longer your body remains without them, the greater your risk of getting a severe infection. What are the causes? This condition can occur if your body uses up or destroys neutrophils faster than your bone marrow can make them. This problem may happen because of:  Bacterial or fungal infection.  Allergic disorders.  Reactions to some medicines.  Autoimmune disease.  An enlarged spleen.  This condition can also occur if your bone marrow does not produce enough neutrophils. This problem may be caused by:  Cancer.  Cancer treatments, such as radiation or chemotherapy.  Viral infections.  Medicines, such as phenytoin.  Vitamin B12 deficiency.  Diseases of the bone marrow.  Environmental toxins, such as insecticides.  What are the signs or symptoms? This condition does not usually cause symptoms. If symptoms are present, they are usually caused by an underlying infection. Symptoms of an infection may include:  Fever.  Chills.  Swollen glands.  Oral or anal ulcers.  Cough and shortness of breath.  Rash.  Skin infection.  Fatigue.  How is this diagnosed? Your health care provider may suspect neutropenia if you have:  A condition that may cause neutropenia.  Symptoms of infection, especially fever.  Frequent and unusual infections.  You will have a medical history and physical exam. Tests will also be done, such as:  A complete blood count (CBC).  A procedure to collect a sample of bone marrow for examination (bone marrow biopsy).  A chest X-ray.  A urine culture.  A blood culture.  How is this  treated? Treatment depends on the underlying cause and severity of your condition. Mild neutropenia may not require treatment. Treatment may include medicines, such as:  Antibiotic medicine given through an IV tube.  Antiviral medicines.  Antifungal medicines.  A medicine to increase neutrophil production (colony-stimulating factor). You may get this drug through an IV tube or by injection.  Steroids given through an IV tube.  If an underlying condition is causing neutropenia, you may need treatment for that condition. If medicines you are taking are causing neutropenia, your health care provider may have you stop taking those medicines. Follow these instructions at home: Medicines  Take over-the-counter and prescription medicines only as told by your health care provider.  Get a seasonal flu shot (influenza vaccine). Lifestyle  Do not eat unpasteurized foods.Do not eat unwashed raw fruits or vegetables.  Avoid exposure to groups of people or children.  Avoid being around people who are sick.  Avoid being around dirt or dust, such as in construction areas or gardens.  Do not provide direct care for pets. Avoid animal droppings. Do not clean litter boxes and bird cages. Hygiene   Bathe daily.  Clean the area between the genitals and the anus (perineal area) after you urinate or have a bowel movement. If you are female, wipe from front to back.  Brush your teeth with a soft toothbrush before and after meals.  Do not use a razor that has a blade. Use an electric razor to remove hair.  Wash your hands often. Make sure others who come in contact with you also wash their hands. If soap and water  are not available, use hand sanitizer. General instructions  Do not have sex unless your health care provider has approved.  Take actions to avoid cuts and burns. For example: ? Be cautious when you use knives. Always cut away from yourself. ? Keep knives in protective sheaths or  guards when not in use. ? Use oven mitts when you cook with a hot stove, oven, or grill. ? Stand a safe distance away from open fires.  Avoid people who received a vaccine in the past 30 days if that vaccine contained a live version of the germ (live vaccine). You should not get a live vaccine. Common live vaccines are varicella, measles, mumps, and rubella.  Do not share food utensils.  Do not use tampons, enemas, or rectal suppositories unless your health care provider has approved.  Keep all appointments as told by your health care provider. This is important. Contact a health care provider if:  You have a fever.  You have chills or you start to shake.  You have: ? A sore throat. ? A warm, red, or tender area on your skin. ? A cough. ? Frequent or painful urination. ? Vaginal discharge or itching.  You develop: ? Sores in your mouth or anus. ? Swollen lymph nodes. ? Red streaks on the skin. ? A rash.  You feel: ? Nauseous or you vomit. ? Very fatigued. ? Short of breath. This information is not intended to replace advice given to you by your health care provider. Make sure you discuss any questions you have with your health care provider. Document Released: 05/09/2002 Document Revised: 04/24/2016 Document Reviewed: 05/30/2015 Elsevier Interactive Patient Education  2018 [employer]  

## 2018-11-11 DIAGNOSIS — Z029 Encounter for administrative examinations, unspecified: Secondary | ICD-10-CM

## 2018-11-14 ENCOUNTER — Other Ambulatory Visit: Payer: 59

## 2018-11-14 NOTE — Assessment & Plan Note (Signed)
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 Neoadjuvant chemotherapy with Adriamycin and Cytoxan dose dense 4 followed byTaxol and carboplatinweekly 12 2. Followed by breast conserving surgery with sentinel lymph node study  3. Followed by adjuvant radiation therapy ------------------------------------------------------------------------------ Current treatment:Completed 4 cycles ofdense Adriamycin and Cytoxan,today is cycle6Taxol (carbois every 3 weeks)  Labs have been reviewed  Chemo toxicities: 1.Elevated LFTs:Mild elevation of ALT 2.Chemotherapy-induced anemia: Monitoring closelytoday's hemoglobin is 9.6 3.Elevated blood sugars: Discussed with her about cutting down on carbohydrate intake.  Monitoring closely for toxicities Return to clinic weekly for Taxol and every 2 weeks for follow-up withme 

## 2018-11-15 ENCOUNTER — Inpatient Hospital Stay (HOSPITAL_BASED_OUTPATIENT_CLINIC_OR_DEPARTMENT_OTHER): Payer: 59 | Admitting: Hematology and Oncology

## 2018-11-15 ENCOUNTER — Inpatient Hospital Stay: Payer: 59

## 2018-11-15 DIAGNOSIS — Z79899 Other long term (current) drug therapy: Secondary | ICD-10-CM

## 2018-11-15 DIAGNOSIS — Z171 Estrogen receptor negative status [ER-]: Secondary | ICD-10-CM

## 2018-11-15 DIAGNOSIS — G620 Drug-induced polyneuropathy: Secondary | ICD-10-CM

## 2018-11-15 DIAGNOSIS — D701 Agranulocytosis secondary to cancer chemotherapy: Secondary | ICD-10-CM | POA: Diagnosis not present

## 2018-11-15 DIAGNOSIS — Z95828 Presence of other vascular implants and grafts: Secondary | ICD-10-CM

## 2018-11-15 DIAGNOSIS — C50411 Malignant neoplasm of upper-outer quadrant of right female breast: Secondary | ICD-10-CM | POA: Diagnosis not present

## 2018-11-15 DIAGNOSIS — T451X5A Adverse effect of antineoplastic and immunosuppressive drugs, initial encounter: Secondary | ICD-10-CM

## 2018-11-15 DIAGNOSIS — R739 Hyperglycemia, unspecified: Secondary | ICD-10-CM

## 2018-11-15 LAB — CMP (CANCER CENTER ONLY)
ALT: 43 U/L (ref 0–44)
AST: 26 U/L (ref 15–41)
Albumin: 3.6 g/dL (ref 3.5–5.0)
Alkaline Phosphatase: 70 U/L (ref 38–126)
Anion gap: 8 (ref 5–15)
BUN: 7 mg/dL (ref 6–20)
CO2: 25 mmol/L (ref 22–32)
Calcium: 8.8 mg/dL — ABNORMAL LOW (ref 8.9–10.3)
Chloride: 109 mmol/L (ref 98–111)
Creatinine: 0.77 mg/dL (ref 0.44–1.00)
GFR, Est AFR Am: >60 mL/min (ref >60)
GFR, Estimated: >60 mL/min (ref >60)
Glucose, Bld: 148 mg/dL — ABNORMAL HIGH (ref 70–99)
POTASSIUM: 3.7 mmol/L (ref 3.5–5.1)
Sodium: 142 mmol/L (ref 135–145)
Total Bilirubin: 0.3 mg/dL (ref 0.3–1.2)
Total Protein: 6.5 g/dL (ref 6.5–8.1)

## 2018-11-15 LAB — CBC WITH DIFFERENTIAL (CANCER CENTER ONLY)
Abs Immature Granulocytes: 0.01 10*3/uL (ref 0.00–0.07)
Basophils Absolute: 0 10*3/uL (ref 0.0–0.1)
Basophils Relative: 0 %
EOS ABS: 0 10*3/uL (ref 0.0–0.5)
Eosinophils Relative: 0 %
HCT: 29 % — ABNORMAL LOW (ref 36.0–46.0)
Hemoglobin: 9.4 g/dL — ABNORMAL LOW (ref 12.0–15.0)
Immature Granulocytes: 0 %
Lymphocytes Relative: 44 %
Lymphs Abs: 1.3 10*3/uL (ref 0.7–4.0)
MCH: 34.3 pg — AB (ref 26.0–34.0)
MCHC: 32.4 g/dL (ref 30.0–36.0)
MCV: 105.8 fL — ABNORMAL HIGH (ref 80.0–100.0)
Monocytes Absolute: 0.4 10*3/uL (ref 0.1–1.0)
Monocytes Relative: 14 %
Neutro Abs: 1.3 10*3/uL — ABNORMAL LOW (ref 1.7–7.7)
Neutrophils Relative %: 42 %
Platelet Count: 128 10*3/uL — ABNORMAL LOW (ref 150–400)
RBC: 2.74 MIL/uL — ABNORMAL LOW (ref 3.87–5.11)
RDW: 17.1 % — AB (ref 11.5–15.5)
WBC: 3.1 10*3/uL — AB (ref 4.0–10.5)
nRBC: 0 % (ref 0.0–0.2)

## 2018-11-15 MED ORDER — SODIUM CHLORIDE 0.9 % IV SOLN
Freq: Once | INTRAVENOUS | Status: AC
  Administered 2018-11-15: 10:00:00 via INTRAVENOUS
  Filled 2018-11-15: qty 250

## 2018-11-15 MED ORDER — DIPHENHYDRAMINE HCL 50 MG/ML IJ SOLN
25.0000 mg | Freq: Once | INTRAMUSCULAR | Status: AC
  Administered 2018-11-15: 25 mg via INTRAVENOUS

## 2018-11-15 MED ORDER — DIPHENHYDRAMINE HCL 50 MG/ML IJ SOLN
INTRAMUSCULAR | Status: AC
  Filled 2018-11-15: qty 1

## 2018-11-15 MED ORDER — HEPARIN SOD (PORK) LOCK FLUSH 100 UNIT/ML IV SOLN
500.0000 [IU] | Freq: Once | INTRAVENOUS | Status: DC | PRN
  Filled 2018-11-15: qty 5

## 2018-11-15 MED ORDER — PALONOSETRON HCL INJECTION 0.25 MG/5ML
0.2500 mg | Freq: Once | INTRAVENOUS | Status: AC
  Administered 2018-11-15: 0.25 mg via INTRAVENOUS

## 2018-11-15 MED ORDER — SODIUM CHLORIDE 0.9 % IV SOLN
700.0000 mg | Freq: Once | INTRAVENOUS | Status: AC
  Administered 2018-11-15: 700 mg via INTRAVENOUS
  Filled 2018-11-15: qty 70

## 2018-11-15 MED ORDER — FAMOTIDINE IN NACL 20-0.9 MG/50ML-% IV SOLN
20.0000 mg | Freq: Once | INTRAVENOUS | Status: AC
  Administered 2018-11-15: 20 mg via INTRAVENOUS

## 2018-11-15 MED ORDER — SODIUM CHLORIDE 0.9 % IV SOLN
65.0000 mg/m2 | Freq: Once | INTRAVENOUS | Status: AC
  Administered 2018-11-15: 138 mg via INTRAVENOUS
  Filled 2018-11-15: qty 23

## 2018-11-15 MED ORDER — FAMOTIDINE IN NACL 20-0.9 MG/50ML-% IV SOLN
INTRAVENOUS | Status: AC
  Filled 2018-11-15: qty 50

## 2018-11-15 MED ORDER — PALONOSETRON HCL INJECTION 0.25 MG/5ML
INTRAVENOUS | Status: AC
  Filled 2018-11-15: qty 5

## 2018-11-15 MED ORDER — SODIUM CHLORIDE 0.9% FLUSH
10.0000 mL | INTRAVENOUS | Status: DC | PRN
  Administered 2018-11-15: 10 mL
  Filled 2018-11-15: qty 10

## 2018-11-15 MED ORDER — SODIUM CHLORIDE 0.9 % IV SOLN
Freq: Once | INTRAVENOUS | Status: AC
  Administered 2018-11-15: 11:00:00 via INTRAVENOUS
  Filled 2018-11-15: qty 5

## 2018-11-15 MED ORDER — SODIUM CHLORIDE 0.9% FLUSH
10.0000 mL | INTRAVENOUS | Status: DC | PRN
  Filled 2018-11-15: qty 10

## 2018-11-15 NOTE — Patient Instructions (Signed)
[facility] Discharge Instructions for Patients Receiving Chemotherapy  Today you received the following chemotherapy agents:  Taxol.  To help prevent nausea and vomiting after your treatment, we encourage you to take your nausea medication as directed.   If you develop nausea and vomiting that is not controlled by your nausea medication, call the clinic.   BELOW ARE SYMPTOMS THAT SHOULD BE REPORTED IMMEDIATELY:  *FEVER GREATER THAN 100.5 F  *CHILLS WITH OR WITHOUT FEVER  NAUSEA AND VOMITING THAT IS NOT CONTROLLED WITH YOUR NAUSEA MEDICATION  *UNUSUAL SHORTNESS OF BREATH  *UNUSUAL BRUISING OR BLEEDING  TENDERNESS IN MOUTH AND THROAT WITH OR WITHOUT PRESENCE OF ULCERS  *URINARY PROBLEMS  *BOWEL PROBLEMS  UNUSUAL RASH Items with * indicate a potential emergency and should be followed up as soon as possible.  Feel free to call the clinic should you have any questions or concerns. The clinic phone number is [phone].  Please show the CHEMO ALERT CARD at check-in to the Emergency Department and triage nurse.   

## 2018-11-15 NOTE — Progress Notes (Signed)
Patient Care Team: Tysinger, Camelia Eng, PA-C as PCP - General (Family Medicine) Excell Seltzer, MD as Consulting Physician (General Surgery) Nicholas Lose, MD as Consulting Physician (Hematology and Oncology) Gery Pray, MD as Consulting Physician (Radiation Oncology)  DIAGNOSIS:  Encounter Diagnosis  Name Primary?  . 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 The Common Hereditary Cancers Panel offered by [employer] includes sequencing and/or deletion duplication testing of the following 47 genes: APC, ATM, AXIN2, BARD1, BMPR1A, BRCA1, BRCA2, BRIP1, CDH1, CDKN2A (p14ARF), CDKN2A (p16INK4a), CKD4, CHEK2, CTNNA1, DICER1, EPCAM (Deletion/duplication testing only), GREM1 (promoter region deletion/duplication testing only), KIT, MEN1, MLH1, MSH2, MSH3, MSH6, MUTYH, NBN, NF1, NHTL1, PALB2, PDGFRA, PMS2, POLD1, POLE, PTEN, RAD50, RAD51C, RAD51D, SDHB, SDHC, SDHD, SMAD4, SMARCA4. STK11, TP53, TSC1, TSC2, and VHL.  The following genes were evaluated for sequence changes only: SDHA and HOXB13 c.251G>A variant only.  The report date is 07/23/2018.     CHIEF COMPLIANT: Cycle 9 Taxol  INTERVAL HISTORY: AILAH BARNA is a 9-year with above-mentioned history of neoadjuvant chemotherapy and she is receiving Taxol  today.  She has noticed sudden onset of peripheral neuropathy in the feet and toes.  It is still and the tips of the fingers and toes.  She has not dropped any objects she has not stumbled on her feet.  REVIEW OF SYSTEMS:   Constitutional: Denies fevers, chills or abnormal weight loss Eyes: Denies blurriness of vision Ears, nose, mouth, throat, and face: Denies mucositis or sore throat Respiratory: Denies cough, dyspnea or wheezes Cardiovascular: Denies palpitation, chest discomfort Gastrointestinal:  Denies nausea, heartburn or change in bowel habits Skin: Denies abnormal skin rashes Lymphatics: Denies new lymphadenopathy or easy bruising Neurological: Neuropathy in hands and feet tips of the fingers and toes Behavioral/Psych: Mood is stable, no new changes  Extremities: No lower extremity edema   All other systems were reviewed with the patient and are negative.  I have reviewed the past medical history, past surgical history, social history and family history with the patient and they are unchanged from previous note.  ALLERGIES:  has No Known Allergies.  MEDICATIONS:  Current Outpatient Medications  Medication Sig Dispense Refill  . acetaminophen (TYLENOL) 500 MG tablet Take 500 mg by mouth every 6 (six) hours as needed for mild pain.     Marland Kitchen lidocaine-prilocaine (EMLA) cream Apply to affected area once 30 g 3  . LORazepam (ATIVAN) 0.5 MG tablet Take 1 tablet (0.5 mg total) by mouth at bedtime as needed for sleep. 30 tablet 0  . magic mouthwash w/lidocaine SOLN Take 5 mLs by mouth 4 (four) times daily as needed for mouth pain. 240 mL 1  . naproxen sodium (ALEVE) 220 MG tablet Take 220 mg by mouth as needed (pain).     Marland Kitchen  ondansetron (ZOFRAN) 8 MG tablet Take 1 tablet (8 mg total) by mouth 2 (two) times daily as needed. Start on the third day after chemotherapy. 30 tablet 1  . prochlorperazine (COMPAZINE) 10 MG tablet Take 1 tablet (10 mg total) by mouth every 6 (six) hours as needed  (Nausea or vomiting). 30 tablet 1  . VITAMIN E PO Take by mouth daily.      No current facility-administered medications for this visit.    Facility-Administered Medications Ordered in Other Visits  Medication Dose Route Frequency Provider Last Rate Last Dose  . sodium chloride flush (NS) 0.9 % injection 10 mL  10 mL Intracatheter PRN Nicholas Lose, MD   10 mL at 11/15/18 0859    PHYSICAL EXAMINATION: ECOG PERFORMANCE STATUS: 1 - Symptomatic but completely ambulatory  Vitals:   11/15/18 0947  BP: (!) 141/97  Pulse: 94  Resp: 17  Temp: 98.3 F (36.8 C)  SpO2: 100%   Filed Weights   11/15/18 0947  Weight: 214 lb (97.1 kg)    GENERAL:alert, no distress and comfortable SKIN: skin color, texture, turgor are normal, no rashes or significant lesions EYES: normal, Conjunctiva are pink and non-injected, sclera clear OROPHARYNX:no exudate, no erythema and lips, buccal mucosa, and tongue normal  NECK: supple, thyroid normal size, non-tender, without nodularity LYMPH:  no palpable lymphadenopathy in the cervical, axillary or inguinal LUNGS: clear to auscultation and percussion with normal breathing effort HEART: regular rate & rhythm and no murmurs and no lower extremity edema ABDOMEN:abdomen soft, non-tender and normal bowel sounds MUSCULOSKELETAL:no cyanosis of digits and no clubbing  NEURO: alert & oriented x 3 with fluent speech, peripheral neuropathy EXTREMITIES: No lower extremity edema    LABORATORY DATA:  I have reviewed the data as listed CMP Latest Ref Rng & Units 11/08/2018 11/01/2018 10/25/2018  Glucose 70 - 99 mg/dL 144(H) 141(H) 119(H)  BUN 6 - 20 mg/dL '7 8 7  '$ Creatinine 0.44 - 1.00 mg/dL 0.71 0.75 0.74  Sodium 135 - 145 mmol/L 141 142 142  Potassium 3.5 - 5.1 mmol/L 3.6 3.6 3.8  Chloride 98 - 111 mmol/L 109 109 109  CO2 22 - 32 mmol/L '25 24 25  '$ Calcium 8.9 - 10.3 mg/dL 8.6(L) 8.5(L) 8.8(L)  Total Protein 6.5 - 8.1 g/dL 6.2(L) 6.2(L) 6.8  Total Bilirubin 0.3 - 1.2  mg/dL 0.3 0.3 0.3  Alkaline Phos 38 - 126 U/L 60 63 71  AST 15 - 41 U/L '23 16 25  '$ ALT 0 - 44 U/L 37 20 45(H)    Lab Results  Component Value Date   WBC 3.1 (L) 11/15/2018   HGB 9.4 (L) 11/15/2018   HCT 29.0 (L) 11/15/2018   MCV 105.8 (H) 11/15/2018   PLT 128 (L) 11/15/2018   NEUTROABS 1.3 (L) 11/15/2018    ASSESSMENT & PLAN:  Malignant neoplasm of upper-outer quadrant of right breast in female, estrogen receptor negative ([place]) 06/23/2018:Palpable lump in the right breast for 2 months UOQ by mammogram and ultrasound cystic and solid lesion at 10 o'clock position measuring 4.1 cm, axilla negative, 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 Neoadjuvant chemotherapy with Adriamycin and Cytoxan dose dense 4 followed byTaxol and carboplatinweekly 12 2. Followed by breast conserving surgery with sentinel lymph node study  3. Followed by adjuvant radiation therapy ------------------------------------------------------------------------------ Current treatment:Completed 4 cycles ofdense Adriamycin and Cytoxan,today is cycle9Taxol (carbois every 3 weeks)  Labs  have been reviewed  Chemo toxicities: 1.Elevated LFTs:Mild elevation of ALT 2.Chemotherapy-induced anemia: Monitoring closelytoday's hemoglobin is 9.6 3.Elevated blood sugars: Discussed with her about cutting down on carbohydrate intake. 4.  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  This came on suddenly.  I would like to reduce the dosage of chemotherapy of Taxol to 65 mg/m.  If it continues to get worse and we may have to stop her chemo. 5.  Chemo-induced leukopenia: ANC 1.3 okay to treat today.  Monitoring closely for toxicities Return to clinic weekly for Taxol   No orders of the defined types were placed in this encounter.  The patient has a good  understanding of the overall plan. she agrees with it. she will call with any problems that may develop before the next visit here.   Harriette Ohara, MD 11/15/18

## 2018-11-15 NOTE — Patient Instructions (Signed)

## 2018-11-16 ENCOUNTER — Telehealth: Payer: Self-pay | Admitting: Hematology and Oncology

## 2018-11-16 NOTE — Telephone Encounter (Signed)
No 11/15/18 los.  °

## 2018-11-19 ENCOUNTER — Telehealth: Payer: Self-pay

## 2018-11-19 NOTE — Telephone Encounter (Signed)
VM left from patient stating she was having increased numbness and tingling in hands and feet.    Nurse returned call.  No answer.  Left message for return call.  Informed on voicemail we will assess at appointment on 11/22/2018.

## 2018-11-22 ENCOUNTER — Inpatient Hospital Stay: Payer: 59

## 2018-11-22 ENCOUNTER — Other Ambulatory Visit: Payer: Self-pay | Admitting: Medical

## 2018-11-22 ENCOUNTER — Inpatient Hospital Stay (HOSPITAL_BASED_OUTPATIENT_CLINIC_OR_DEPARTMENT_OTHER): Payer: 59 | Admitting: Medical

## 2018-11-22 VITALS — BP 126/83 | HR 95 | Temp 98.7°F | Resp 18

## 2018-11-22 DIAGNOSIS — Z79899 Other long term (current) drug therapy: Secondary | ICD-10-CM

## 2018-11-22 DIAGNOSIS — G620 Drug-induced polyneuropathy: Secondary | ICD-10-CM

## 2018-11-22 DIAGNOSIS — T451X5A Adverse effect of antineoplastic and immunosuppressive drugs, initial encounter: Secondary | ICD-10-CM

## 2018-11-22 DIAGNOSIS — R739 Hyperglycemia, unspecified: Secondary | ICD-10-CM

## 2018-11-22 DIAGNOSIS — C50411 Malignant neoplasm of upper-outer quadrant of right female breast: Secondary | ICD-10-CM | POA: Diagnosis not present

## 2018-11-22 DIAGNOSIS — D701 Agranulocytosis secondary to cancer chemotherapy: Secondary | ICD-10-CM | POA: Diagnosis not present

## 2018-11-22 DIAGNOSIS — Z171 Estrogen receptor negative status [ER-]: Secondary | ICD-10-CM

## 2018-11-22 DIAGNOSIS — Z95828 Presence of other vascular implants and grafts: Secondary | ICD-10-CM

## 2018-11-22 LAB — CBC WITH DIFFERENTIAL (CANCER CENTER ONLY)
Abs Immature Granulocytes: 0.01 10*3/uL (ref 0.00–0.07)
Basophils Absolute: 0 10*3/uL (ref 0.0–0.1)
Basophils Relative: 1 %
Eosinophils Absolute: 0 10*3/uL (ref 0.0–0.5)
Eosinophils Relative: 1 %
HCT: 27.6 % — ABNORMAL LOW (ref 36.0–46.0)
Hemoglobin: 9.1 g/dL — ABNORMAL LOW (ref 12.0–15.0)
Immature Granulocytes: 0 %
Lymphocytes Relative: 49 %
Lymphs Abs: 2 10*3/uL (ref 0.7–4.0)
MCH: 34.1 pg — AB (ref 26.0–34.0)
MCHC: 33 g/dL (ref 30.0–36.0)
MCV: 103.4 fL — ABNORMAL HIGH (ref 80.0–100.0)
MONO ABS: 0.3 10*3/uL (ref 0.1–1.0)
MONOS PCT: 9 %
Neutro Abs: 1.6 10*3/uL — ABNORMAL LOW (ref 1.7–7.7)
Neutrophils Relative %: 40 %
Platelet Count: 100 10*3/uL — ABNORMAL LOW (ref 150–400)
RBC: 2.67 MIL/uL — ABNORMAL LOW (ref 3.87–5.11)
RDW: 15.3 % (ref 11.5–15.5)
WBC Count: 4 10*3/uL (ref 4.0–10.5)
nRBC: 0.5 % — ABNORMAL HIGH (ref 0.0–0.2)

## 2018-11-22 LAB — CMP (CANCER CENTER ONLY)
ALBUMIN: 3.8 g/dL (ref 3.5–5.0)
ALT: 24 U/L (ref 0–44)
AST: 16 U/L (ref 15–41)
Alkaline Phosphatase: 76 U/L (ref 38–126)
Anion gap: 7 (ref 5–15)
BUN: 7 mg/dL (ref 6–20)
CO2: 26 mmol/L (ref 22–32)
CREATININE: 0.75 mg/dL (ref 0.44–1.00)
Calcium: 9 mg/dL (ref 8.9–10.3)
Chloride: 107 mmol/L (ref 98–111)
GFR, Est AFR Am: >60 mL/min (ref >60)
GFR, Estimated: >60 mL/min (ref >60)
Glucose, Bld: 149 mg/dL — ABNORMAL HIGH (ref 70–99)
Potassium: 3.5 mmol/L (ref 3.5–5.1)
Sodium: 140 mmol/L (ref 135–145)
Total Bilirubin: 0.2 mg/dL — ABNORMAL LOW (ref 0.3–1.2)
Total Protein: 6.7 g/dL (ref 6.5–8.1)

## 2018-11-22 MED ORDER — SODIUM CHLORIDE 0.9% FLUSH
10.0000 mL | INTRAVENOUS | Status: DC | PRN
  Administered 2018-11-22: 10 mL
  Filled 2018-11-22: qty 10

## 2018-11-22 MED ORDER — GABAPENTIN 300 MG PO CAPS: 300.0000 mg | ORAL_CAPSULE | Freq: Every day | ORAL | 3 refills | Status: DC

## 2018-11-22 MED ORDER — SODIUM CHLORIDE 0.9 % IV SOLN
Freq: Once | INTRAVENOUS | Status: AC
  Administered 2018-11-22: 14:00:00 via INTRAVENOUS
  Filled 2018-11-22: qty 250

## 2018-11-22 MED ORDER — DIPHENHYDRAMINE HCL 50 MG/ML IJ SOLN
INTRAMUSCULAR | Status: AC
  Filled 2018-11-22: qty 1

## 2018-11-22 MED ORDER — DIPHENHYDRAMINE HCL 50 MG/ML IJ SOLN
25.0000 mg | Freq: Once | INTRAMUSCULAR | Status: AC
  Administered 2018-11-22: 25 mg via INTRAVENOUS

## 2018-11-22 MED ORDER — SODIUM CHLORIDE 0.9 % IV SOLN
65.0000 mg/m2 | Freq: Once | INTRAVENOUS | Status: AC
  Administered 2018-11-22: 138 mg via INTRAVENOUS
  Filled 2018-11-22: qty 23

## 2018-11-22 MED ORDER — HEPARIN SOD (PORK) LOCK FLUSH 100 UNIT/ML IV SOLN
500.0000 [IU] | Freq: Once | INTRAVENOUS | Status: AC | PRN
  Administered 2018-11-22: 500 [IU]
  Filled 2018-11-22: qty 5

## 2018-11-22 MED ORDER — DEXAMETHASONE SODIUM PHOSPHATE 10 MG/ML IJ SOLN
INTRAMUSCULAR | Status: AC
  Filled 2018-11-22: qty 1

## 2018-11-22 MED ORDER — DEXAMETHASONE SODIUM PHOSPHATE 10 MG/ML IJ SOLN
10.0000 mg | Freq: Once | INTRAMUSCULAR | Status: AC
  Administered 2018-11-22: 10 mg via INTRAVENOUS

## 2018-11-22 MED ORDER — FAMOTIDINE IN NACL 20-0.9 MG/50ML-% IV SOLN
INTRAVENOUS | Status: AC
  Filled 2018-11-22: qty 50

## 2018-11-22 MED ORDER — FAMOTIDINE IN NACL 20-0.9 MG/50ML-% IV SOLN
20.0000 mg | Freq: Once | INTRAVENOUS | Status: AC
  Administered 2018-11-22: 20 mg via INTRAVENOUS

## 2018-11-22 MED FILL — GABAPENTIN 300 MG CAPSULE: 300 | 30 days supply | Qty: 30 | Fill #0

## 2018-11-22 NOTE — Patient Instructions (Signed)

## 2018-11-22 NOTE — Patient Instructions (Signed)
[facility] Discharge Instructions for Patients Receiving Chemotherapy  Today you received the following chemotherapy agents Paclitaxel (TAXOL).  To help prevent nausea and vomiting after your treatment, we encourage you to take your nausea medication as prescribed.  If you develop nausea and vomiting that is not controlled by your nausea medication, call the clinic.   BELOW ARE SYMPTOMS THAT SHOULD BE REPORTED IMMEDIATELY:  *FEVER GREATER THAN 100.5 F  *CHILLS WITH OR WITHOUT FEVER  NAUSEA AND VOMITING THAT IS NOT CONTROLLED WITH YOUR NAUSEA MEDICATION  *UNUSUAL SHORTNESS OF BREATH  *UNUSUAL BRUISING OR BLEEDING  TENDERNESS IN MOUTH AND THROAT WITH OR WITHOUT PRESENCE OF ULCERS  *URINARY PROBLEMS  *BOWEL PROBLEMS  UNUSUAL RASH Items with * indicate a potential emergency and should be followed up as soon as possible.  Feel free to call the clinic should you have any questions or concerns. The clinic phone number is [phone].  Please show the CHEMO ALERT CARD at check-in to the Emergency Department and triage nurse.   

## 2018-11-25 ENCOUNTER — Ambulatory Visit
Admission: RE | Admit: 2018-11-25 | Discharge: 2018-11-25 | Disposition: A | Discharge status: Home / Self Care | Payer: 59 | Source: Ambulatory Visit | Attending: Adult Health | Admitting: Adult Health

## 2018-11-25 DIAGNOSIS — Z853 Personal history of malignant neoplasm of breast: Secondary | ICD-10-CM | POA: Diagnosis not present

## 2018-11-25 DIAGNOSIS — C50411 Malignant neoplasm of upper-outer quadrant of right female breast: Secondary | ICD-10-CM

## 2018-11-25 DIAGNOSIS — Z171 Estrogen receptor negative status [ER-]: Secondary | ICD-10-CM

## 2018-11-25 MED ORDER — GADOBUTROL 1 MMOL/ML IV SOLN
9.0000 mL | Freq: Once | INTRAVENOUS | Status: AC | PRN
  Administered 2018-11-25: 9 mL via INTRAVENOUS

## 2018-11-25 NOTE — Progress Notes (Signed)
[facility]pt[facility] Progress Note   [name] [id number] [birth year] 47 y.o.  Tara Rodriguez is managed by Dr. Nicholas Rodriguez  Actively treated with chemotherapy/immunotherapy/hormonal therapy: yes  Current Therapy: Carboplatin and Paclitaxel  Last Treated: 11/15/18 (cycle 8 day 1)  Assessment: Plan:    Malignant neoplasm of upper-outer quadrant of right breast in female, estrogen receptor negative ([place])  Neuropathy due to chemotherapeutic drug ([place])    1) ER Negative malignant neoplasm of the right breast: The patient is receiving cycle 8 day 1 of carboplatin and paclitaxel today.  She will see Dr. Lindi Rodriguez for follow up on 11/29/18.  2) Neuropathy: The patient was begun on Gabapentin 300 mg PO every night at bedtime.   Please see After Visit Summary for patient specific instructions.  Future Appointments  Date Time Provider [place]  11/29/2018  9:45 AM [facility] LAB 3 [facility] None  11/29/2018 10:00 AM [facility] [place] FLUSH [facility] None  11/29/2018 10:45 AM Tara Lose, MD [facility] None  11/29/2018 11:00 AM [facility] INFUSION [facility] None    No orders of the defined types were placed in this encounter.      Subjective:   Patient ID:  Tara Rodriguez is a 47 y.o. (DOB [birth year]) female.  Chief Complaint: No chief complaint on file.   HPI Tara Rodriguez is a 47 y.o. female with an ER malignant neoplasm of the right breast.  She is followed by Dr. Lindi Rodriguez and is receiving Cycle 8 Day 1 of carboplatin and paclitaxel today.  She has developed numbness and tingling in her toes.  She is not on any medication for this.  Medications: I have reviewed the patient's current medications.  Allergies: No Known Allergies  Past Medical History:  Diagnosis Date  . Acrochordon    skin, left lower breast  . Allergy   . Anemia    history of  . Blood transfusion without reported diagnosis    during hysterectomy  . Family history of breast cancer     . Family history of colon cancer   . Migraine   . S/P hysterectomy    hx/o fibroids  . Wears glasses     Past Surgical History:  Procedure Laterality Date  . ABDOMINAL HYSTERECTOMY  2009   total, due to fibroids  . COLONOSCOPY  2009   due to anemia  . PORTACATH PLACEMENT N/A 07/05/2018   Procedure: INSERTION PORT-A-CATH;  Surgeon: Tara Seltzer, MD;  Location: [facility];  Service: General;  Laterality: N/A;    Family History  Problem Relation Age of Onset  . Heart disease Mother        pacemaker  . Diabetes Mother   . Arthritis Mother   . Alcohol abuse Father   . Breast cancer Sister        29s  . Breast cancer Maternal Grandmother        dx 1, dx 38s  . Stroke Maternal Grandfather   . Colon cancer Paternal Grandmother   . Uterine cancer Paternal Grandmother   . Cancer Paternal Grandmother        unknown 3rd cancer  . Hypertension Neg Hx   . Hyperlipidemia Neg Hx     Social History   Socioeconomic History  . Marital status: Single    Spouse name: Not on file  . Number of children: Not on file  . Years of education: Not on file  . Highest education level: Not on file  Occupational History  .  Not on file  Social Needs  . Financial resource strain: Not on file  . Food insecurity:    Worry: Not on file    Inability: Not on file  . Transportation needs:    Medical: Not on file    Non-medical: Not on file  Tobacco Use  . Smoking status: Never Smoker  . Smokeless tobacco: Never Used  Substance and Sexual Activity  . Alcohol use: No  . Drug use: No  . Sexual activity: Not on file  Lifestyle  . Physical activity:    Days per week: Not on file    Minutes per session: Not on file  . Stress: Not on file  Relationships  . Social connections:    Talks on phone: Not on file    Gets together: Not on file    Attends religious service: Not on file    Active member of club or organization: Not on file    Attends meetings of clubs or  organizations: Not on file    Relationship status: Not on file  . Intimate partner violence:    Fear of current or ex partner: Not on file    Emotionally abused: Not on file    Physically abused: Not on file    Forced sexual activity: Not on file  Other Topics Concern  . 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   05/2018    Past Medical History, Surgical history, Social history, and Family history were reviewed and updated as appropriate.   Please see review of systems for further details on the patient's review from today.   Review of Systems:  Review of Systems  Constitutional: Negative for activity change.  Musculoskeletal: Negative for arthralgias and gait problem.  Neurological: Positive for numbness. Negative for dizziness, tremors and weakness.    Objective:   Physical Exam:  There were no vitals taken for this visit. ECOG: 0  Physical Exam Constitutional:      Appearance: Normal appearance.  HENT:     Head: Normocephalic and atraumatic.  Musculoskeletal:        General: No swelling or deformity.  Skin:    General: Skin is warm and dry.     Coloration: Skin is not jaundiced or pale.     Findings: No erythema.  Neurological:     General: No focal deficit present.     Mental Status: She is alert.  Psychiatric:        Mood and Affect: Mood normal.        Behavior: Behavior normal.        Thought Content: Thought content normal.        Judgment: Judgment normal.     Lab Review:     Component Value Date/Time   NA 140 11/22/2018 1228   NA 141 06/21/2018 1014   K 3.5 11/22/2018 1228   CL 107 11/22/2018 1228   CO2 26 11/22/2018 1228   GLUCOSE 149 (H) 11/22/2018 1228   BUN 7 11/22/2018 1228   BUN 9 06/21/2018 1014   CREATININE 0.75 11/22/2018 1228   CREATININE 0.75 10/15/2015 0001   CALCIUM 9.0 11/22/2018 1228   PROT 6.7 11/22/2018 1228   PROT 7.0  06/21/2018 1014   ALBUMIN 3.8 11/22/2018 1228   ALBUMIN 4.7 06/21/2018 1014   AST 16 11/22/2018 1228   ALT 24 11/22/2018 1228   ALKPHOS 76 11/22/2018 1228  BILITOT 0.2 (L) 11/22/2018 1228   GFRNONAA >60 11/22/2018 1228   GFRAA >60 11/22/2018 1228       Component Value Date/Time   WBC 4.0 11/22/2018 1228   WBC 5.6 08/03/2018 0808   RBC 2.67 (L) 11/22/2018 1228   HGB 9.1 (L) 11/22/2018 1228   HGB 13.7 06/21/2018 1014   HCT 27.6 (L) 11/22/2018 1228   HCT 42.7 06/21/2018 1014   PLT 100 (L) 11/22/2018 1228   PLT 332 06/21/2018 1014   MCV 103.4 (H) 11/22/2018 1228   MCV 90 06/21/2018 1014   MCH 34.1 (H) 11/22/2018 1228   MCHC 33.0 11/22/2018 1228   RDW 15.3 11/22/2018 1228   RDW 14.9 06/21/2018 1014   LYMPHSABS 2.0 11/22/2018 1228   MONOABS 0.3 11/22/2018 1228   EOSABS 0.0 11/22/2018 1228   BASOSABS 0.0 11/22/2018 1228   -------------------------------  Imaging from last 24 hours (if applicable):  Radiology interpretation: Mr Breast Bilateral W Chambers Cad  Result Date: 11/25/2018 CLINICAL DATA:  RIGHT breast cancer, diagnosed by ultrasound-guided biopsy on 06/23/2018. Currently undergoing chemotherapy. LABS:  Not applicable EXAM: BILATERAL BREAST MRI WITH AND WITHOUT CONTRAST TECHNIQUE: Multiplanar, multisequence MR images of both breasts were obtained prior to and following the intravenous administration of 9 ml of Gadavist Three-dimensional MR images were rendered by post-processing of the original MR data on an independent workstation. The three-dimensional MR images were interpreted, and findings are reported in the following complete MRI report for this study. Three dimensional images were evaluated at the independent DynaCad workstation COMPARISON:  Previous exams including breast MRI dated 07/07/2018. FINDINGS: Breast composition: a. Almost entirely fat. Background parenchymal enhancement: Minimal Right breast: Near complete resolution of the rim enhancing mass  with central cavitation (biopsy-proven invasive ductal carcinoma) within the upper-outer quadrant of the RIGHT breast, at posterior depth, with only a small amount of residual enhancement at the inferior margin of the biopsy site (underlying the biopsy clip) with associated persistent enhancement kinetics. No additional suspicious enhancing masses, non-mass enhancement or secondary signs of malignancy within the RIGHT breast. Left breast: No mass or abnormal enhancement. Lymph nodes: No abnormal appearing lymph nodes. Ancillary findings:  None. IMPRESSION: 1. Very good response to interval chemotherapy with near complete resolution of the rim enhancing mass (biopsy-proven invasive ductal carcinoma) located within the upper-outer quadrant of the RIGHT breast, with only a small amount of residual enhancement at the inferior margin of the biopsy site (underlying the biopsy clip). 2. No evidence of multifocal or multicentric disease within the RIGHT breast. 3. No evidence of contralateral disease within the LEFT breast. 4. No evidence of metastatic lymphadenopathy. RECOMMENDATION: Per current treatment plan. BI-RADS CATEGORY  6: Known biopsy-proven malignancy. Electronically Signed   By: Franki Cabot M.D.   On: 11/25/2018 10:12

## 2018-11-29 ENCOUNTER — Encounter: Payer: Self-pay | Admitting: *Deleted

## 2018-11-29 ENCOUNTER — Inpatient Hospital Stay: Payer: 59

## 2018-11-29 ENCOUNTER — Telehealth: Payer: Self-pay | Admitting: Hematology and Oncology

## 2018-11-29 ENCOUNTER — Inpatient Hospital Stay (HOSPITAL_BASED_OUTPATIENT_CLINIC_OR_DEPARTMENT_OTHER): Payer: 59 | Admitting: Hematology and Oncology

## 2018-11-29 VITALS — BP 130/89 | HR 97 | Temp 98.6°F | Resp 17 | Ht 69.0 in | Wt 212.0 lb

## 2018-11-29 DIAGNOSIS — Z171 Estrogen receptor negative status [ER-]: Secondary | ICD-10-CM | POA: Diagnosis not present

## 2018-11-29 DIAGNOSIS — C50411 Malignant neoplasm of upper-outer quadrant of right female breast: Secondary | ICD-10-CM

## 2018-11-29 DIAGNOSIS — Z79899 Other long term (current) drug therapy: Secondary | ICD-10-CM

## 2018-11-29 DIAGNOSIS — Z95828 Presence of other vascular implants and grafts: Secondary | ICD-10-CM

## 2018-11-29 LAB — CBC WITH DIFFERENTIAL (CANCER CENTER ONLY)
Abs Immature Granulocytes: 0.01 10*3/uL (ref 0.00–0.07)
BASOS ABS: 0 10*3/uL (ref 0.0–0.1)
Basophils Relative: 0 %
Eosinophils Absolute: 0 10*3/uL (ref 0.0–0.5)
Eosinophils Relative: 0 %
HCT: 26.9 % — ABNORMAL LOW (ref 36.0–46.0)
Hemoglobin: 8.8 g/dL — ABNORMAL LOW (ref 12.0–15.0)
Immature Granulocytes: 0 %
Lymphocytes Relative: 52 %
Lymphs Abs: 1.6 10*3/uL (ref 0.7–4.0)
MCH: 34.1 pg — ABNORMAL HIGH (ref 26.0–34.0)
MCHC: 32.7 g/dL (ref 30.0–36.0)
MCV: 104.3 fL — ABNORMAL HIGH (ref 80.0–100.0)
Monocytes Absolute: 0.3 10*3/uL (ref 0.1–1.0)
Monocytes Relative: 9 %
Neutro Abs: 1.2 10*3/uL — ABNORMAL LOW (ref 1.7–7.7)
Neutrophils Relative %: 39 %
PLATELETS: 91 10*3/uL — AB (ref 150–400)
RBC: 2.58 MIL/uL — AB (ref 3.87–5.11)
RDW: 15 % (ref 11.5–15.5)
WBC: 3.1 10*3/uL — AB (ref 4.0–10.5)
nRBC: 0 % (ref 0.0–0.2)

## 2018-11-29 LAB — CMP (CANCER CENTER ONLY)
ALT: 27 U/L (ref 0–44)
AST: 18 U/L (ref 15–41)
Albumin: 3.6 g/dL (ref 3.5–5.0)
Alkaline Phosphatase: 65 U/L (ref 38–126)
Anion gap: 8 (ref 5–15)
BUN: 10 mg/dL (ref 6–20)
CO2: 25 mmol/L (ref 22–32)
Calcium: 9 mg/dL (ref 8.9–10.3)
Chloride: 108 mmol/L (ref 98–111)
Creatinine: 0.78 mg/dL (ref 0.44–1.00)
GFR, Est AFR Am: >60 mL/min (ref >60)
GFR, Estimated: >60 mL/min (ref >60)
Glucose, Bld: 157 mg/dL — ABNORMAL HIGH (ref 70–99)
Potassium: 3.5 mmol/L (ref 3.5–5.1)
Sodium: 141 mmol/L (ref 135–145)
Total Bilirubin: 0.3 mg/dL (ref 0.3–1.2)
Total Protein: 6.5 g/dL (ref 6.5–8.1)

## 2018-11-29 MED ORDER — HEPARIN SOD (PORK) LOCK FLUSH 100 UNIT/ML IV SOLN
500.0000 [IU] | Freq: Once | INTRAVENOUS | Status: AC | PRN
  Administered 2018-11-29: 500 [IU]
  Filled 2018-11-29: qty 5

## 2018-11-29 MED ORDER — SODIUM CHLORIDE 0.9% FLUSH
10.0000 mL | INTRAVENOUS | Status: DC | PRN
  Filled 2018-11-29: qty 10

## 2018-11-29 MED ORDER — SODIUM CHLORIDE 0.9% FLUSH
10.0000 mL | INTRAVENOUS | Status: DC | PRN
  Administered 2018-11-29 (×2): 10 mL
  Filled 2018-11-29: qty 10

## 2018-11-29 NOTE — Progress Notes (Signed)
Patient Care Team: Tysinger, Camelia Eng, PA-C as PCP - General (Family Medicine) Excell Seltzer, MD as Consulting Physician (General Surgery) Nicholas Lose, MD as Consulting Physician (Hematology and Oncology) Gery Pray, MD as Consulting Physician (Radiation Oncology)  DIAGNOSIS:  Encounter Diagnoses  Name Primary?  . Malignant neoplasm of upper-outer quadrant of right breast in female, estrogen receptor negative ([place]) Yes  . Port-A-Cath in place     SUMMARY OF ONCOLOGIC HISTORY:   Malignant neoplasm of upper-outer quadrant of right breast in female, estrogen receptor negative ([place])   06/23/2018 Initial Diagnosis    Palpable lump in the right breast for 2 months UOQ by mammogram and ultrasound cystic and solid lesion at 10 o'clock position measuring 4.1 cm, axilla negative, biopsy revealed 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 The Common Hereditary Cancers Panel offered by [facility] includes sequencing and/or deletion duplication testing of the following 47 genes: APC, ATM, AXIN2, BARD1, BMPR1A, BRCA1, BRCA2, BRIP1, CDH1, CDKN2A (p14ARF), CDKN2A (p16INK4a), CKD4, CHEK2, CTNNA1, DICER1, EPCAM (Deletion/duplication testing only), GREM1 (promoter region deletion/duplication testing only), KIT, MEN1, MLH1, MSH2, MSH3, MSH6, MUTYH, NBN, NF1, NHTL1, PALB2, PDGFRA, PMS2, POLD1, POLE, PTEN, RAD50, RAD51C, RAD51D, SDHB, SDHC, SDHD, SMAD4, SMARCA4. STK11, TP53, TSC1, TSC2, and VHL.  The following genes were evaluated for sequence changes only: SDHA and HOXB13 c.251G>A variant only.  The report date is 07/23/2018.     CHIEF COMPLIANT: Today supposed to be cycle 11 of Taxol but we will discontinue chemo.  INTERVAL HISTORY: Tara Rodriguez is a 47 year old  above-mentioned history of right breast cancer who completed 10 cycles of Taxol and has developed worsening neuropathy in her hands and feet.  She is here today to receive her cycle 11 of treatment but because of profound neuropathy we are discontinuing her treatment.  She reports that the numbness is on most of her hand as well as in the bottom of the feet.  REVIEW OF SYSTEMS:   Constitutional: Denies fevers, chills or abnormal weight loss Eyes: Denies blurriness of vision Ears, nose, mouth, throat, and face: Denies mucositis or sore throat Respiratory: Denies cough, dyspnea or wheezes Cardiovascular: Denies palpitation, chest discomfort Gastrointestinal:  Denies nausea, heartburn or change in bowel habits Skin: Denies abnormal skin rashes Lymphatics: Denies new lymphadenopathy or easy bruising Neurological: Peripheral neuropathy Behavioral/Psych: Mood is stable, no new changes  Extremities: No lower extremity edema  All other systems were reviewed with the patient and are negative.  I have reviewed the past medical history, past surgical history, social history and family history with the patient and they are unchanged from previous note.  ALLERGIES:  has No Known Allergies.  MEDICATIONS:  Current Outpatient Medications  Medication Sig Dispense Refill  . acetaminophen (TYLENOL) 500 MG tablet Take 500 mg by mouth every 6 (six) hours as needed for mild pain.     Marland Kitchen gabapentin (NEURONTIN) 300 MG capsule Take 1 capsule (300 mg total) by mouth at bedtime. 30 capsule 3  . lidocaine-prilocaine (EMLA) cream Apply to affected area once 30 g 3  . LORazepam (ATIVAN) 0.5 MG tablet Take 1 tablet (0.5 mg total) by mouth at bedtime as needed for sleep. 30 tablet 0  . magic  mouthwash w/lidocaine SOLN Take 5 mLs by mouth 4 (four) times daily as needed for mouth pain. 240 mL 1  . naproxen sodium (ALEVE) 220 MG tablet Take 220 mg by mouth as needed (pain).     . ondansetron (ZOFRAN) 8 MG tablet Take 1  tablet (8 mg total) by mouth 2 (two) times daily as needed. Start on the third day after chemotherapy. 30 tablet 1  . prochlorperazine (COMPAZINE) 10 MG tablet Take 1 tablet (10 mg total) by mouth every 6 (six) hours as needed (Nausea or vomiting). 30 tablet 1  . VITAMIN E PO Take by mouth daily.      Current Facility-Administered Medications  Medication Dose Route Frequency Provider Last Rate Last Dose  . sodium chloride flush (NS) 0.9 % injection 10 mL  10 mL Intracatheter PRN Nicholas Lose, MD        PHYSICAL EXAMINATION: ECOG PERFORMANCE STATUS: 1 - Symptomatic but completely ambulatory  Vitals:   11/29/18 1114  BP: 130/89  Pulse: 97  Resp: 17  Temp: 98.6 F (37 C)  SpO2: 100%   Filed Weights   11/29/18 1114  Weight: 212 lb (96.2 kg)    GENERAL:alert, no distress and comfortable SKIN: skin color, texture, turgor are normal, no rashes or significant lesions EYES: normal, Conjunctiva are pink and non-injected, sclera clear OROPHARYNX:no exudate, no erythema and lips, buccal mucosa, and tongue normal  NECK: supple, thyroid normal size, non-tender, without nodularity LYMPH:  no palpable lymphadenopathy in the cervical, axillary or inguinal LUNGS: clear to auscultation and percussion with normal breathing effort HEART: regular rate & rhythm and no murmurs and no lower extremity edema ABDOMEN:abdomen soft, non-tender and normal bowel sounds MUSCULOSKELETAL:no cyanosis of digits and no clubbing  NEURO: alert & oriented x 3 with fluent speech, no focal motor/sensory deficits EXTREMITIES: No lower extremity edema   LABORATORY DATA:  I have reviewed the data as listed CMP Latest Ref Rng & Units 11/29/2018 11/22/2018 11/15/2018  Glucose 70 - 99 mg/dL 157(H) 149(H) 148(H)  BUN 6 - 20 mg/dL '10 7 7  '$ Creatinine 0.44 - 1.00 mg/dL 0.78 0.75 0.77  Sodium 135 - 145 mmol/L 141 140 142  Potassium 3.5 - 5.1 mmol/L 3.5 3.5 3.7  Chloride 98 - 111 mmol/L 108 107 109  CO2 22 - 32 mmol/L '25  26 25  '$ Calcium 8.9 - 10.3 mg/dL 9.0 9.0 8.8(L)  Total Protein 6.5 - 8.1 g/dL 6.5 6.7 6.5  Total Bilirubin 0.3 - 1.2 mg/dL 0.3 0.2(L) 0.3  Alkaline Phos 38 - 126 U/L 65 76 70  AST 15 - 41 U/L '18 16 26  '$ ALT 0 - 44 U/L 27 24 43    Lab Results  Component Value Date   WBC 3.1 (L) 11/29/2018   HGB 8.8 (L) 11/29/2018   HCT 26.9 (L) 11/29/2018   MCV 104.3 (H) 11/29/2018   PLT 91 (L) 11/29/2018   NEUTROABS 1.2 (L) 11/29/2018    ASSESSMENT & PLAN:  Malignant neoplasm of upper-outer quadrant of right breast in female, estrogen receptor negative ([facility]) 06/23/2018:Palpable lump in the right breast for 2 months UOQ by mammogram and ultrasound cystic and solid lesion at 10 o'clock position measuring 4.1 cm, axilla negative, 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 Neoadjuvant chemotherapy with Adriamycin and Cytoxan dose dense 4 followed byTaxol and carboplatinweekly 12 2. Followed by breast conserving surgery with sentinel lymph node  study  3. Followed by adjuvant radiation therapy ------------------------------------------------------------------------------ Current treatment:Completed 4 cycles ofdense Adriamycin and Cytoxan,today is cycle11Taxol (carbois every 3 weeks)  Labs have been reviewed  Chemo toxicities: 1.Elevated LFTs:Mild elevation of ALT 2.Chemotherapy-induced anemia: Monitoring closelytoday's hemoglobin is 8.8 3.Elevated blood sugars: Discussed with her about cutting down on carbohydrate intake. 4.  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  This came on suddenly.  It appears to continue to get worse in spite of reducing the dosage of Taxol.  She is symptom management and is currently on gabapentin.  We will discontinue further chemotherapy because of worsening neuropathy. 5.  Chemo-induced leukopenia:  ANC   is 1.2 and platelet count 91   Discontinue further chemotherapy Breast MRI showed near complete response and I revealed those images with the patient Return to clinic after surgery to discuss final pathology report.    No orders of the defined types were placed in this encounter.  The patient has a good understanding of the overall plan. she agrees with it. she will call with any problems that may develop before the next visit here.   Harriette Ohara, MD 11/29/18

## 2018-11-29 NOTE — Telephone Encounter (Signed)
Per 12/30 no los °

## 2018-11-29 NOTE — Assessment & Plan Note (Signed)
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 Neoadjuvant chemotherapy with Adriamycin and Cytoxan dose dense 4 followed byTaxol and carboplatinweekly 12 2. Followed by breast conserving surgery with sentinel lymph node study  3. Followed by adjuvant radiation therapy ------------------------------------------------------------------------------ Current treatment:Completed 4 cycles ofdense Adriamycin and Cytoxan,today is cycle11Taxol (carbois every 3 weeks)  Labs have been reviewed  Chemo toxicities: 1.Elevated LFTs:Mild elevation of ALT 2.Chemotherapy-induced anemia: Monitoring closelytoday's hemoglobin is 9.6 3.Elevated blood sugars: Discussed with her about cutting down on carbohydrate intake. 4.  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  This came on suddenly.  It appears to continue to get worse in spite of reducing the dosage of Taxol.  She is symptom management and is currently on gabapentin.   5.  Chemo-induced leukopenia: ANC     Monitoring closely for toxicities

## 2018-12-01 DIAGNOSIS — Z853 Personal history of malignant neoplasm of breast: Secondary | ICD-10-CM

## 2018-12-01 HISTORY — DX: Personal history of malignant neoplasm of breast: Z85.3

## 2018-12-09 DIAGNOSIS — Z0289 Encounter for other administrative examinations: Secondary | ICD-10-CM

## 2018-12-13 ENCOUNTER — Ambulatory Visit: Payer: Self-pay | Admitting: General Surgery

## 2018-12-13 DIAGNOSIS — C50911 Malignant neoplasm of unspecified site of right female breast: Secondary | ICD-10-CM

## 2018-12-14 ENCOUNTER — Other Ambulatory Visit: Payer: Self-pay | Admitting: General Surgery

## 2018-12-14 DIAGNOSIS — C50911 Malignant neoplasm of unspecified site of right female breast: Secondary | ICD-10-CM

## 2018-12-16 ENCOUNTER — Telehealth: Payer: Self-pay | Admitting: Hematology and Oncology

## 2018-12-16 NOTE — Telephone Encounter (Signed)
Scheduled appt per a 1/15 sch message - pt is aware of appt date and time

## 2018-12-17 ENCOUNTER — Other Ambulatory Visit: Payer: Self-pay | Admitting: *Deleted

## 2018-12-17 DIAGNOSIS — C50411 Malignant neoplasm of upper-outer quadrant of right female breast: Secondary | ICD-10-CM

## 2018-12-17 DIAGNOSIS — Z171 Estrogen receptor negative status [ER-]: Secondary | ICD-10-CM

## 2018-12-29 ENCOUNTER — Encounter (HOSPITAL_BASED_OUTPATIENT_CLINIC_OR_DEPARTMENT_OTHER): Payer: Self-pay | Admitting: *Deleted

## 2018-12-29 ENCOUNTER — Other Ambulatory Visit: Payer: Self-pay

## 2018-12-29 NOTE — Progress Notes (Signed)
No cbc required prior to surgery per Dr. Excell Seltzer.

## 2018-12-29 NOTE — Progress Notes (Signed)
Called Dr Hoxworth's office to see if he would like to recheck pt's cbc prior to surgery on 01/05/19. Last H&H 8.8/26.9 and plt 91. Spoke with Lanny Cramp and she will ask Dr Excell Seltzer to put the order in if he does want to recheck it.

## 2018-12-31 NOTE — Progress Notes (Signed)
Ensure pre surgery drink given with instructions to complete by 0545 dos, surgical soap given with instructions, pt verbalized understanding. 

## 2019-01-04 ENCOUNTER — Telehealth: Payer: Self-pay

## 2019-01-04 ENCOUNTER — Other Ambulatory Visit: Payer: Self-pay

## 2019-01-04 ENCOUNTER — Ambulatory Visit
Admission: RE | Admit: 2019-01-04 | Discharge: 2019-01-04 | Disposition: A | Discharge status: Home / Self Care | Payer: 59 | Source: Ambulatory Visit | Attending: General Surgery | Admitting: General Surgery

## 2019-01-04 DIAGNOSIS — C50911 Malignant neoplasm of unspecified site of right female breast: Secondary | ICD-10-CM

## 2019-01-04 MED ORDER — GABAPENTIN 300 MG PO CAPS: 300.0000 mg | ORAL_CAPSULE | Freq: Two times a day (BID) | ORAL | 3 refills | Status: DC

## 2019-01-04 MED FILL — GABAPENTIN 300 MG CAPSULE: 300 | 30 days supply | Qty: 60 | Fill #0

## 2019-01-04 NOTE — Telephone Encounter (Signed)
Pt called to report that her neuropathy symptoms have not been alleviated by gabapentin x1 month now. She has been taking 1 tablet at bedtime with very little to no relief. Suggested that pt increase to BID for a 1-2 weeks and to call back with report if there are improvement. Pt verbalized understanding and will change refill to send to pharmacy.

## 2019-01-04 NOTE — H&P (Signed)
History of Present Illness Marland Kitchen T. Sherece Gambrill MD; 12/13/2018 2:41 PM) The patient is a 48 year old female who presents with breast cancer. Patient returns for surgical treatment planning following neoadjuvant chemotherapy for triple negative invasive ductal carcinoma of the right breast. Her original presentation was as follows:  She is a postmenopausal (S/P TAH/BSO) female referred by Dr. Melanee Spry for evaluation of recently diagnosed carcinoma of the right breast. she recently discovered a mass in her upper outer right breast about a month ago.She was referred for workup.. Subsequent imaging included diagnostic mamogram showing an oval mass in the posterior upper outer breast and ultrasound showing and oval hypo-to anechoic mass at the 10:00 position right breast measuring 4.1 x 3.8cm. Axilla was negative. initially aspiration was attempted but no significant amount of fluid obtained. An ultrasound guided breast biopsy was performed on 06/23/2018 with pathology revealing nvasive ductal carcinoma of the breast. She is seen now in [facility] for initial treatment planning. She does not have a personal history of any previous breast problems.  Findings at that time were the following: Tumor size: 4.1 cm Tumor grade: 3 Estrogen Receptor: negative Progesterone Receptor: egative Her-2 neu: negative Lymph node status: negative  Her treatments were stopped somewhat early due to significant peripheral neuropathy. Subsequent repeat MRI has shown very good response to chemotherapy with nearly complete resolution of the biopsy-proven carcinoma in the upper outer right breast with just a small amount of residual enhancement adjacent to the biopsy clip. No other abnormalities seen in either breast or axilla.    Problem List/Past Medical Marland Kitchen T. Lemya Greenwell, MD; 12/13/2018 2:41 PM) MALIGNANT NEOPLASM OF UPPER-OUTER QUADRANT OF RIGHT BREAST IN FEMALE, ESTROGEN RECEPTOR NEGATIVE (C50.411)   Past Surgical  History Marland Kitchen T. Sinaya Minogue, MD; 12/13/2018 2:41 PM) Colon Polyp Removal - Colonoscopy  Hysterectomy (due to cancer) - Complete   Diagnostic Studies History Marland Kitchen T. Tylyn Derwin, MD; 12/13/2018 2:41 PM) Colonoscopy  5-10 years ago Mammogram  within last year Pap Smear  1-5 years ago  Allergies Marland Kitchen T. Lorissa Kishbaugh, MD; 12/13/2018 2:41 PM) No Known Drug Allergies [12/13/2018]: Allergies Reconciled  No Known Allergies [06/29/2018]:  Medication History Alean Rinne, RMA; 12/13/2018 2:25 PM) PROzac ('20MG'$  Capsule, Oral) Active. Robaxin ('500MG'$  Tablet, Oral) Active. Tylenol ('500MG'$  Capsule, Oral) Active. LORazepam (0.'5MG'$  Tablet, Oral) Active. Ondansetron HCl ('8MG'$  Tablet, Oral) Active. Prochlorperazine Maleate ('10MG'$  Tablet, Oral) Active. Naproxen ('250MG'$  Tablet, Oral) Active. Gabapentin ('300MG'$  Capsule, Oral) Active. Medications Reconciled  Social History Marland Kitchen T. Hrithik Boschee, MD; 12/13/2018 2:41 PM) Alcohol use  Occasional alcohol use. Caffeine use  Carbonated beverages, Coffee, Tea. No drug use  Tobacco use  Never smoker.  Family History Marland Kitchen T. Ioan Landini, MD; 12/13/2018 2:41 PM) Cancer  Sister. Diabetes Mellitus  Mother.  Pregnancy / Birth History Marland Kitchen T. Yuriko Portales, MD; 12/13/2018 2:41 PM) [name]  4 Para  4  Other Problems Marland Kitchen T. Fernanda Twaddell, MD; 12/13/2018 2:41 PM) Gastroesophageal Reflux Disease   Vitals Alean Rinne RMA; 12/13/2018 2:21 PM) 12/13/2018 2:21 PM Weight: 213 lb Height: 69in Body Surface Area: 2.12 m Body Mass Index: 31.45 kg/m  Temp.: 97.73F  Pulse: 98 (Regular)  BP: 148/85 (Sitting, Left Arm, Standard)       Physical Exam Marland Kitchen T. Caylei Sperry MD; 12/13/2018 2:49 PM) The physical exam findings are as follows: Note:General: Alert, somewhat overweight Afro-American female, in no distress Skin: Warm and dry without rash or infection. HEENT: Alopecia. No palpable masses or thyromegaly. Sclera  nonicteric. Lymph nodes: No cervical, supraclavicular, nodes palpable. Breasts: No palpable masses in  either breast with particular attention to the upper outer right breast where she previously had a palpable mass. Port-A-Cath site unremarkable. Lungs: Breath sounds clear and equal. No wheezing or increased work of breathing. Cardiovascular: Regular rate and rhythm without murmer. No JVD or edema. Extremities: No edema or joint swelling or deformity. No chronic venous stasis changes. Neurologic: Alert and fully oriented. Gait normal. No focal weakness. Psychiatric: Normal mood and affect. Thought content appropriate with normal judgement and insight    Assessment & Plan Marland Kitchen T. Summit Arroyave MD; 12/13/2018 2:58 PM) MALIGNANT NEOPLASM OF UPPER-OUTER QUADRANT OF RIGHT BREAST IN FEMALE, ESTROGEN RECEPTOR NEGATIVE (C50.411) Impression: 48 year old female with a recent diagnosis of cancer of the right breast, upper outer quadrant. Clinical stage IIB, triple negative. Node negative clinically and by imaging on presentation. Chemotherapy stopped a little early due to peripheral neuropathy. Excellent response on MRI with near total resolution of the breast mass. No other abnormality seen. She is ready to proceed with surgery. As previously discussed we plan to proceed with radioactive seed localized right breast lumpectomy and axillary sentinel lymph node biopsy. We discussed the nature of surgery and expected recovery. Discussed risks of anesthetic complications, bleeding, infection and slight risks of lymphedema. Discussed additional surgery may be required based on final pathology. Her questions were answered and she is ready to proceed. She will keep her Port-A-Cath as she likely will be eligible for treatments under a study protocol.  Current Plans Schedule for Surgery  Radioactive seed localized right breast lumpectomy and right axillary sentinel lymph node biopsy under general anesthesia as an  outpatient.

## 2019-01-04 NOTE — Anesthesia Preprocedure Evaluation (Signed)
Anesthesia Evaluation  Patient identified by MRN, date of birth, ID band Patient awake    Reviewed: Allergy & Precautions, NPO status , Patient's Chart, lab work & pertinent test results  Airway Mallampati: II  TM Distance: >3 FB Neck ROM: Full    Dental no notable dental hx.    Pulmonary neg pulmonary ROS,    Pulmonary exam normal breath sounds clear to auscultation       Cardiovascular negative cardio ROS Normal cardiovascular exam Rhythm:Regular Rate:Normal     Neuro/Psych  Headaches, negative psych ROS   GI/Hepatic negative GI ROS, Neg liver ROS,   Endo/Other  negative endocrine ROS  Renal/GU negative Renal ROS     Musculoskeletal negative musculoskeletal ROS (+)   Abdominal   Peds  Hematology  (+) Blood dyscrasia, anemia ,   Anesthesia Other Findings   Reproductive/Obstetrics negative OB ROS                             Anesthesia Physical  Anesthesia Plan  ASA: II  Anesthesia Plan: General   Post-op Pain Management: GA combined w/ Regional for post-op pain   Induction: Intravenous  PONV Risk Score and Plan: 3 and Ondansetron, Dexamethasone, Treatment may vary due to age or medical condition and Midazolam  Airway Management Planned: LMA and Oral ETT  Additional Equipment:   Intra-op Plan:   Post-operative Plan: Extubation in OR  Informed Consent: I have reviewed the patients History and Physical, chart, labs and discussed the procedure including the risks, benefits and alternatives for the proposed anesthesia with the patient or authorized representative who has indicated his/her understanding and acceptance.     Dental advisory given  Plan Discussed with: CRNA, Anesthesiologist and Surgeon  Anesthesia Plan Comments: ( )        Anesthesia Quick Evaluation

## 2019-01-05 ENCOUNTER — Ambulatory Visit (HOSPITAL_BASED_OUTPATIENT_CLINIC_OR_DEPARTMENT_OTHER): Payer: 59 | Admitting: Anesthesiology

## 2019-01-05 ENCOUNTER — Other Ambulatory Visit: Payer: Self-pay

## 2019-01-05 ENCOUNTER — Encounter (HOSPITAL_BASED_OUTPATIENT_CLINIC_OR_DEPARTMENT_OTHER)
Admission: RE | Disposition: A | Discharge status: Home / Self Care | Payer: Self-pay | Source: Home / Self Care | Attending: General Surgery

## 2019-01-05 ENCOUNTER — Encounter (HOSPITAL_BASED_OUTPATIENT_CLINIC_OR_DEPARTMENT_OTHER): Payer: Self-pay | Admitting: *Deleted

## 2019-01-05 ENCOUNTER — Ambulatory Visit
Admission: RE | Admit: 2019-01-05 | Discharge: 2019-01-05 | Disposition: A | Discharge status: Home / Self Care | Payer: 59 | Source: Ambulatory Visit | Attending: General Surgery | Admitting: General Surgery

## 2019-01-05 ENCOUNTER — Encounter (HOSPITAL_COMMUNITY)
Admission: RE | Admit: 2019-01-05 | Discharge: 2019-01-05 | Disposition: A | Discharge status: Home / Self Care | Payer: 59 | Source: Ambulatory Visit | Attending: General Surgery | Admitting: General Surgery

## 2019-01-05 ENCOUNTER — Ambulatory Visit (HOSPITAL_BASED_OUTPATIENT_CLINIC_OR_DEPARTMENT_OTHER)
Admission: RE | Admit: 2019-01-05 | Discharge: 2019-01-05 | Disposition: A | Discharge status: Home / Self Care | Payer: 59 | Attending: General Surgery | Admitting: General Surgery

## 2019-01-05 DIAGNOSIS — C50411 Malignant neoplasm of upper-outer quadrant of right female breast: Secondary | ICD-10-CM | POA: Insufficient documentation

## 2019-01-05 DIAGNOSIS — Z9221 Personal history of antineoplastic chemotherapy: Secondary | ICD-10-CM | POA: Diagnosis not present

## 2019-01-05 DIAGNOSIS — Z803 Family history of malignant neoplasm of breast: Secondary | ICD-10-CM | POA: Insufficient documentation

## 2019-01-05 DIAGNOSIS — Z79899 Other long term (current) drug therapy: Secondary | ICD-10-CM | POA: Insufficient documentation

## 2019-01-05 DIAGNOSIS — Z171 Estrogen receptor negative status [ER-]: Secondary | ICD-10-CM | POA: Insufficient documentation

## 2019-01-05 DIAGNOSIS — Z791 Long term (current) use of non-steroidal anti-inflammatories (NSAID): Secondary | ICD-10-CM | POA: Insufficient documentation

## 2019-01-05 DIAGNOSIS — C50911 Malignant neoplasm of unspecified site of right female breast: Secondary | ICD-10-CM

## 2019-01-05 HISTORY — PX: BREAST LUMPECTOMY WITH RADIOACTIVE SEED AND SENTINEL LYMPH NODE BIOPSY: SHX6550

## 2019-01-05 SURGERY — BREAST LUMPECTOMY WITH RADIOACTIVE SEED AND SENTINEL LYMPH NODE BIOPSY
Anesthesia: General | Site: Breast | Laterality: Right

## 2019-01-05 MED ORDER — TECHNETIUM TC 99M SULFUR COLLOID FILTERED
1.0000 | Freq: Once | INTRAVENOUS | Status: AC | PRN
  Administered 2019-01-05: 1 via INTRADERMAL

## 2019-01-05 MED ORDER — ONDANSETRON HCL 4 MG/2ML IJ SOLN
INTRAMUSCULAR | Status: DC | PRN
  Administered 2019-01-05: 4 mg via INTRAVENOUS

## 2019-01-05 MED ORDER — EPHEDRINE 5 MG/ML INJ
INTRAVENOUS | Status: AC
  Filled 2019-01-05: qty 10

## 2019-01-05 MED ORDER — EPHEDRINE SULFATE-NACL 50-0.9 MG/10ML-% IV SOSY
PREFILLED_SYRINGE | INTRAVENOUS | Status: DC | PRN
  Administered 2019-01-05 (×2): 10 mg via INTRAVENOUS

## 2019-01-05 MED ORDER — OXYCODONE HCL 5 MG PO TABS: 5.0000 mg | ORAL_TABLET | Freq: Four times a day (QID) | ORAL | 0 refills | Status: DC | PRN

## 2019-01-05 MED ORDER — BUPIVACAINE HCL 0.5 % IJ SOLN
INTRAMUSCULAR | Status: DC | PRN
  Administered 2019-01-05: 20 mL

## 2019-01-05 MED ORDER — OXYCODONE HCL 5 MG PO TABS: 5.0000 mg | ORAL_TABLET | Freq: Once | ORAL | Status: DC | PRN

## 2019-01-05 MED ORDER — PHENYLEPHRINE 40 MCG/ML (10ML) SYRINGE FOR IV PUSH (FOR BLOOD PRESSURE SUPPORT)
PREFILLED_SYRINGE | INTRAVENOUS | Status: AC
  Filled 2019-01-05: qty 10

## 2019-01-05 MED ORDER — CHLORHEXIDINE GLUCONATE CLOTH 2 % EX PADS: 6.0000 | MEDICATED_PAD | Freq: Once | CUTANEOUS | Status: DC

## 2019-01-05 MED ORDER — ROPIVACAINE HCL 7.5 MG/ML IJ SOLN
INTRAMUSCULAR | Status: DC | PRN
  Administered 2019-01-05: 25 mL via PERINEURAL

## 2019-01-05 MED ORDER — ACETAMINOPHEN 160 MG/5ML PO SOLN: 325.0000 mg | ORAL | Status: DC | PRN

## 2019-01-05 MED ORDER — GABAPENTIN 300 MG PO CAPS
300.0000 mg | ORAL_CAPSULE | ORAL | Status: AC
  Administered 2019-01-05: 300 mg via ORAL

## 2019-01-05 MED ORDER — CELECOXIB 200 MG PO CAPS
ORAL_CAPSULE | ORAL | Status: AC
  Filled 2019-01-05: qty 1

## 2019-01-05 MED ORDER — SCOPOLAMINE 1 MG/3DAYS TD PT72: 1.0000 | MEDICATED_PATCH | Freq: Once | TRANSDERMAL | Status: DC | PRN

## 2019-01-05 MED ORDER — CEFAZOLIN SODIUM-DEXTROSE 2-4 GM/100ML-% IV SOLN
INTRAVENOUS | Status: AC
  Filled 2019-01-05: qty 100

## 2019-01-05 MED ORDER — SODIUM CHLORIDE (PF) 0.9 % IJ SOLN
INTRAMUSCULAR | Status: AC
  Filled 2019-01-05: qty 10

## 2019-01-05 MED ORDER — FENTANYL CITRATE (PF) 100 MCG/2ML IJ SOLN
INTRAMUSCULAR | Status: AC
  Filled 2019-01-05: qty 2

## 2019-01-05 MED ORDER — FENTANYL CITRATE (PF) 100 MCG/2ML IJ SOLN: 25.0000 ug | INTRAMUSCULAR | Status: DC | PRN

## 2019-01-05 MED ORDER — BUPIVACAINE HCL (PF) 0.5 % IJ SOLN
INTRAMUSCULAR | Status: AC
  Filled 2019-01-05: qty 30

## 2019-01-05 MED ORDER — PROPOFOL 10 MG/ML IV BOLUS
INTRAVENOUS | Status: DC | PRN
  Administered 2019-01-05: 150 mg via INTRAVENOUS
  Administered 2019-01-05: 50 mg via INTRAVENOUS

## 2019-01-05 MED ORDER — BUPIVACAINE LIPOSOME 1.3 % IJ SUSP: 20.0000 mL | Freq: Once | INTRAMUSCULAR | Status: DC

## 2019-01-05 MED ORDER — ONDANSETRON HCL 4 MG/2ML IJ SOLN: 4.0000 mg | Freq: Once | INTRAMUSCULAR | Status: DC | PRN

## 2019-01-05 MED ORDER — ACETAMINOPHEN 500 MG PO TABS
ORAL_TABLET | ORAL | Status: AC
  Filled 2019-01-05: qty 2

## 2019-01-05 MED ORDER — LIDOCAINE 2% (20 MG/ML) 5 ML SYRINGE
INTRAMUSCULAR | Status: DC | PRN
  Administered 2019-01-05: 60 mg via INTRAVENOUS

## 2019-01-05 MED ORDER — CELECOXIB 200 MG PO CAPS
200.0000 mg | ORAL_CAPSULE | ORAL | Status: AC
  Administered 2019-01-05: 200 mg via ORAL

## 2019-01-05 MED ORDER — OXYCODONE HCL 5 MG/5ML PO SOLN: 5.0000 mg | Freq: Once | ORAL | Status: DC | PRN

## 2019-01-05 MED ORDER — PHENYLEPHRINE 40 MCG/ML (10ML) SYRINGE FOR IV PUSH (FOR BLOOD PRESSURE SUPPORT)
PREFILLED_SYRINGE | INTRAVENOUS | Status: DC | PRN
  Administered 2019-01-05 (×4): 80 ug via INTRAVENOUS

## 2019-01-05 MED ORDER — BUPIVACAINE HCL (PF) 0.25 % IJ SOLN
INTRAMUSCULAR | Status: AC
  Filled 2019-01-05: qty 30

## 2019-01-05 MED ORDER — GABAPENTIN 300 MG PO CAPS
ORAL_CAPSULE | ORAL | Status: AC
  Filled 2019-01-05: qty 1

## 2019-01-05 MED ORDER — ONDANSETRON HCL 4 MG/2ML IJ SOLN
INTRAMUSCULAR | Status: AC
  Filled 2019-01-05: qty 2

## 2019-01-05 MED ORDER — MIDAZOLAM HCL 2 MG/2ML IJ SOLN
INTRAMUSCULAR | Status: AC
  Filled 2019-01-05: qty 2

## 2019-01-05 MED ORDER — LIDOCAINE 2% (20 MG/ML) 5 ML SYRINGE
INTRAMUSCULAR | Status: AC
  Filled 2019-01-05: qty 5

## 2019-01-05 MED ORDER — MIDAZOLAM HCL 2 MG/2ML IJ SOLN
1.0000 mg | INTRAMUSCULAR | Status: DC | PRN
  Administered 2019-01-05: 2 mg via INTRAVENOUS

## 2019-01-05 MED ORDER — CEFAZOLIN SODIUM-DEXTROSE 2-4 GM/100ML-% IV SOLN
2.0000 g | INTRAVENOUS | Status: AC
  Administered 2019-01-05: 2 g via INTRAVENOUS

## 2019-01-05 MED ORDER — FENTANYL CITRATE (PF) 100 MCG/2ML IJ SOLN
50.0000 ug | INTRAMUSCULAR | Status: DC | PRN
  Administered 2019-01-05 (×2): 50 ug via INTRAVENOUS

## 2019-01-05 MED ORDER — LACTATED RINGERS IV SOLN
INTRAVENOUS | Status: DC
  Administered 2019-01-05 (×2): via INTRAVENOUS

## 2019-01-05 MED ORDER — CLONIDINE HCL (ANALGESIA) 100 MCG/ML EP SOLN
EPIDURAL | Status: DC | PRN
  Administered 2019-01-05: 50 ug

## 2019-01-05 MED ORDER — DEXAMETHASONE SODIUM PHOSPHATE 10 MG/ML IJ SOLN
INTRAMUSCULAR | Status: AC
  Filled 2019-01-05: qty 1

## 2019-01-05 MED ORDER — METHYLENE BLUE 0.5 % INJ SOLN
INTRAVENOUS | Status: AC
  Filled 2019-01-05: qty 10

## 2019-01-05 MED ORDER — ACETAMINOPHEN 325 MG PO TABS: 325.0000 mg | ORAL_TABLET | ORAL | Status: DC | PRN

## 2019-01-05 MED ORDER — DEXAMETHASONE SODIUM PHOSPHATE 4 MG/ML IJ SOLN
INTRAMUSCULAR | Status: DC | PRN
  Administered 2019-01-05: 10 mg via INTRAVENOUS

## 2019-01-05 MED ORDER — MEPERIDINE HCL 25 MG/ML IJ SOLN: 6.2500 mg | INTRAMUSCULAR | Status: DC | PRN

## 2019-01-05 MED ORDER — ACETAMINOPHEN 500 MG PO TABS
1000.0000 mg | ORAL_TABLET | ORAL | Status: AC
  Administered 2019-01-05: 1000 mg via ORAL

## 2019-01-05 MED FILL — oxyCODONE HCL 5 MG TABS: 5 | 3 days supply | Qty: 12 | Fill #0

## 2019-01-05 SURGICAL SUPPLY — 51 items
BINDER BREAST LRG (GAUZE/BANDAGES/DRESSINGS) ×2 IMPLANT
BINDER BREAST MEDIUM (GAUZE/BANDAGES/DRESSINGS) IMPLANT
BINDER BREAST XLRG (GAUZE/BANDAGES/DRESSINGS) IMPLANT
BINDER BREAST XXLRG (GAUZE/BANDAGES/DRESSINGS) ×2 IMPLANT
BLADE SURG 15 STRL LF DISP TIS (BLADE) ×1 IMPLANT
BLADE SURG 15 STRL SS (BLADE) ×1
CANISTER SUC SOCK COL 7IN (MISCELLANEOUS) IMPLANT
CANISTER SUCT 1200ML W/VALVE (MISCELLANEOUS) IMPLANT
CHLORAPREP W/TINT 26ML (MISCELLANEOUS) ×2 IMPLANT
CLIP VESOCCLUDE MED 6/CT (CLIP) IMPLANT
CLIP VESOCCLUDE SM WIDE 6/CT (CLIP) IMPLANT
COVER BACK TABLE 60X90IN (DRAPES) ×2 IMPLANT
COVER MAYO STAND STRL (DRAPES) ×2 IMPLANT
COVER PROBE W GEL 5X96 (DRAPES) ×2 IMPLANT
COVER WAND RF STERILE (DRAPES) IMPLANT
DECANTER SPIKE VIAL GLASS SM (MISCELLANEOUS) IMPLANT
DERMABOND ADVANCED (GAUZE/BANDAGES/DRESSINGS) ×1
DERMABOND ADVANCED .7 DNX12 (GAUZE/BANDAGES/DRESSINGS) ×1 IMPLANT
DEVICE DUBIN W/COMP PLATE 8390 (MISCELLANEOUS) ×2 IMPLANT
DRAPE LAPAROSCOPIC ABDOMINAL (DRAPES) ×2 IMPLANT
DRAPE UTILITY XL STRL (DRAPES) ×2 IMPLANT
ELECT COATED BLADE 2.86 ST (ELECTRODE) ×2 IMPLANT
ELECT REM PT RETURN 9FT ADLT (ELECTROSURGICAL) ×2
ELECTRODE REM PT RTRN 9FT ADLT (ELECTROSURGICAL) ×1 IMPLANT
GLOVE BIOGEL PI IND STRL 8 (GLOVE) ×2 IMPLANT
GLOVE BIOGEL PI INDICATOR 8 (GLOVE) ×2
GLOVE ECLIPSE 7.5 STRL STRAW (GLOVE) ×2 IMPLANT
GLOVE SURG SYN 8.0 (GLOVE) ×4 IMPLANT
GOWN STRL REIN XL XLG (GOWN DISPOSABLE) ×2 IMPLANT
GOWN STRL REUS W/ TWL LRG LVL3 (GOWN DISPOSABLE) ×1 IMPLANT
GOWN STRL REUS W/ TWL XL LVL3 (GOWN DISPOSABLE) ×1 IMPLANT
GOWN STRL REUS W/TWL LRG LVL3 (GOWN DISPOSABLE) ×1
GOWN STRL REUS W/TWL XL LVL3 (GOWN DISPOSABLE) ×1
ILLUMINATOR WAVEGUIDE N/F (MISCELLANEOUS) IMPLANT
KIT MARKER MARGIN INK (KITS) ×2 IMPLANT
NDL SAFETY ECLIPSE 18X1.5 (NEEDLE) ×1 IMPLANT
NEEDLE HYPO 18GX1.5 SHARP (NEEDLE) ×1
NEEDLE HYPO 25X1 1.5 SAFETY (NEEDLE) ×4 IMPLANT
NS IRRIG 1000ML POUR BTL (IV SOLUTION) IMPLANT
PACK BASIN DAY SURGERY FS (CUSTOM PROCEDURE TRAY) ×2 IMPLANT
PENCIL BUTTON HOLSTER BLD 10FT (ELECTRODE) ×2 IMPLANT
SHEET MEDIUM DRAPE 40X70 STRL (DRAPES) ×2 IMPLANT
SLEEVE SCD COMPRESS KNEE MED (MISCELLANEOUS) ×2 IMPLANT
SPONGE LAP 4X18 RFD (DISPOSABLE) ×2 IMPLANT
SUT MON AB 5-0 PS2 18 (SUTURE) ×2 IMPLANT
SUT VICRYL 3-0 CR8 SH (SUTURE) ×2 IMPLANT
SYR CONTROL 10ML LL (SYRINGE) ×4 IMPLANT
TOWEL GREEN STERILE FF (TOWEL DISPOSABLE) ×2 IMPLANT
TRAY FAXITRON CT DISP (TRAY / TRAY PROCEDURE) IMPLANT
TUBE CONNECTING 20X1/4 (TUBING) IMPLANT
YANKAUER SUCT BULB TIP NO VENT (SUCTIONS) IMPLANT

## 2019-01-05 NOTE — Transfer of Care (Signed)
Immediate Anesthesia Transfer of Care Note  Patient: Tara Rodriguez  Procedure(s) Performed: RIGHT BREAST LUMPECTOMY WITH RADIOACTIVE SEED AND SENTINEL LYMPH NODE BIOPSY (Right Breast)  Patient Location: PACU  Anesthesia Type:General and Regional  Level of Consciousness: sedated  Airway & Oxygen Therapy: Patient Spontanous Breathing and Patient connected to face mask oxygen  Post-op Assessment: Report given to RN and Post -op Vital signs reviewed and stable  Post vital signs: Reviewed and stable  Last Vitals:  Vitals Value Taken Time  BP    Temp    Pulse 79 01/05/2019 11:33 AM  Resp 16 01/05/2019 11:33 AM  SpO2 100 % 01/05/2019 11:33 AM  Vitals shown include unvalidated device data.  Last Pain:  Vitals:   01/05/19 0818  TempSrc: Oral         Complications: No apparent anesthesia complications

## 2019-01-05 NOTE — Discharge Instructions (Signed)
[place] Office Phone Number [phone]  BREAST BIOPSY/ LUMPECTOMY: POST OP INSTRUCTIONS  Always review your discharge instruction sheet given to you by the facility where your surgery was performed.  IF YOU HAVE DISABILITY OR FAMILY LEAVE FORMS, YOU MUST BRING THEM TO THE OFFICE FOR PROCESSING.  DO NOT GIVE THEM TO YOUR DOCTOR.  1. A prescription for pain medication may be given to you upon discharge.  Take your pain medication as prescribed, if needed.  If narcotic pain medicine is not needed, then you may take acetaminophen (Tylenol) or ibuprofen (Advil) as needed. 1,000mg  Tylenol and 200mg  Celebrex taken at 8:25am (NO Tylenol or Ibuprofen until 2:30pm if needed) 2. Take your usually prescribed medications unless otherwise directed 3. If you need a refill on your pain medication, please contact your pharmacy.  They will contact our office to request authorization.  Prescriptions will not be filled after 5pm or on week-ends. 4. You should eat very light the first 24 hours after surgery, such as soup, crackers, pudding, etc.  Resume your normal diet the day after surgery. 5. Most patients will experience some swelling and bruising in the breast.  Ice packs and a good support bra will help.  Swelling and bruising can take several days to resolve.  6. It is common to experience some constipation if taking pain medication after surgery.  Increasing fluid intake and taking a stool softener will usually help or prevent this problem from occurring.  A mild laxative (Milk of Magnesia or Miralax) should be taken according to package directions if there are no bowel movements after 48 hours. 7. Unless discharge instructions indicate otherwise, you may remove your bandages 24-48 hours after surgery, and you may shower at that time.  You may have steri-strips (small skin tapes) in place directly over the incision.  These strips should be left on the skin for 7-10 days.  If your surgeon used  skin glue on the incision, you may shower in 24 hours.  The glue will flake off over the next 2-3 weeks.  Any sutures or staples will be removed at the office during your follow-up visit. 8. ACTIVITIES:  You may resume regular daily activities (gradually increasing) beginning the next day.  Wearing a good support bra or sports bra minimizes pain and swelling.  You may have sexual intercourse when it is comfortable. a. You may drive when you no longer are taking prescription pain medication, you can comfortably wear a seatbelt, and you can safely maneuver your car and apply brakes. b. RETURN TO WORK:  ______________________________________________________________________________________ 9. You should see your doctor in the office for a follow-up appointment approximately two weeks after your surgery.  Your doctors nurse will typically make your follow-up appointment when she calls you with your pathology report.  Expect your pathology report 2-3 business days after your surgery.  You may call to check if you do not hear from [place] after three days. 10. OTHER INSTRUCTIONS: _______________________________________________________________________________________________ _____________________________________________________________________________________________________________________________________ _____________________________________________________________________________________________________________________________________ _____________________________________________________________________________________________________________________________________  WHEN TO CALL YOUR DOCTOR: 1. Fever over 101.0 2. Nausea and/or vomiting. 3. Extreme swelling or bruising. 4. Continued bleeding from incision. 5. Increased pain, redness, or drainage from the incision.  The clinic staff is available to answer your questions during regular business hours.  Please dont hesitate to call and ask to speak to one of  the nurses for clinical concerns.  If you have a medical emergency, go to the nearest emergency room or call 911.  A surgeon from [facility]  is always on call at the hospital.  For further questions, please visit [email or web address]    Post Anesthesia Home Care Instructions  Activity: Get plenty of rest for the remainder of the day. A responsible individual must stay with you for 24 hours following the procedure.  For the next 24 hours, DO NOT: -Drive a car -Paediatric nurse -Drink alcoholic beverages -Take any medication unless instructed by your physician -Make any legal decisions or sign important papers.  Meals: Start with liquid foods such as gelatin or soup. Progress to regular foods as tolerated. Avoid greasy, spicy, heavy foods. If nausea and/or vomiting occur, drink only clear liquids until the nausea and/or vomiting subsides. Call your physician if vomiting continues.  Special Instructions/Symptoms: Your throat may feel dry or sore from the anesthesia or the breathing tube placed in your throat during surgery. If this causes discomfort, gargle with warm salt water. The discomfort should disappear within 24 hours.  If you had a scopolamine patch placed behind your ear for the management of post- operative nausea and/or vomiting:  1. The medication in the patch is effective for 72 hours, after which it should be removed.  Wrap patch in a tissue and discard in the trash. Wash hands thoroughly with soap and water. 2. You may remove the patch earlier than 72 hours if you experience unpleasant side effects which may include dry mouth, dizziness or visual disturbances. 3. Avoid touching the patch. Wash your hands with soap and water after contact with the patch.

## 2019-01-05 NOTE — Anesthesia Procedure Notes (Signed)
Anesthesia Regional Block: Pectoralis block   Pre-Anesthetic Checklist: ,, timeout performed, Correct Patient, Correct Site, Correct Laterality, Correct Procedure, Correct Position, site marked, Risks and benefits discussed,  Surgical consent,  Pre-op evaluation,  At surgeon's request and post-op pain management  Laterality: Right  Prep: chloraprep       Needles:  Injection technique: Single-shot  Needle Type: Echogenic Stimulator Needle     Needle Length: 5cm  Needle Gauge: 22     Additional Needles:   Procedures:, nerve stimulator,,, ultrasound used (permanent image in chart),,,,   Nerve Stimulator or Paresthesia:  Response: 0.45 mA,   Additional Responses:   Narrative:  Start time: 01/05/2019 8:53 AM End time: 01/05/2019 8:57 AM Injection made incrementally with aspirations every 5 mL.  Performed by: Personally  Anesthesiologist: Janeece Riggers, MD  Additional Notes: Functioning IV was confirmed and monitors were applied.  A 15mm 22ga Arrow echogenic stimulator needle was used. Sterile prep and drape,hand hygiene and sterile gloves were used. Ultrasound guidance: relevant anatomy identified, needle position confirmed, local anesthetic spread visualized around nerve(s)., vascular puncture avoided.  Image printed for medical record. Negative aspiration and negative test dose prior to incremental administration of local anesthetic. The patient tolerated the procedure well.

## 2019-01-05 NOTE — Op Note (Signed)
Preoperative Diagnosis: RIGHT BREAST CANCER  Postoprative Diagnosis: RIGHT BREAST CANCER  Procedure: Procedure(s): RIGHT BREAST LUMPECTOMY WITH RADIOACTIVE SEED AND DEEP AXILLARY SENTINEL LYMPH NODE BIOPSY   Surgeon: Excell Seltzer T   Assistants: None  Anesthesia:  General LMA anesthesia  Indications: 48 year old female with a recent diagnosis of cancer of the right breast, upper outer quadrant. Clinical stage IIB, triple negative. Node negative clinically and by imaging on presentation. Chemotherapy stopped a little early due to peripheral neuropathy. Excellent response on MRI with near total resolution of the breast mass. No other abnormality seen.  Recommend proceeding with surgical treatment with radioactive seed localized right breast lumpectomy and axillary sentinel lymph node biopsy.  The procedure and indications and risks have been discussed, detailed elsewhere and she agrees to proceed.    Procedure Detail: Patient had previously undergone accurate placement of a radioactive seed at the tumor and clip site in the upper outer right breast.  In the holding area she underwent a pectoral block by anesthesia and underwent injection of 1 mCi of technetium sulfur colloid intradermally around the right nipple.  Seed placement was confirmed with the neoprobe in the holding area.  She was brought to the operating room, placed in the supine position on the operating table, and general endotracheal anesthesia induced.  I confirmed elevated counts in the axilla with the neoprobe.  The right breast and axilla and upper arm were widely sterilely prepped and draped.  She received preoperative IV antibiotics.  PAS were in place.  Patient timeout was performed and correct procedure verified. The lumpectomy was approached initially.  I used a circumareolar curvilinear incision laterally and dissection was carried down through the subcutaneous tissue and down into the breast parenchyma approaching the  posterior lesion and localizing with the neoprobe.  Using the neoprobe for guidance I then widen the dissection out around the area of high counts and this was taken down to near the chest wall excising an approximately 5 cm globular specimen of breast tissue around the seed.  The specimen was inked for margins.  Specimen x-ray showed the seed and marking clip and tumor contained within the specimen although somewhat closer to the medial margin.  I then completely expose the medial margin and took an additional 1 cm which was oriented and sent as a separate specimen.  I also took an additional posterior specimen which was down to the chest wall and this was inked and sent as a separate specimen.  Complete hemostasis was assured.  The lumpectomy cavity was marked with clips.  The deep breast and subcutaneous tissue was closed with interrupted 3-0 Vicryl. Attention was turned to the sentinel node.  I localized a hot area in the right axilla with the neoprobe and a transverse incision made in a skin fold and dissection carried down to the subcutaneous tissue.  Clavipectoral fascia was incised.  Using the neoprobe for guidance I dissected down onto a slightly enlarged soft deep  lymph node which was completely excised with cautery and ex vivo had counts of over 1000.  Further examination of the axilla with the neoprobe indicated another area of mildly elevated counts and I was able to dissect down onto and feel a slightly enlarged soft deep lymph node against the chest wall which was elevated and excised with cautery.  Ex vivo this had counts of about 150.  Careful exploration with the neoprobe throughout the axilla showed 0 counts at this point and careful palpation failed to reveal  any palpable lymph nodes.  Complete hemostasis was assured in this wound.  The deep axillary and subcutaneous tissue was closed with interrupted 3-0 Vicryl.  Skin at both incisions was closed with subcuticular 5-0 Monocryl and Dermabond.   Sponge needle and instrument counts were correct.    Findings: Above  Estimated Blood Loss:  Minimal         Drains: None  Blood Given: none          Specimens: #1 right breast lumpectomy margins inked   #2 further medial margin new margin inked   #3 further deep margin new margin inked #4 right deep axillary sentinel lymph nodes X 2        Complications:  * No complications entered in OR log *         Disposition: PACU - hemodynamically stable.         Condition: stable

## 2019-01-05 NOTE — Anesthesia Postprocedure Evaluation (Signed)
Anesthesia Post Note  Patient: Tara Rodriguez  Procedure(s) Performed: RIGHT BREAST LUMPECTOMY WITH RADIOACTIVE SEED AND SENTINEL LYMPH NODE BIOPSY (Right Breast)     Patient location during evaluation: PACU Anesthesia Type: General Level of consciousness: awake and alert Pain management: pain level controlled Vital Signs Assessment: post-procedure vital signs reviewed and stable Respiratory status: spontaneous breathing, nonlabored ventilation, respiratory function stable and patient connected to nasal cannula oxygen Cardiovascular status: blood pressure returned to baseline and stable Postop Assessment: no apparent nausea or vomiting Anesthetic complications: no    Last Vitals:  Vitals:   01/05/19 1133 01/05/19 1145  BP: 106/67 113/78  Pulse: 79 73  Resp: 16 15  Temp: 36.4 C   SpO2: 100% 100%    Last Pain:  Vitals:   01/05/19 1145  TempSrc:   PainSc: Asleep                 Jadden Yim

## 2019-01-05 NOTE — Anesthesia Procedure Notes (Signed)
Procedure Name: LMA Insertion Date/Time: 01/05/2019 9:54 AM Performed by: Lieutenant Diego, CRNA Pre-anesthesia Checklist: Patient identified, Emergency Drugs available, Suction available and Patient being monitored Patient Re-evaluated:Patient Re-evaluated prior to induction Oxygen Delivery Method: Circle system utilized Preoxygenation: Pre-oxygenation with 100% oxygen Induction Type: IV induction Ventilation: Mask ventilation without difficulty LMA: LMA inserted LMA Size: 4.0 Number of attempts: 1 Placement Confirmation: positive ETCO2 and breath sounds checked- equal and bilateral Tube secured with: Tape Dental Injury: Teeth and Oropharynx as per pre-operative assessment

## 2019-01-05 NOTE — Interval H&P Note (Signed)
History and Physical Interval Note:  01/05/2019 9:43 AM  Tara Rodriguez  has presented today for surgery, with the diagnosis of RIGHT BREAST CANCER  The various methods of treatment have been discussed with the patient and family. After consideration of risks, benefits and other options for treatment, the patient has consented to  Procedure(s): RIGHT BREAST LUMPECTOMY WITH RADIOACTIVE SEED AND SENTINEL LYMPH NODE BIOPSY (Right) as a surgical intervention .  The patient's history has been reviewed, patient examined, no change in status, stable for surgery.  I have reviewed the patient's chart and labs.  Questions were answered to the patient's satisfaction.     Darene Lamer Aarushi Hemric

## 2019-01-05 NOTE — Progress Notes (Signed)
Assisted Dr. Oddono with right, ultrasound guided, pectoralis block. Side rails up, monitors on throughout procedure. See vital signs in flow sheet. Tolerated Procedure well. 

## 2019-01-05 NOTE — Progress Notes (Signed)
[place] with Nuclear medication injection, time out performed and pt tolerated procedure well

## 2019-01-06 ENCOUNTER — Encounter (HOSPITAL_BASED_OUTPATIENT_CLINIC_OR_DEPARTMENT_OTHER): Payer: Self-pay | Admitting: General Surgery

## 2019-01-12 NOTE — Progress Notes (Signed)
Patient Care Team: Tysinger, Camelia Eng, PA-C as PCP - General (Family Medicine) Excell Seltzer, MD as Consulting Physician (General Surgery) Nicholas Lose, MD as Consulting Physician (Hematology and Oncology) Gery Pray, MD as Consulting Physician (Radiation Oncology)  DIAGNOSIS:    ICD-10-CM   1. Malignant neoplasm of upper-outer quadrant of right breast in female, estrogen receptor negative ([place]) C50.411    Z17.1     SUMMARY OF ONCOLOGIC HISTORY:   Malignant neoplasm of upper-outer quadrant of right breast in female, estrogen receptor negative ([place])   06/23/2018 Initial Diagnosis    Palpable lump in the right breast for 2 months UOQ by mammogram and ultrasound cystic and solid lesion at 10 o'clock position measuring 4.1 cm, axilla negative, biopsy revealed IDC grade 3 triple negative with a Ki-67 of 70%, T2 N0 stage IIb clinical stage    07/12/2018 - 11/22/2018 Neo-Adjuvant Chemotherapy    Neo adj chemo dose dense AC X 4 foll by Taxol and carboplatin     Genetic Testing    Negative genetic testing on the [facility] Common Hereditary Cancers Panel. The Common Hereditary Cancers Panel offered by [facility] includes sequencing and/or deletion duplication testing of the following 47 genes: APC, ATM, AXIN2, BARD1, BMPR1A, BRCA1, BRCA2, BRIP1, CDH1, CDKN2A (p14ARF), CDKN2A (p16INK4a), CKD4, CHEK2, CTNNA1, DICER1, EPCAM (Deletion/duplication testing only), GREM1 (promoter region deletion/duplication testing only), KIT, MEN1, MLH1, MSH2, MSH3, MSH6, MUTYH, NBN, NF1, NHTL1, PALB2, PDGFRA, PMS2, POLD1, POLE, PTEN, RAD50, RAD51C, RAD51D, SDHB, SDHC, SDHD, SMAD4, SMARCA4. STK11, TP53, TSC1, TSC2, and VHL.  The following genes were evaluated for sequence changes only: SDHA and HOXB13 c.251G>A variant only.  The report date is 07/23/2018.    01/05/2019 Surgery    Right lumpectomy: No evidence of residual carcinoma, margins negative, 0/2 lymph nodes negative, complete pathologic response      CHIEF  COMPLIANT: Follow-up s/p lumpectomy to review pathology  INTERVAL HISTORY: Tara Rodriguez is a 48 y.o. with above-mentioned history of right breast cancer who completed 4 cycles of neoadjuvant dose dense AC and 10 cycles of Taxol and had a lumpectomy on 01/05/19 for which pathology showed no evidence of residual triple negative carcinoma and no lymph node involvement. She presents to the clinic today with her son. She notes no improvement in neuropathy in her fingers and toes despite taking gabapentin twice daily. It improves when heat is applied. She questioned if she could go back to work and asked for a note indicating it was okay.  REVIEW OF SYSTEMS:   Constitutional: Denies fevers, chills or abnormal weight loss Eyes: Denies blurriness of vision Ears, nose, mouth, throat, and face: Denies mucositis or sore throat Respiratory: Denies cough, dyspnea or wheezes Cardiovascular: Denies palpitation, chest discomfort Gastrointestinal: Denies nausea, heartburn or change in bowel habits Skin: Denies abnormal skin rashes Lymphatics: Denies new lymphadenopathy or easy bruising Neurological: Denies new weaknesses (+) numbness, tingling in fingers and toes Behavioral/Psych: Mood is stable, no new changes  Extremities: No lower extremity edema Breast: denies any pain or lumps or nodules in either breasts  All other systems were reviewed with the patient and are negative.  I have reviewed the past medical history, past surgical history, social history and family history with the patient and they are unchanged from previous note.  ALLERGIES:  has No Known Allergies.  MEDICATIONS:  Current Outpatient Medications  Medication Sig Dispense Refill  . acetaminophen (TYLENOL) 500 MG tablet Take 500 mg by mouth every 6 (six) hours as needed for mild pain.     Marland Kitchen  gabapentin (NEURONTIN) 300 MG capsule Take 1 capsule (300 mg total) by mouth 2 (two) times daily. 60 capsule 3  . naproxen sodium (ALEVE) 220 MG tablet  Take 220 mg by mouth as needed (pain).     . ondansetron (ZOFRAN) 8 MG tablet Take 1 tablet (8 mg total) by mouth 2 (two) times daily as needed. Start on the third day after chemotherapy. 30 tablet 1  . oxyCODONE (OXY IR/ROXICODONE) 5 MG immediate release tablet Take 1 tablet (5 mg total) by mouth every 6 (six) hours as needed for moderate pain, severe pain or breakthrough pain. 12 tablet 0   No current facility-administered medications for this visit.     PHYSICAL EXAMINATION: ECOG PERFORMANCE STATUS: 1 - Symptomatic but completely ambulatory  Vitals:   01/18/19 1506  BP: 136/76  Pulse: 79  Resp: 18  Temp: 98.3 F (36.8 C)  SpO2: 100%   There were no vitals filed for this visit.  GENERAL: alert, no distress and comfortable SKIN: skin color, texture, turgor are normal, no rashes or significant lesions EYES: normal, Conjunctiva are pink and non-injected, sclera clear OROPHARYNX: no exudate, no erythema and lips, buccal mucosa, and tongue normal  NECK: supple, thyroid normal size, non-tender, without nodularity LYMPH: no palpable lymphadenopathy in the cervical, axillary or inguinal LUNGS: clear to auscultation and percussion with normal breathing effort HEART: regular rate & rhythm and no murmurs and no lower extremity edema ABDOMEN: abdomen soft, non-tender and normal bowel sounds MUSCULOSKELETAL: no cyanosis of digits and no clubbing  NEURO: alert & oriented x 3 with fluent speech, no focal motor/sensory deficits EXTREMITIES: No lower extremity edema  LABORATORY DATA:  I have reviewed the data as listed CMP Latest Ref Rng & Units 11/29/2018 11/22/2018 11/15/2018  Glucose 70 - 99 mg/dL 157(H) 149(H) 148(H)  BUN 6 - 20 mg/dL '10 7 7  '$ Creatinine 0.44 - 1.00 mg/dL 0.78 0.75 0.77  Sodium 135 - 145 mmol/L 141 140 142  Potassium 3.5 - 5.1 mmol/L 3.5 3.5 3.7  Chloride 98 - 111 mmol/L 108 107 109  CO2 22 - 32 mmol/L '25 26 25  '$ Calcium 8.9 - 10.3 mg/dL 9.0 9.0 8.8(L)  Total Protein  6.5 - 8.1 g/dL 6.5 6.7 6.5  Total Bilirubin 0.3 - 1.2 mg/dL 0.3 0.2(L) 0.3  Alkaline Phos 38 - 126 U/L 65 76 70  AST 15 - 41 U/L '18 16 26  '$ ALT 0 - 44 U/L 27 24 43    Lab Results  Component Value Date   WBC 3.1 (L) 11/29/2018   HGB 8.8 (L) 11/29/2018   HCT 26.9 (L) 11/29/2018   MCV 104.3 (H) 11/29/2018   PLT 91 (L) 11/29/2018   NEUTROABS 1.2 (L) 11/29/2018    ASSESSMENT & PLAN:  Malignant neoplasm of upper-outer quadrant of right breast in female, estrogen receptor negative ([facility]) 06/23/2018:Palpable lump in the right breast for 2 months UOQ by mammogram and ultrasound cystic and solid lesion at 10 o'clock position measuring 4.1 cm, axilla negative, biopsy revealed IDC grade 3 triple negative with a Ki-67 of 70%, T2 N0 stage IIb clinical stage 07/12/2018 to 11/22/2018 neoadjuvant chemotherapy with Adriamycin and Cytoxan dose dense 4 followed byTaxol and carboplatinweekly 11 01/05/2019: Right lumpectomy: No evidence of residual carcinoma, margins negative, 0/2 lymph nodes negative, complete pathologic response  Pathology counseling: I discussed the final pathology report of the patient provided  a copy of this report. I discussed the margins as well as lymph node surgeries. We also discussed the  final staging along with previously performed ER/PR and HER-2/neu testing.  Recommendation: Adjuvant radiation therapy followed by surveillance. Chemo-induced peripheral neuropathy: Being monitored, I increase the gabapentin to 300 mg 3 times daily I wrote a letter for her to return to work with restrictions until 01/30/2019 and after work no restrictions.  Patient works as a Technical brewer.  Return to clinic at the end of radiation therapy.    No orders of the defined types were placed in this encounter.  The patient has a good understanding of the overall plan. she agrees with it. she will call with any problems that may develop before the next visit here.  Nicholas Lose, MD 01/18/2019  Tara Rodriguez  Tara Rodriguez am acting as scribe for Dr. Nicholas Lose.  I have reviewed the above documentation for accuracy and completeness, and I agree with the above.

## 2019-01-18 ENCOUNTER — Inpatient Hospital Stay: Payer: 59 | Attending: Genetic Counselor | Admitting: Hematology and Oncology

## 2019-01-18 DIAGNOSIS — Z9221 Personal history of antineoplastic chemotherapy: Secondary | ICD-10-CM | POA: Diagnosis not present

## 2019-01-18 DIAGNOSIS — C50411 Malignant neoplasm of upper-outer quadrant of right female breast: Secondary | ICD-10-CM | POA: Diagnosis not present

## 2019-01-18 DIAGNOSIS — Z171 Estrogen receptor negative status [ER-]: Secondary | ICD-10-CM | POA: Diagnosis not present

## 2019-01-18 DIAGNOSIS — Z923 Personal history of irradiation: Secondary | ICD-10-CM | POA: Insufficient documentation

## 2019-01-18 DIAGNOSIS — T451X5D Adverse effect of antineoplastic and immunosuppressive drugs, subsequent encounter: Secondary | ICD-10-CM | POA: Insufficient documentation

## 2019-01-18 DIAGNOSIS — G620 Drug-induced polyneuropathy: Secondary | ICD-10-CM | POA: Insufficient documentation

## 2019-01-18 DIAGNOSIS — Z79899 Other long term (current) drug therapy: Secondary | ICD-10-CM | POA: Insufficient documentation

## 2019-01-18 NOTE — Assessment & Plan Note (Signed)
06/23/2018:Palpable lump in the right breast for 2 months UOQ by mammogram and ultrasound cystic and solid lesion at 10 o'clock position measuring 4.1 cm, axilla negative, biopsy revealed IDC grade 3 triple negative with a Ki-67 of 70%, T2 N0 stage IIb clinical stage 07/12/2018 to 11/22/2018 neoadjuvant chemotherapy with Adriamycin and Cytoxan dose dense 4 followed byTaxol and carboplatinweekly 11 01/05/2019: Right lumpectomy: No evidence of residual carcinoma, margins negative, 0/2 lymph nodes negative, complete pathologic response  Pathology counseling: I discussed the final pathology report of the patient provided  a copy of this report. I discussed the margins as well as lymph node surgeries. We also discussed the final staging along with previously performed ER/PR and HER-2/neu testing.  Recommendation: Adjuvant radiation therapy followed by surveillance. Chemo-induced peripheral neuropathy: Being monitored  Return to clinic at the end of radiation therapy.

## 2019-01-19 ENCOUNTER — Telehealth: Payer: Self-pay | Admitting: Hematology and Oncology

## 2019-01-19 ENCOUNTER — Ambulatory Visit
Admission: RE | Admit: 2019-01-19 | Discharge: 2019-01-19 | Disposition: A | Discharge status: Home / Self Care | Payer: 59 | Source: Ambulatory Visit | Attending: Radiation Oncology | Admitting: Radiation Oncology

## 2019-01-19 ENCOUNTER — Ambulatory Visit: Payer: 59

## 2019-01-19 ENCOUNTER — Ambulatory Visit: Admission: RE | Admit: 2019-01-19 | Payer: 59 | Source: Ambulatory Visit | Admitting: Radiation Oncology

## 2019-01-19 NOTE — Telephone Encounter (Signed)
No los °

## 2019-02-02 ENCOUNTER — Ambulatory Visit
Admission: RE | Admit: 2019-02-02 | Discharge: 2019-02-02 | Disposition: A | Discharge status: Home / Self Care | Payer: 59 | Source: Ambulatory Visit | Attending: Radiation Oncology | Admitting: Radiation Oncology

## 2019-02-02 ENCOUNTER — Encounter: Payer: Self-pay | Admitting: Radiation Oncology

## 2019-02-02 ENCOUNTER — Other Ambulatory Visit: Payer: Self-pay

## 2019-02-02 VITALS — BP 139/98 | HR 78 | Temp 98.3°F | Resp 18 | Ht 70.0 in | Wt 211.5 lb

## 2019-02-02 DIAGNOSIS — Z171 Estrogen receptor negative status [ER-]: Secondary | ICD-10-CM

## 2019-02-02 DIAGNOSIS — C50411 Malignant neoplasm of upper-outer quadrant of right female breast: Secondary | ICD-10-CM | POA: Insufficient documentation

## 2019-02-02 MED FILL — GABAPENTIN 300 MG CAPSULE: 300 | 30 days supply | Qty: 60 | Fill #1

## 2019-02-02 NOTE — Progress Notes (Signed)
Radiation Oncology         [phone] ________________________________  Name: Tara Rodriguez MRN: [id number]  Date: 02/02/2019  DOB: 15-Jan-1971  Re-Evaluation Note  CC: Tysinger, Camelia Eng, Gennette Pac, MD    ICD-10-CM   1. Malignant neoplasm of upper-outer quadrant of right breast in female, estrogen receptor negative ([place]) C50.411    Z17.1     Diagnosis:   Stage IIB (ypT0 ypN0 cM0) Right Breast UOQ Invasive Ductal Carcinoma, ER- / PR- / Her2-, Grade 3  Narrative:  The patient returns today for re-evaluation following definitive surgery. Since initial consultation in breast clinic on 06/30/2018, she underwent genetic testing on 07/23/2018 with negative results. She also received 4 cycles of neoadjuvant dose dense AC followed by 10 cycles of Taxol from 07/12/2018 - 11/22/2018 under Dr. Lindi Adie.   She proceeded to right breast lumpectomy on 01/05/2019, with pathology showing a complete pathologic response with no evidence of residual triple negative carcinoma and no lymph node involvement.  On review of systems, the patient reports feeling a lump in her right breast, tenderness to the breast, and peripheral neuropathy to her hands and feet from chemotherapy. She states the lump developed since her visit with Dr. Lindi Adie on 01/18/2019. She reports her breast still feels swollen. She denies any lymphedema.   ALLERGIES:  has No Known Allergies.  Meds: Current Outpatient Medications  Medication Sig Dispense Refill  . acetaminophen (TYLENOL) 500 MG tablet Take 500 mg by mouth every 6 (six) hours as needed for mild pain.     Marland Kitchen gabapentin (NEURONTIN) 300 MG capsule Take 1 capsule (300 mg total) by mouth 2 (two) times daily. (Patient taking differently: Take 300 mg by mouth 3 (three) times daily. ) 60 capsule 3  . naproxen sodium (ALEVE) 220 MG tablet Take 220 mg by mouth as needed (pain).     Marland Kitchen oxyCODONE (OXY IR/ROXICODONE) 5 MG immediate release tablet Take 1 tablet (5 mg total) by mouth  every 6 (six) hours as needed for moderate pain, severe pain or breakthrough pain. 12 tablet 0  . ondansetron (ZOFRAN) 8 MG tablet Take 1 tablet (8 mg total) by mouth 2 (two) times daily as needed. Start on the third day after chemotherapy. (Patient not taking: Reported on 02/02/2019) 30 tablet 1   No current facility-administered medications for this encounter.     Physical Findings: The patient is in no acute distress. Patient is alert and oriented.  height is '5\' 10"'$  (1.778 m) and weight is 211 lb 8 oz (95.9 kg). Her oral temperature is 98.3 F (36.8 C). Her blood pressure is 139/98 (abnormal) and her pulse is 78. Her respiration is 18 and oxygen saturation is 100%. .  No significant changes. Lungs are clear to auscultation bilaterally. Heart has regular rate and rhythm. No palpable cervical, supraclavicular, or axillary adenopathy. Abdomen soft, non-tender, normal bowel sounds. Breasts: left with no palpable mass, nipple discharge or bleeding; right with periareolar scar in the upper outer aspect of the breast, which has healed well and is barely detectable. Patient has some induration in the upper outer aspect of the right breast measuring approximately 5 x 6 cm consistent with seroma. No signs of infection in the breast. Patient had a separate scar in the axillary region from her lymph node  surgery, which has also healed well.  Lab Findings: Lab Results  Component Value Date   WBC 3.1 (L) 11/29/2018   HGB 8.8 (L) 11/29/2018   HCT 26.9 (L)  11/29/2018   MCV 104.3 (H) 11/29/2018   PLT 91 (L) 11/29/2018    Radiographic Findings: Nm Sentinel Node Inj-no Rpt (breast)  Result Date: 01/05/2019 Sulfur colloid was injected by the nuclear medicine technologist for melanoma sentinel node.   Mm Breast Surgical Specimen  Result Date: 01/05/2019 CLINICAL DATA:  Post right breast lumpectomy. EXAM: SPECIMEN RADIOGRAPH OF THE RIGHT BREAST COMPARISON:  Previous exam(s). FINDINGS: Status post excision of  the right breast. The radioactive seed and biopsy marker clip are present, completely intact, and were marked for pathology. IMPRESSION: Specimen radiograph of the right breast. Electronically Signed   By: Fidela Salisbury M.D.   On: 01/05/2019 10:42   Mm Rt Radioactive Seed Loc Mammo Guide  Result Date: 01/04/2019 CLINICAL DATA:  History of RIGHT breast cancer status post neoadjuvant therapy, now scheduled for breast conservation surgery requiring preoperative radioactive seed localization. EXAM: MAMMOGRAPHIC GUIDED RADIOACTIVE SEED LOCALIZATION OF THE RIGHT BREAST COMPARISON:  Previous exam(s). FINDINGS: Patient presents for radioactive seed localization prior to breast conservation surgery. I met with the patient and we discussed the procedure of seed localization including benefits and alternatives. We discussed the high likelihood of a successful procedure. We discussed the risks of the procedure including infection, bleeding, tissue injury and further surgery. We discussed the low dose of radioactivity involved in the procedure. Informed, written consent was given. The usual time-out protocol was performed immediately prior to the procedure. Using mammographic guidance, sterile technique, 1% lidocaine and an I-125 radioactive seed, the coil shaped clip within the outer RIGHT breast was localized using a lateral approach. The follow-up mammogram images confirm the seed in the expected location and were marked for Dr. Excell Seltzer. Follow-up survey of the patient confirms presence of the radioactive seed. Order number of I-125 seed:  [id number]. Total activity:  1.191 millicuries reference Date: 12/31/2018 The patient tolerated the procedure well and was released from the [facility]. She was given instructions regarding seed removal. IMPRESSION: Radioactive seed localization right breast. No apparent complications. Electronically Signed   By: Franki Cabot M.D.   On: 01/04/2019 15:40    Impression: Stage  IIB (ypT0 ypN0 cM0) Right Breast UOQ Invasive Ductal Carcinoma, ER- / PR- / Her2-, Grade 3 We discussed the clinical significance of her complete pathologic response, and she is happy about this finding. The patient appears to have a seroma in the breast, which does not appear to be bothering her at this time. We will get an approximate size estimate during her CT planning tomorrow.  Today, I talked to the patient  about the findings and work-up thus far.  We discussed the natural history of breast cancer and general treatment, highlighting the role of radiotherapy in the management.  We discussed the available radiation techniques, and focused on the details of logistics and delivery.  We reviewed the anticipated acute and late sequelae associated with radiation in this setting.  The patient was encouraged to ask questions that I answered to the best of my ability.  A patient consent form was discussed and signed.  We retained a copy for our records.  The patient would like to proceed with radiation and will be scheduled for CT simulation.  Plan:  CT simulation scheduled for tomorrow, 02/03/2019, at 1 pm. Anticipate 6.5 weeks of radiation therapy. Patient is not a candidate for hypofractionated treatment given her large pendulous breast size.  ____________________________________ -----------------------------------  Blair Promise, PhD, MD  This document serves as a record of services personally performed by  Gery Pray, MD. It was created on his behalf by [name], a trained medical scribe. The creation of this record is based on the scribe's personal observations and the provider's statements to them. This document has been checked and approved by the attending provider.

## 2019-02-02 NOTE — Progress Notes (Signed)
Location of Breast Cancer: Malignant neoplasm of upper-outer quadrant of right breast in female, estrogen receptor negative ([place])  Histology per Pathology Report: 06/23/18:  Diagnosis Breast, right, needle core biopsy, upper outer, 10 o'clock position - INVASIVE DUCTAL CARCINOMA, SEE COMMENT. Microscopic Comment The carcinoma appears grade 3.  Receptor Status: ER(negative), PR (negative), Her2-neu (negative), Ki-(70%)  Did patient present with symptoms (if so, please note symptoms) or was this found on screening mammography?: She initially presented with a palpable right lump x 2 months.   She underwent bilateral diagnostic mammography with tomography and right breast ultrasonography at [facility] on 06/23/2018 showing: Breast density category B. Cystic and solid mass versus a complicated cyst in the right breast at 10 o'clock.   Accordingly on 06/23/2018 she proceeded to biopsy of the right breast area in question. The pathology from this procedure showed: Breast, right, needle core biopsy, upper outer, 10 o'clock position with invasive ductal carcinoma. Prognostic indicators significant for: estrogen receptor, 0% negative and progesterone receptor, 0% negative. Proliferation marker Ki67 at 70%. HER2 negative.   On review of systems, She reports a lump noted to her right breast.  Past/Anticipated interventions by surgeon, if any: 01/05/19: Procedure: Procedure(s): RIGHT BREAST LUMPECTOMY WITH RADIOACTIVE SEED AND DEEP AXILLARY SENTINEL LYMPH NODE BIOPSY   Surgeon: Excell Seltzer T     Past/Anticipated interventions by medical oncology, if any: Chemotherapy Per Dr. Lindi Adie 01/18/19:  06/23/2018:Palpable lump in the right breast for 2 months UOQ by mammogram and ultrasound cystic and solid lesion at 10 o'clock position measuring 4.1 cm, axilla negative, biopsy revealed IDC grade 3 triple negative with a Ki-67 of 70%, T2 N0 stage IIb clinical stage 07/12/2018 to 11/22/2018 neoadjuvant  chemotherapy with Adriamycin and Cytoxan dose dense 4 followed byTaxol and carboplatinweekly 11 01/05/2019: Right lumpectomy: No evidence of residual carcinoma, margins negative, 0/2 lymph nodes negative, complete pathologic response  Pathology counseling: I discussed the final pathology report of the patient provided  a copy of this report. I discussed the margins as well as lymph node surgeries. We also discussed the final staging along with previously performed ER/PR and HER-2/neu testing.  Recommendation: Adjuvant radiation therapy followed by surveillance. Chemo-induced peripheral neuropathy: Being monitored  Return to clinic at the end of radiation therapy.  Lymphedema issues, if any:  Pt denies s/s of lymphedema. Pt does state she has a lump in her breast.  Pain issues, if any:  Pt reports breast is still tender to touch. Pt c/o peripheral neuropathy from chemotherapy.  SAFETY ISSUES:  Prior radiation? No  Pacemaker/ICD? No  Possible current pregnancy? No  Is the patient on methotrexate? No  Current Complaints / other details:  Pt presents today for initial consult with Dr. Sondra Come for Radiation Oncology. Pt is unaccompanied.   BP (!) 139/98 (BP Location: Left Arm, Patient Position: Sitting)   Pulse 78   Temp 98.3 F (36.8 C) (Oral)   Resp 18   Ht _0  (1.778 m)   Wt 211 lb 8 oz (95.9 kg)   SpO2 100%   BMI 30.35 kg/m   Wt Readings from Last 3 Encounters:  02/02/19 211 lb 8 oz (95.9 kg)  01/05/19 206 lb 9.1 oz (93.7 kg)  11/29/18 212 lb (96.2 kg)       Loma Sousa, RN 02/02/2019,2:57 PM

## 2019-02-03 ENCOUNTER — Ambulatory Visit
Admission: RE | Admit: 2019-02-03 | Discharge: 2019-02-03 | Disposition: A | Discharge status: Home / Self Care | Payer: 59 | Source: Ambulatory Visit | Attending: Radiation Oncology | Admitting: Radiation Oncology

## 2019-02-03 DIAGNOSIS — C50411 Malignant neoplasm of upper-outer quadrant of right female breast: Secondary | ICD-10-CM

## 2019-02-03 DIAGNOSIS — Z171 Estrogen receptor negative status [ER-]: Secondary | ICD-10-CM

## 2019-02-03 NOTE — Progress Notes (Signed)
  Radiation Oncology         [phone] ________________________________  Name: Tara Rodriguez MRN: [id number]  Date: 02/03/2019  DOB: 1971-03-26  SIMULATION AND TREATMENT PLANNING NOTE    ICD-10-CM   1. Malignant neoplasm of upper-outer quadrant of right breast in female, estrogen receptor negative ([place]) C50.411    Z17.1     DIAGNOSIS:  StageIIB (ypT0 ypN0 cM0) RightBreast UOQ Invasive Ductal Carcinoma, ER-/ PR-/ Her2-, Grade3  NARRATIVE:  The patient was brought to the [facility].  Identity was confirmed.  All relevant records and images related to the planned course of therapy were reviewed.  The patient freely provided informed written consent to proceed with treatment after reviewing the details related to the planned course of therapy. The consent form was witnessed and verified by the simulation staff.  Then, the patient was set-up in a stable reproducible  supine position for radiation therapy.  CT images were obtained.  Surface markings were placed.  The CT images were loaded into the planning software.  Then the target and avoidance structures were contoured.  Treatment planning then occurred.  The radiation prescription was entered and confirmed.  Then, I designed and supervised the construction of a total of 3 medically necessary complex treatment devices.  I have requested : 3D Simulation  I have requested a DVH of the following structures: heart, lungs, lumpectomy cavity.  I have ordered:dose calc.  PLAN:  The patient will receive 50.4 Gy in 28 fractions directed at the right breast followed by a boost to the lumpectomy cavity of 10 gray in 5 fractions. Patient is not a candidate for hypofractionated treatment given her large pendulous breast size.   Optical Surface Tracking Plan:  Since intensity modulated radiotherapy (IMRT) and 3D conformal radiation treatment methods are predicated on accurate and precise positioning for treatment, intrafraction motion  monitoring is medically necessary to ensure accurate and safe treatment delivery.  The ability to quantify intrafraction motion without excessive ionizing radiation dose can only be performed with optical surface tracking. Accordingly, surface imaging offers the opportunity to obtain 3D measurements of patient position throughout IMRT and 3D treatments without excessive radiation exposure.  I am ordering optical surface tracking for this patient's upcoming course of radiotherapy. ________________________________  -----------------------------------  Blair Promise, PhD, MD  This document serves as a record of services personally performed by Gery Pray, MD. It was created on his behalf by [name], a trained medical scribe. The creation of this record is based on the scribe's personal observations and the provider's statements to them. This document has been checked and approved by the attending provider.

## 2019-02-06 ENCOUNTER — Emergency Department (HOSPITAL_COMMUNITY): Payer: No Typology Code available for payment source

## 2019-02-06 ENCOUNTER — Emergency Department (HOSPITAL_COMMUNITY)
Admission: EM | Admit: 2019-02-06 | Discharge: 2019-02-06 | Disposition: A | Discharge status: Home / Self Care | Payer: No Typology Code available for payment source | Attending: Emergency Medicine | Admitting: Emergency Medicine

## 2019-02-06 ENCOUNTER — Encounter (HOSPITAL_COMMUNITY): Payer: Self-pay | Admitting: Emergency Medicine

## 2019-02-06 DIAGNOSIS — R109 Unspecified abdominal pain: Secondary | ICD-10-CM | POA: Insufficient documentation

## 2019-02-06 DIAGNOSIS — Y998 Other external cause status: Secondary | ICD-10-CM | POA: Insufficient documentation

## 2019-02-06 DIAGNOSIS — T07XXXA Unspecified multiple injuries, initial encounter: Secondary | ICD-10-CM | POA: Diagnosis present

## 2019-02-06 DIAGNOSIS — Y92410 Unspecified street and highway as the place of occurrence of the external cause: Secondary | ICD-10-CM | POA: Diagnosis not present

## 2019-02-06 DIAGNOSIS — Z853 Personal history of malignant neoplasm of breast: Secondary | ICD-10-CM | POA: Insufficient documentation

## 2019-02-06 DIAGNOSIS — Z9221 Personal history of antineoplastic chemotherapy: Secondary | ICD-10-CM | POA: Diagnosis not present

## 2019-02-06 DIAGNOSIS — Y9389 Activity, other specified: Secondary | ICD-10-CM | POA: Insufficient documentation

## 2019-02-06 LAB — CBC WITH DIFFERENTIAL/PLATELET
Abs Immature Granulocytes: 0.03 10*3/uL (ref 0.00–0.07)
BASOS ABS: 0 10*3/uL (ref 0.0–0.1)
Basophils Relative: 1 %
Eosinophils Absolute: 0.1 10*3/uL (ref 0.0–0.5)
Eosinophils Relative: 1 %
HCT: 37 % (ref 36.0–46.0)
Hemoglobin: 11.9 g/dL — ABNORMAL LOW (ref 12.0–15.0)
Immature Granulocytes: 1 %
Lymphocytes Relative: 52 %
Lymphs Abs: 3.1 10*3/uL (ref 0.7–4.0)
MCH: 31.6 pg (ref 26.0–34.0)
MCHC: 32.2 g/dL (ref 30.0–36.0)
MCV: 98.1 fL (ref 80.0–100.0)
Monocytes Absolute: 0.6 10*3/uL (ref 0.1–1.0)
Monocytes Relative: 11 %
Neutro Abs: 2 10*3/uL (ref 1.7–7.7)
Neutrophils Relative %: 34 %
Platelets: 237 10*3/uL (ref 150–400)
RBC: 3.77 MIL/uL — ABNORMAL LOW (ref 3.87–5.11)
RDW: 13.1 % (ref 11.5–15.5)
WBC: 5.8 10*3/uL (ref 4.0–10.5)
nRBC: 0 % (ref 0.0–0.2)

## 2019-02-06 LAB — COMPREHENSIVE METABOLIC PANEL
ALT: 32 U/L (ref 0–44)
AST: 27 U/L (ref 15–41)
Albumin: 3.8 g/dL (ref 3.5–5.0)
Alkaline Phosphatase: 77 U/L (ref 38–126)
Anion gap: 9 (ref 5–15)
BUN: 11 mg/dL (ref 6–20)
CO2: 23 mmol/L (ref 22–32)
Calcium: 9 mg/dL (ref 8.9–10.3)
Chloride: 107 mmol/L (ref 98–111)
Creatinine, Ser: 0.79 mg/dL (ref 0.44–1.00)
GFR calc Af Amer: >60 mL/min (ref >60)
GFR calc non Af Amer: >60 mL/min (ref >60)
Glucose, Bld: 128 mg/dL — ABNORMAL HIGH (ref 70–99)
Potassium: 3 mmol/L — ABNORMAL LOW (ref 3.5–5.1)
Sodium: 139 mmol/L (ref 135–145)
Total Bilirubin: 0.5 mg/dL (ref 0.3–1.2)
Total Protein: 6.8 g/dL (ref 6.5–8.1)

## 2019-02-06 LAB — SAMPLE TO BLOOD BANK

## 2019-02-06 MED ORDER — ONDANSETRON 4 MG PO TBDP: 4.0000 mg | ORAL_TABLET | Freq: Four times a day (QID) | ORAL | 0 refills | Status: DC | PRN

## 2019-02-06 MED ORDER — ONDANSETRON HCL 4 MG/2ML IJ SOLN
4.0000 mg | Freq: Once | INTRAMUSCULAR | Status: AC
  Administered 2019-02-06: 4 mg via INTRAVENOUS
  Filled 2019-02-06: qty 2

## 2019-02-06 MED ORDER — IOHEXOL 300 MG/ML  SOLN
100.0000 mL | Freq: Once | INTRAMUSCULAR | Status: AC | PRN
  Administered 2019-02-06: 100 mL via INTRAVENOUS

## 2019-02-06 MED ORDER — IBUPROFEN 800 MG PO TABS: 800.0000 mg | ORAL_TABLET | Freq: Three times a day (TID) | ORAL | 0 refills | Status: DC | PRN

## 2019-02-06 MED ORDER — HYDROCODONE-ACETAMINOPHEN 5-325 MG PO TABS: 2.0000 | ORAL_TABLET | Freq: Four times a day (QID) | ORAL | 0 refills | Status: DC | PRN

## 2019-02-06 MED ORDER — MORPHINE SULFATE (PF) 4 MG/ML IV SOLN
4.0000 mg | Freq: Once | INTRAVENOUS | Status: AC
  Administered 2019-02-06: 4 mg via INTRAVENOUS
  Filled 2019-02-06: qty 1
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
## 2019-02-06 NOTE — ED Notes (Signed)
Patient transported to X-ray 
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
## 2019-02-09 ENCOUNTER — Telehealth: Payer: Self-pay | Admitting: Hematology and Oncology

## 2019-02-09 NOTE — Telephone Encounter (Signed)
Left message - scheduled appt per 3/10 sch message - unable to reach pt sent reminder letter in the mail.

## 2019-02-14 ENCOUNTER — Other Ambulatory Visit: Payer: Self-pay

## 2019-02-14 ENCOUNTER — Ambulatory Visit
Admission: RE | Admit: 2019-02-14 | Discharge: 2019-02-14 | Disposition: A | Discharge status: Home / Self Care | Payer: 59 | Source: Ambulatory Visit | Attending: Radiation Oncology | Admitting: Radiation Oncology

## 2019-02-14 DIAGNOSIS — Z171 Estrogen receptor negative status [ER-]: Secondary | ICD-10-CM

## 2019-02-14 DIAGNOSIS — C50411 Malignant neoplasm of upper-outer quadrant of right female breast: Secondary | ICD-10-CM

## 2019-02-14 NOTE — Progress Notes (Signed)
  Radiation Oncology         [phone] ________________________________  Name: Tara Rodriguez MRN: [id number]  Date: 02/14/2019  DOB: 02-21-1971  Simulation Verification Note    ICD-10-CM   1. Malignant neoplasm of upper-outer quadrant of right breast in female, estrogen receptor negative ([place]) C50.411    Z17.1     Status: outpatient  NARRATIVE: The patient was brought to the treatment unit and placed in the planned treatment position. The clinical setup was verified. Then port films were obtained and uploaded to the radiation oncology medical record software.  The treatment beams were carefully compared against the planned radiation fields. The position location and shape of the radiation fields was reviewed. They targeted volume of tissue appears to be appropriately covered by the radiation beams. Organs at risk appear to be excluded as planned.  Based on my personal review, I approved the simulation verification. The patient's treatment will proceed as planned.  -----------------------------------  Blair Promise, PhD, MD  This document serves as a record of services personally performed by Gery Pray, MD. It was created on his behalf by Mary-Margaret Loma Messing, a trained medical scribe. The creation of this record is based on the scribe's personal observations and the provider's statements to them. This document has been checked and approved by the attending provider.

## 2019-02-15 ENCOUNTER — Other Ambulatory Visit: Payer: Self-pay

## 2019-02-15 ENCOUNTER — Ambulatory Visit
Admission: RE | Admit: 2019-02-15 | Discharge: 2019-02-15 | Disposition: A | Discharge status: Home / Self Care | Payer: 59 | Source: Ambulatory Visit | Attending: Radiation Oncology | Admitting: Radiation Oncology

## 2019-02-15 DIAGNOSIS — C50411 Malignant neoplasm of upper-outer quadrant of right female breast: Secondary | ICD-10-CM

## 2019-02-15 DIAGNOSIS — Z171 Estrogen receptor negative status [ER-]: Secondary | ICD-10-CM

## 2019-02-15 MED ORDER — ALRA NON-METALLIC DEODORANT (RAD-ONC)
1.0000 "application " | Freq: Once | TOPICAL | Status: AC
  Administered 2019-02-15: 1 via TOPICAL

## 2019-02-15 MED ORDER — RADIAPLEXRX EX GEL
Freq: Once | CUTANEOUS | Status: AC
  Administered 2019-02-15: 17:00:00 via TOPICAL

## 2019-02-16 ENCOUNTER — Other Ambulatory Visit: Payer: Self-pay

## 2019-02-16 ENCOUNTER — Ambulatory Visit
Admission: RE | Admit: 2019-02-16 | Discharge: 2019-02-16 | Disposition: A | Discharge status: Home / Self Care | Payer: 59 | Source: Ambulatory Visit | Attending: Radiation Oncology | Admitting: Radiation Oncology

## 2019-02-17 ENCOUNTER — Ambulatory Visit
Admission: RE | Admit: 2019-02-17 | Discharge: 2019-02-17 | Disposition: A | Discharge status: Home / Self Care | Payer: 59 | Source: Ambulatory Visit | Attending: Radiation Oncology | Admitting: Radiation Oncology

## 2019-02-17 ENCOUNTER — Other Ambulatory Visit: Payer: Self-pay

## 2019-02-18 ENCOUNTER — Other Ambulatory Visit: Payer: Self-pay

## 2019-02-18 ENCOUNTER — Ambulatory Visit
Admission: RE | Admit: 2019-02-18 | Discharge: 2019-02-18 | Disposition: A | Discharge status: Home / Self Care | Payer: 59 | Source: Ambulatory Visit | Attending: Radiation Oncology | Admitting: Radiation Oncology

## 2019-02-21 ENCOUNTER — Other Ambulatory Visit: Payer: Self-pay

## 2019-02-21 ENCOUNTER — Ambulatory Visit
Admission: RE | Admit: 2019-02-21 | Discharge: 2019-02-21 | Disposition: A | Discharge status: Home / Self Care | Payer: 59 | Source: Ambulatory Visit | Attending: Radiation Oncology | Admitting: Radiation Oncology

## 2019-02-22 ENCOUNTER — Other Ambulatory Visit: Payer: Self-pay

## 2019-02-22 ENCOUNTER — Ambulatory Visit
Admission: RE | Admit: 2019-02-22 | Discharge: 2019-02-22 | Disposition: A | Discharge status: Home / Self Care | Payer: 59 | Source: Ambulatory Visit | Attending: Radiation Oncology | Admitting: Radiation Oncology

## 2019-02-23 ENCOUNTER — Ambulatory Visit
Admission: RE | Admit: 2019-02-23 | Discharge: 2019-02-23 | Disposition: A | Discharge status: Home / Self Care | Payer: 59 | Source: Ambulatory Visit | Attending: Radiation Oncology | Admitting: Radiation Oncology

## 2019-02-23 ENCOUNTER — Other Ambulatory Visit: Payer: Self-pay

## 2019-02-24 ENCOUNTER — Ambulatory Visit
Admission: RE | Admit: 2019-02-24 | Discharge: 2019-02-24 | Disposition: A | Discharge status: Home / Self Care | Payer: 59 | Source: Ambulatory Visit | Attending: Radiation Oncology | Admitting: Radiation Oncology

## 2019-02-25 ENCOUNTER — Ambulatory Visit
Admission: RE | Admit: 2019-02-25 | Discharge: 2019-02-25 | Disposition: A | Discharge status: Home / Self Care | Payer: 59 | Source: Ambulatory Visit | Attending: Radiation Oncology | Admitting: Radiation Oncology

## 2019-02-25 ENCOUNTER — Other Ambulatory Visit: Payer: Self-pay

## 2019-02-28 ENCOUNTER — Other Ambulatory Visit: Payer: Self-pay

## 2019-02-28 ENCOUNTER — Ambulatory Visit
Admission: RE | Admit: 2019-02-28 | Discharge: 2019-02-28 | Disposition: A | Discharge status: Home / Self Care | Payer: 59 | Source: Ambulatory Visit | Attending: Radiation Oncology | Admitting: Radiation Oncology

## 2019-03-01 ENCOUNTER — Ambulatory Visit
Admission: RE | Admit: 2019-03-01 | Discharge: 2019-03-01 | Disposition: A | Discharge status: Home / Self Care | Payer: 59 | Source: Ambulatory Visit | Attending: Radiation Oncology | Admitting: Radiation Oncology

## 2019-03-01 ENCOUNTER — Other Ambulatory Visit: Payer: Self-pay

## 2019-03-02 ENCOUNTER — Other Ambulatory Visit: Payer: Self-pay

## 2019-03-02 ENCOUNTER — Ambulatory Visit
Admission: RE | Admit: 2019-03-02 | Discharge: 2019-03-02 | Disposition: A | Discharge status: Home / Self Care | Payer: 59 | Source: Ambulatory Visit | Attending: Radiation Oncology | Admitting: Radiation Oncology

## 2019-03-02 DIAGNOSIS — C50411 Malignant neoplasm of upper-outer quadrant of right female breast: Secondary | ICD-10-CM | POA: Insufficient documentation

## 2019-03-02 DIAGNOSIS — Z171 Estrogen receptor negative status [ER-]: Secondary | ICD-10-CM | POA: Insufficient documentation

## 2019-03-02 MED FILL — GABAPENTIN 300 MG CAPSULE: 300 | 30 days supply | Qty: 60 | Fill #2

## 2019-03-03 ENCOUNTER — Other Ambulatory Visit: Payer: Self-pay

## 2019-03-03 ENCOUNTER — Ambulatory Visit
Admission: RE | Admit: 2019-03-03 | Discharge: 2019-03-03 | Disposition: A | Discharge status: Home / Self Care | Payer: 59 | Source: Ambulatory Visit | Attending: Radiation Oncology | Admitting: Radiation Oncology

## 2019-03-04 ENCOUNTER — Other Ambulatory Visit: Payer: Self-pay

## 2019-03-04 ENCOUNTER — Ambulatory Visit
Admission: RE | Admit: 2019-03-04 | Discharge: 2019-03-04 | Disposition: A | Discharge status: Home / Self Care | Payer: 59 | Source: Ambulatory Visit | Attending: Radiation Oncology | Admitting: Radiation Oncology

## 2019-03-07 ENCOUNTER — Ambulatory Visit: Payer: 59

## 2019-03-08 ENCOUNTER — Ambulatory Visit
Admission: RE | Admit: 2019-03-08 | Discharge: 2019-03-08 | Disposition: A | Discharge status: Home / Self Care | Payer: 59 | Source: Ambulatory Visit | Attending: Radiation Oncology | Admitting: Radiation Oncology

## 2019-03-08 ENCOUNTER — Other Ambulatory Visit: Payer: Self-pay

## 2019-03-09 ENCOUNTER — Ambulatory Visit
Admission: RE | Admit: 2019-03-09 | Discharge: 2019-03-09 | Disposition: A | Discharge status: Home / Self Care | Payer: 59 | Source: Ambulatory Visit | Attending: Radiation Oncology | Admitting: Radiation Oncology

## 2019-03-09 ENCOUNTER — Other Ambulatory Visit: Payer: Self-pay

## 2019-03-10 ENCOUNTER — Ambulatory Visit
Admission: RE | Admit: 2019-03-10 | Discharge: 2019-03-10 | Disposition: A | Discharge status: Home / Self Care | Payer: 59 | Source: Ambulatory Visit | Attending: Radiation Oncology | Admitting: Radiation Oncology

## 2019-03-10 ENCOUNTER — Other Ambulatory Visit: Payer: Self-pay

## 2019-03-11 ENCOUNTER — Other Ambulatory Visit: Payer: Self-pay

## 2019-03-11 ENCOUNTER — Ambulatory Visit
Admission: RE | Admit: 2019-03-11 | Discharge: 2019-03-11 | Disposition: A | Discharge status: Home / Self Care | Payer: 59 | Source: Ambulatory Visit | Attending: Radiation Oncology | Admitting: Radiation Oncology

## 2019-03-14 ENCOUNTER — Ambulatory Visit
Admission: RE | Admit: 2019-03-14 | Discharge: 2019-03-14 | Disposition: A | Discharge status: Home / Self Care | Payer: 59 | Source: Ambulatory Visit | Attending: Radiation Oncology | Admitting: Radiation Oncology

## 2019-03-14 ENCOUNTER — Other Ambulatory Visit: Payer: Self-pay

## 2019-03-15 ENCOUNTER — Other Ambulatory Visit: Payer: Self-pay

## 2019-03-15 ENCOUNTER — Ambulatory Visit
Admission: RE | Admit: 2019-03-15 | Discharge: 2019-03-15 | Disposition: A | Discharge status: Home / Self Care | Payer: 59 | Source: Ambulatory Visit | Attending: Radiation Oncology | Admitting: Radiation Oncology

## 2019-03-16 ENCOUNTER — Other Ambulatory Visit: Payer: Self-pay

## 2019-03-16 ENCOUNTER — Ambulatory Visit
Admission: RE | Admit: 2019-03-16 | Discharge: 2019-03-16 | Disposition: A | Discharge status: Home / Self Care | Payer: 59 | Source: Ambulatory Visit | Attending: Radiation Oncology | Admitting: Radiation Oncology

## 2019-03-17 ENCOUNTER — Ambulatory Visit
Admission: RE | Admit: 2019-03-17 | Discharge: 2019-03-17 | Disposition: A | Discharge status: Home / Self Care | Payer: 59 | Source: Ambulatory Visit | Attending: Radiation Oncology | Admitting: Radiation Oncology

## 2019-03-17 ENCOUNTER — Other Ambulatory Visit: Payer: Self-pay

## 2019-03-18 ENCOUNTER — Ambulatory Visit
Admission: RE | Admit: 2019-03-18 | Discharge: 2019-03-18 | Disposition: A | Discharge status: Home / Self Care | Payer: 59 | Source: Ambulatory Visit | Attending: Radiation Oncology | Admitting: Radiation Oncology

## 2019-03-18 ENCOUNTER — Other Ambulatory Visit: Payer: Self-pay

## 2019-03-21 ENCOUNTER — Ambulatory Visit
Admission: RE | Admit: 2019-03-21 | Discharge: 2019-03-21 | Disposition: A | Discharge status: Home / Self Care | Payer: 59 | Source: Ambulatory Visit | Attending: Radiation Oncology | Admitting: Radiation Oncology

## 2019-03-21 ENCOUNTER — Other Ambulatory Visit: Payer: Self-pay

## 2019-03-21 NOTE — Assessment & Plan Note (Signed)
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

## 2019-03-22 ENCOUNTER — Other Ambulatory Visit: Payer: Self-pay | Admitting: Radiation Oncology

## 2019-03-22 ENCOUNTER — Other Ambulatory Visit: Payer: Self-pay

## 2019-03-22 ENCOUNTER — Ambulatory Visit
Admission: RE | Admit: 2019-03-22 | Discharge: 2019-03-22 | Disposition: A | Discharge status: Home / Self Care | Payer: 59 | Source: Ambulatory Visit | Attending: Radiation Oncology | Admitting: Radiation Oncology

## 2019-03-22 MED ORDER — OXYCODONE HCL 5 MG PO TABS: 5.0000 mg | ORAL_TABLET | ORAL | 0 refills | Status: DC | PRN

## 2019-03-22 MED FILL — oxyCODONE HCL 5 MG TABS: 5 | 5 days supply | Qty: 30 | Fill #0

## 2019-03-23 ENCOUNTER — Ambulatory Visit
Admission: RE | Admit: 2019-03-23 | Discharge: 2019-03-23 | Disposition: A | Discharge status: Home / Self Care | Payer: 59 | Source: Ambulatory Visit | Attending: Radiation Oncology | Admitting: Radiation Oncology

## 2019-03-24 ENCOUNTER — Ambulatory Visit: Payer: 59

## 2019-03-24 ENCOUNTER — Ambulatory Visit
Admission: RE | Admit: 2019-03-24 | Discharge: 2019-03-24 | Disposition: A | Discharge status: Home / Self Care | Payer: 59 | Source: Ambulatory Visit | Attending: Radiation Oncology | Admitting: Radiation Oncology

## 2019-03-24 ENCOUNTER — Telehealth: Payer: Self-pay | Admitting: Hematology and Oncology

## 2019-03-24 ENCOUNTER — Other Ambulatory Visit: Payer: Self-pay

## 2019-03-24 NOTE — Telephone Encounter (Signed)
Called patient regarding upcoming Web[facility] appointment, left voicemail for patient and changed this to a telephone visit due to no e-mail provided.

## 2019-03-25 ENCOUNTER — Ambulatory Visit
Admission: RE | Admit: 2019-03-25 | Discharge: 2019-03-25 | Disposition: A | Discharge status: Home / Self Care | Payer: 59 | Source: Ambulatory Visit | Attending: Radiation Oncology | Admitting: Radiation Oncology

## 2019-03-25 ENCOUNTER — Ambulatory Visit: Payer: 59

## 2019-03-25 ENCOUNTER — Encounter: Payer: Self-pay | Admitting: General Practice

## 2019-03-25 ENCOUNTER — Other Ambulatory Visit: Payer: Self-pay

## 2019-03-25 NOTE — Progress Notes (Signed)
[facility] Team contacted patient to assess for food insecurity and other psychosocial needs during current COVID19 pandemic. Left VM w my contact information and encouragement to call for support/needs.      Beverely Pace, [place]

## 2019-03-28 ENCOUNTER — Ambulatory Visit
Admission: RE | Admit: 2019-03-28 | Discharge: 2019-03-28 | Disposition: A | Discharge status: Home / Self Care | Payer: Self-pay | Source: Ambulatory Visit | Attending: Radiation Oncology | Admitting: Radiation Oncology

## 2019-03-28 ENCOUNTER — Other Ambulatory Visit: Payer: Self-pay
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
## 2019-03-29 ENCOUNTER — Inpatient Hospital Stay: Payer: Self-pay | Attending: Genetic Counselor | Admitting: Hematology and Oncology

## 2019-03-29 ENCOUNTER — Encounter: Payer: Self-pay | Admitting: *Deleted

## 2019-03-29 ENCOUNTER — Ambulatory Visit
Admission: RE | Admit: 2019-03-29 | Discharge: 2019-03-29 | Disposition: A | Discharge status: Home / Self Care | Payer: 59 | Source: Ambulatory Visit | Attending: Radiation Oncology | Admitting: Radiation Oncology

## 2019-03-29 DIAGNOSIS — Z9221 Personal history of antineoplastic chemotherapy: Secondary | ICD-10-CM

## 2019-03-29 DIAGNOSIS — Z923 Personal history of irradiation: Secondary | ICD-10-CM

## 2019-03-29 DIAGNOSIS — C50411 Malignant neoplasm of upper-outer quadrant of right female breast: Secondary | ICD-10-CM

## 2019-03-29 DIAGNOSIS — Z171 Estrogen receptor negative status [ER-]: Secondary | ICD-10-CM

## 2019-03-29 NOTE — Progress Notes (Signed)
Msg sent to Dr. Rikki Spearing scheduler requesting port removal per Dr. Lindi Adie request.

## 2019-03-30 ENCOUNTER — Ambulatory Visit
Admission: RE | Admit: 2019-03-30 | Discharge: 2019-03-30 | Disposition: A | Discharge status: Home / Self Care | Payer: 59 | Source: Ambulatory Visit | Attending: Radiation Oncology | Admitting: Radiation Oncology

## 2019-03-30 ENCOUNTER — Ambulatory Visit: Payer: 59

## 2019-03-30 ENCOUNTER — Other Ambulatory Visit: Payer: Self-pay

## 2019-03-31 ENCOUNTER — Other Ambulatory Visit: Payer: Self-pay

## 2019-03-31 ENCOUNTER — Encounter: Payer: Self-pay | Admitting: Radiation Oncology

## 2019-03-31 ENCOUNTER — Encounter: Payer: Self-pay | Admitting: *Deleted

## 2019-03-31 ENCOUNTER — Ambulatory Visit
Admission: RE | Admit: 2019-03-31 | Discharge: 2019-03-31 | Disposition: A | Discharge status: Home / Self Care | Payer: 59 | Source: Ambulatory Visit | Attending: Radiation Oncology | Admitting: Radiation Oncology

## 2019-03-31 MED FILL — GABAPENTIN 300 MG CAPSULE: 300 | 30 days supply | Qty: 60 | Fill #3

## 2019-04-07 NOTE — Progress Notes (Signed)
  Radiation Oncology         [phone] ________________________________  Name: Tara Rodriguez MRN: [id number]  Date: 03/31/2019  DOB: January 12, 1971  End of Treatment Note  Diagnosis:   48 y.o. female with StageIIB(ypT0 ypN0 cM0)RightBreast UOQ Invasive Ductal Carcinoma, ER-/ PR-/ Her2-, Grade3    Indication for treatment:  Curative       Radiation treatment dates:   02/14/2019 - 03/31/2019  Site/dose:    1. Right Breast / 50.4 Gy in 28 fractions of 1.8 Gy 2. Right Breast Boost / 10 Gy in 5 fractions of 2 Gy  Beams/energy:    1. 3D / 6X, 10X Photon 2. 10X, 6X Photon boost  Narrative: The patient tolerated radiation treatment relatively well.   She experienced mild fatigue and some expected skin changes as she progressed through treatment. Her breast is swollen and hyperpigmented with desquamation. She is applying Neosporin to the open areas and Radiaplex gel everywhere else. She also reported significant breast pain that would interrupt her sleep. She noted improvement of pain with Tylenol alternating with Advil or Aleve.  Plan: The patient has completed radiation treatment. The patient will return to radiation oncology clinic for routine followup in one month. I advised them to call or return sooner if they have any questions or concerns related to their recovery or treatment.  -----------------------------------  Blair Promise, PhD, MD  This document serves as a record of services personally performed by Gery Pray, MD. It was created on his behalf by Rae Lips, a trained medical scribe. The creation of this record is based on the scribe's personal observations and the provider's statements to them. This document has been checked and approved by the attending provider.

## 2019-04-28 ENCOUNTER — Other Ambulatory Visit: Payer: Self-pay | Admitting: *Deleted

## 2019-04-28 MED ORDER — GABAPENTIN 300 MG PO CAPS: 300.0000 mg | ORAL_CAPSULE | Freq: Three times a day (TID) | ORAL | 2 refills | Status: DC

## 2019-04-28 MED FILL — GABAPENTIN 300 MG CAPSULE: 300 | 30 days supply | Qty: 90 | Fill #0

## 2019-05-02 ENCOUNTER — Encounter: Payer: Self-pay | Admitting: Radiation Oncology

## 2019-05-02 ENCOUNTER — Other Ambulatory Visit: Payer: Self-pay

## 2019-05-02 ENCOUNTER — Ambulatory Visit
Admission: RE | Admit: 2019-05-02 | Discharge: 2019-05-02 | Disposition: A | Discharge status: Home / Self Care | Payer: No Typology Code available for payment source | Source: Ambulatory Visit | Attending: Radiation Oncology | Admitting: Radiation Oncology

## 2019-05-02 VITALS — BP 141/93 | HR 78 | Temp 98.5°F | Resp 18 | Ht 69.0 in | Wt 210.4 lb

## 2019-05-02 DIAGNOSIS — Z171 Estrogen receptor negative status [ER-]: Secondary | ICD-10-CM

## 2019-05-02 DIAGNOSIS — C50411 Malignant neoplasm of upper-outer quadrant of right female breast: Secondary | ICD-10-CM

## 2019-05-02 NOTE — Progress Notes (Signed)
Radiation Oncology         [phone] ________________________________  Name: Tara Rodriguez MRN: [id number]  Date: 05/02/2019  DOB: [birth year]  Follow-Up Visit Note  CC: Tysinger, Camelia Eng, Gennette Pac, MD    ICD-10-CM   1. Malignant neoplasm of upper-outer quadrant of right breast in female, estrogen receptor negative ([place]) C50.411    Z17.1     Diagnosis:   StageIIB(ypT0 ypN0 cM0)RightBreast UOQ Invasive Ductal Carcinoma, ER-/ PR-/ Her2-, Grade3    Interval Since Last Radiation:  1 months  Radiation treatment dates:   02/14/2019 - 03/31/2019  Site/dose:    1. Right Breast / 50.4 Gy in 28 fractions of 1.8 Gy 2. Right Breast Boost / 10 Gy in 5 fractions of 2 Gy  Narrative:  The patient returns today for routine follow-up.  she is doing well overall.  She continues to have some fatigue but denies any discomfort or itching within the right breast.  She denies any problems with swelling in her right arm or hand.  She is wondering about whether her Port-A-Cath will be removed and we have in the reached out to medical oncology concerning this issue. She Is also interested in returning to work and most recent blood work shows no issues with low white count or other issues.                             ALLERGIES:  has No Known Allergies.  Meds: Current Outpatient Medications  Medication Sig Dispense Refill  . acetaminophen (TYLENOL) 500 MG tablet Take 500 mg by mouth every 6 (six) hours as needed for mild pain.     Marland Kitchen gabapentin (NEURONTIN) 300 MG capsule Take 1 capsule (300 mg total) by mouth 3 (three) times daily. 90 capsule 2  . ibuprofen (ADVIL,MOTRIN) 800 MG tablet Take 1 tablet (800 mg total) by mouth every 8 (eight) hours as needed for mild pain. 30 tablet 0  . naproxen sodium (ALEVE) 220 MG tablet Take 220 mg by mouth as needed (pain).      No current facility-administered medications for this encounter.     Physical Findings: The patient is in no acute  distress. Patient is alert and oriented.  height is 5' 9" (1.753 m) and weight is 210 lb 6 oz (95.4 kg). Her temporal temperature is 98.5 F (36.9 C). Her blood pressure is 141/93 (abnormal) and her pulse is 78. Her respiration is 18 and oxygen saturation is 100%. .  Lungs are clear to auscultation bilaterally. Heart has regular rate and rhythm. No palpable cervical, supraclavicular, or axillary adenopathy. Abdomen soft, non-tender, normal bowel sounds.  Examination of the right breast reveals some mild hyperpigmentation changes.  The skin has healed well.  No dominant mass appreciated breast nipple discharge or bleeding.  Lab Findings: Lab Results  Component Value Date   WBC 5.8 02/06/2019   HGB 11.9 (L) 02/06/2019   HCT 37.0 02/06/2019   MCV 98.1 02/06/2019   PLT 237 02/06/2019    Radiographic Findings: No results found.  Impression:  The patient is recovering from the effects of radiation.  No evidence of recurrence on clinical exam today.  Patient continues to have some fatigue but feels she is close to resuming work.  She works as a [employer]ar[employer] manager.  Plan: As far as radiation recovery the patient could return to work at this time.  She will check with medical oncology to see if they  are in agreement.  PRN follow-up in radiation oncology.  Survivorship appointment in early August.  ____________________________________   Blair Promise, PhD, MD    This document serves as a record of services personally performed by Gery Pray, MD. It was created on his behalf by Mary-Margaret Loma Messing, a trained medical scribe. The creation of this record is based on the scribe's personal observations and the provider's statements to them. This document has been checked and approved by the attending provider.

## 2019-05-02 NOTE — Progress Notes (Signed)
Patient denies pain today. Skin is dry and intact. Pt states having mild fatigue. Pt has no complaints or concerns today.   BP (!) 141/93 (BP Location: Left Arm, Patient Position: Sitting)   Pulse 78   Temp 98.5 F (36.9 C) (Temporal)   Resp 18   Ht 5\' 9"  (1.753 m)   Wt 210 lb 6 oz (95.4 kg)   SpO2 100%   BMI 31.07 kg/m

## 2019-05-03 ENCOUNTER — Telehealth: Payer: Self-pay

## 2019-05-03 NOTE — Telephone Encounter (Signed)
RN placed call to patient to follow up regarding questions about port flushes/removal.    Voicemail left for return call.

## 2019-05-05 ENCOUNTER — Telehealth: Payer: Self-pay

## 2019-05-05 NOTE — Telephone Encounter (Signed)
RN spoke with patient regarding request for port removal.  OK per Dr. Lindi Adie for removal of port.  Pt notified message would be sent to surgeon and they will contact her with additional appointments.  Voiced understanding, no further needs.

## 2019-05-12 ENCOUNTER — Encounter: Payer: Self-pay | Admitting: General Practice

## 2019-05-12 NOTE — Progress Notes (Signed)
[facility] CSW Progress Notes  Call from patient, "is there any help for me with my hospital bills?"  Concerned about large bills she is receiving.  States she was denied for both Medicaid and disability.  No longer in active treatment, so [facility] is not an option for assistance as this is one of their criteria.  Sent link to [facility]'s financial assistance information via email, encouraged her to review and contact billing department to discuss her options.  Edwyna Shell, LCSW Clinical Social Worker Phone:  [phone]

## 2019-06-13 ENCOUNTER — Telehealth: Payer: Self-pay | Admitting: Adult Health

## 2019-06-13 NOTE — Telephone Encounter (Signed)
I left a message regarding video visit  °

## 2019-06-20 MED FILL — GABAPENTIN 300 MG CAPSULE: 300 | 30 days supply | Qty: 90 | Fill #1

## 2019-06-22 ENCOUNTER — Other Ambulatory Visit: Payer: Self-pay | Admitting: *Deleted

## 2019-06-22 DIAGNOSIS — Z95828 Presence of other vascular implants and grafts: Secondary | ICD-10-CM

## 2019-06-22 NOTE — Progress Notes (Signed)
Per Dr. Lindi Adie, order placed for [facility] to remove pt port.  Apt scheduled for Monday 06/27/2019 arriving at 1 PM, NPO that morning.  Pt notified and verbalized understanding of apt.

## 2019-06-24 ENCOUNTER — Other Ambulatory Visit: Payer: Self-pay | Admitting: Radiology

## 2019-06-27 ENCOUNTER — Encounter (HOSPITAL_COMMUNITY): Payer: Self-pay

## 2019-06-27 ENCOUNTER — Ambulatory Visit (HOSPITAL_COMMUNITY)
Admission: RE | Admit: 2019-06-27 | Discharge: 2019-06-27 | Disposition: A | Discharge status: Home / Self Care | Payer: Self-pay | Source: Ambulatory Visit | Attending: Hematology and Oncology | Admitting: Hematology and Oncology

## 2019-06-27 ENCOUNTER — Other Ambulatory Visit: Payer: Self-pay

## 2019-06-27 DIAGNOSIS — Z95828 Presence of other vascular implants and grafts: Secondary | ICD-10-CM

## 2019-06-27 DIAGNOSIS — Z853 Personal history of malignant neoplasm of breast: Secondary | ICD-10-CM | POA: Insufficient documentation

## 2019-06-27 DIAGNOSIS — Z9221 Personal history of antineoplastic chemotherapy: Secondary | ICD-10-CM | POA: Insufficient documentation

## 2019-06-27 DIAGNOSIS — Z923 Personal history of irradiation: Secondary | ICD-10-CM | POA: Insufficient documentation

## 2019-06-27 DIAGNOSIS — Z452 Encounter for adjustment and management of vascular access device: Secondary | ICD-10-CM | POA: Insufficient documentation

## 2019-06-27 HISTORY — PX: IR REMOVAL TUN ACCESS W/ PORT W/O FL MOD SED: IMG2290

## 2019-06-27 LAB — BASIC METABOLIC PANEL
Anion gap: 10 (ref 5–15)
BUN: 13 mg/dL (ref 6–20)
CO2: 23 mmol/L (ref 22–32)
Calcium: 9.2 mg/dL (ref 8.9–10.3)
Chloride: 109 mmol/L (ref 98–111)
Creatinine, Ser: 0.83 mg/dL (ref 0.44–1.00)
GFR calc Af Amer: >60 mL/min (ref >60)
GFR calc non Af Amer: >60 mL/min (ref >60)
Glucose, Bld: 97 mg/dL (ref 70–99)
Potassium: 3.7 mmol/L (ref 3.5–5.1)
Sodium: 142 mmol/L (ref 135–145)

## 2019-06-27 LAB — CBC WITH DIFFERENTIAL/PLATELET
Abs Immature Granulocytes: 0.03 10*3/uL (ref 0.00–0.07)
Basophils Absolute: 0 10*3/uL (ref 0.0–0.1)
Basophils Relative: 1 %
Eosinophils Absolute: 0.1 10*3/uL (ref 0.0–0.5)
Eosinophils Relative: 1 %
HCT: 41.7 % (ref 36.0–46.0)
Hemoglobin: 13.1 g/dL (ref 12.0–15.0)
Immature Granulocytes: 1 %
Lymphocytes Relative: 46 %
Lymphs Abs: 2.5 10*3/uL (ref 0.7–4.0)
MCH: 29.6 pg (ref 26.0–34.0)
MCHC: 31.4 g/dL (ref 30.0–36.0)
MCV: 94.3 fL (ref 80.0–100.0)
Monocytes Absolute: 0.6 10*3/uL (ref 0.1–1.0)
Monocytes Relative: 10 %
Neutro Abs: 2.2 10*3/uL (ref 1.7–7.7)
Neutrophils Relative %: 41 %
Platelets: 245 10*3/uL (ref 150–400)
RBC: 4.42 MIL/uL (ref 3.87–5.11)
RDW: 14.1 % (ref 11.5–15.5)
WBC: 5.4 10*3/uL (ref 4.0–10.5)
nRBC: 0 % (ref 0.0–0.2)

## 2019-06-27 LAB — PROTIME-INR
INR: 1 (ref 0.8–1.2)
Prothrombin Time: 12.8 seconds (ref 11.4–15.2)

## 2019-06-27 MED ORDER — CEFAZOLIN SODIUM-DEXTROSE 2-4 GM/100ML-% IV SOLN
INTRAVENOUS | Status: AC
  Administered 2019-06-27: 2 g via INTRAVENOUS
  Filled 2019-06-27: qty 100

## 2019-06-27 MED ORDER — LIDOCAINE-EPINEPHRINE (PF) 1.5 %-1:200000 IJ SOLN
INTRAMUSCULAR | Status: DC | PRN
  Administered 2019-06-27: 10 mL via INTRADERMAL

## 2019-06-27 MED ORDER — LIDOCAINE-EPINEPHRINE (PF) 2 %-1:200000 IJ SOLN
INTRAMUSCULAR | Status: AC
  Administered 2019-06-27: 10 mL via INTRADERMAL
  Filled 2019-06-27: qty 20

## 2019-06-27 MED ORDER — SODIUM CHLORIDE 0.9 % IV SOLN
INTRAVENOUS | Status: DC
  Administered 2019-06-27: 13:00:00 via INTRAVENOUS

## 2019-06-27 MED ORDER — CEFAZOLIN SODIUM-DEXTROSE 2-4 GM/100ML-% IV SOLN
2.0000 g | INTRAVENOUS | Status: AC
  Administered 2019-06-27: 15:00:00 2 g via INTRAVENOUS

## 2019-06-27 NOTE — Progress Notes (Addendum)
Patient ID: Tara Rodriguez, female   DOB: 12-04-1970, 48 y.o.   MRN: [id number] Patient presents to IR department today for Port-A-Cath removal.  She has a history of right breast cancer diagnosed in 2019, status post lumpectomy with chemoradiation.  She has completed treatment .  Port was initially placed by [facility] on 07/05/2018.  Details/risks of Port-A-Cath removal, including but not limited to, internal bleeding, infection, injury to adjacent structures discussed with patient with her understanding and consent.  She does not wish to receive IV conscious sedation for the procedure. LABS PENDING.

## 2019-06-27 NOTE — Discharge Instructions (Signed)
Incision Care, Adult °An incision is a surgical cut that is made through your skin. Most incisions are closed after surgery. Your incision may be closed with stitches (sutures), staples, skin glue, or adhesive strips. You may need to return to your health care provider to have sutures or staples removed. This may occur several days to several weeks after your surgery. The incision needs to be cared for properly to prevent infection. °How to care for your incision °Incision care ° °· Follow instructions from your health care provider about how to take care of your incision. Make sure you: °? Wash your hands with soap and water before you change the bandage (dressing). If soap and water are not available, use hand sanitizer. °? Change your dressing as told by your health care provider. °? Leave sutures, skin glue, or adhesive strips in place. These skin closures may need to stay in place for 2 weeks or longer. If adhesive strip edges start to loosen and curl up, you may trim the loose edges. Do not remove adhesive strips completely unless your health care provider tells you to do that. °· Check your incision area every day for signs of infection. Check for: °? More redness, swelling, or pain. °? More fluid or blood. °? Warmth. °? Pus or a bad smell. °· Ask your health care provider how to clean the incision. This may include: °? Using mild soap and water. °? Using a clean towel to pat the incision dry after cleaning it. °? Applying a cream or ointment. Do this only as told by your health care provider. °? Covering the incision with a clean dressing. °· Ask your health care provider when you can leave the incision uncovered. °· Do not take baths, swim, or use a hot tub until your health care provider approves. Ask your health care provider if you can take showers. You may only be allowed to take sponge baths for bathing. °Medicines °· If you were prescribed an antibiotic medicine, cream, or ointment, take or apply the  antibiotic as told by your health care provider. Do not stop taking or applying the antibiotic even if your condition improves. °· Take over-the-counter and prescription medicines only as told by your health care provider. °General instructions °· Limit movement around your incision to improve healing. °? Avoid straining, lifting, or exercise for the first month, or for as long as told by your health care provider. °? Follow instructions from your health care provider about returning to your normal activities. °? Ask your health care provider what activities are safe. °· Protect your incision from the sun when you are outside for the first 6 months, or for as long as told by your health care provider. Apply sunscreen around the scar or cover it up. °· Keep all follow-up visits as told by your health care provider. This is important. °Contact a health care provider if: °· Your have more redness, swelling, or pain around the incision. °· You have more fluid or blood coming from the incision. °· Your incision feels warm to the touch. °· You have pus or a bad smell coming from the incision. °· You have a fever or shaking chills. °· You are nauseous or you vomit. °· You are dizzy. °· Your sutures or staples come undone. °Get help right away if: °· You have a red streak coming from your incision. °· Your incision bleeds through the dressing and the bleeding does not stop with gentle pressure. °· The edges of   your incision open up and separate. °· You have severe pain. °· You have a rash. °· You are confused. °· You faint. °· You have trouble breathing and a fast heartbeat. °This information is not intended to replace advice given to you by your health care provider. Make sure you discuss any questions you have with your health care provider. °Document Released: 06/06/2005 Document Revised: 11/19/2018 Document Reviewed: 06/04/2016 °Elsevier Patient Education © 2020 [employer] ° °

## 2019-06-27 NOTE — Procedures (Signed)
Pre Procedural Dx: Poor venous access Post Procedural Dx: Same  Successful removal of anterior chest wall port-a-cath.  EBL: Trace No immediate post procedural complications.   Jay Brailee Riede, MD Pager #: [phone]  

## 2019-06-29 ENCOUNTER — Telehealth: Payer: Self-pay

## 2019-06-29 NOTE — Telephone Encounter (Signed)
RN spoke with patient, patient requesting a note to return back to work on 06/30/2019.  Pt inquiring what precautions she should take related to COVID 19 and having a history of breast cancer.   RN educated based on CDC guidelines, all questions were answered.  Pt will pick up note.

## 2019-07-07 ENCOUNTER — Inpatient Hospital Stay: Payer: No Typology Code available for payment source | Attending: Adult Health | Admitting: Adult Health

## 2019-07-27 ENCOUNTER — Telehealth: Payer: Self-pay | Admitting: *Deleted

## 2019-07-27 NOTE — Telephone Encounter (Signed)
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  Pt states she experiences a sharp, shooting pain in her feet and at times she is unable to feel her feet.  Pt states on days that she works, she does not take her AM dose of gabapentin due to being drowsy.  Per Karie Georges, pt to start taking 600 mg of gabapentin at night before bed in hopes to control her neuropathy pain during the day.  Pt verbalized understanding and will call the office with any updates.

## 2019-08-12 ENCOUNTER — Telehealth: Payer: Self-pay

## 2019-08-12 ENCOUNTER — Other Ambulatory Visit: Payer: Self-pay

## 2019-08-12 MED ORDER — GABAPENTIN 300 MG PO CAPS: 300.0000 mg | ORAL_CAPSULE | Freq: Three times a day (TID) | ORAL | 2 refills | Status: DC

## 2019-08-12 NOTE — Telephone Encounter (Signed)
RN spoke with patient, pt requesting refill on Gabapentin.    Pt reports medication is not improving symptoms.  Pt unable to take medication the night before she works due to drowsiness.  Continues with numbness in bilateral hands and feet.    RN refilled Gabapentin, will review with MD if he recommends changes in medication.

## 2019-08-15 ENCOUNTER — Encounter: Payer: Self-pay | Admitting: *Deleted

## 2019-08-15 ENCOUNTER — Other Ambulatory Visit: Payer: Self-pay | Admitting: *Deleted

## 2019-08-15 MED ORDER — PREGABALIN 50 MG PO CAPS: 50.0000 mg | ORAL_CAPSULE | Freq: Three times a day (TID) | ORAL | 1 refills | Status: DC

## 2019-08-15 MED FILL — PREGABALIN 50 MG CAPS: 50 | 30 days supply | Qty: 90 | Fill #0

## 2019-08-15 NOTE — Progress Notes (Signed)
Per Dr. Lindi Adie, pt to stop taking gabapentin and to start taking Lyrica 50 mg p.o TID and can increase to 100 mg TID p.o in one week if pt still experiencing neuropathy.  Script sent to pharmacy and pt notified.

## 2019-08-19 ENCOUNTER — Telehealth: Payer: Self-pay

## 2019-08-19 MED FILL — GABAPENTIN 300 MG CAPSULE: 300 | 30 days supply | Qty: 90 | Fill #0

## 2019-08-19 NOTE — Telephone Encounter (Signed)
RN returned call, voicemail left for return call.  

## 2019-08-19 NOTE — Telephone Encounter (Signed)
Pt unable to tolerate Lyrica and would like to switch back to Gabapentin.   Pt reports she feels like the Lyrica is too strong.    Per MD okay to discontinue Lyrica, and resume Gabapentin.  [facility] notified.

## 2019-08-25 ENCOUNTER — Telehealth: Payer: Self-pay | Admitting: *Deleted

## 2019-08-25 NOTE — Telephone Encounter (Signed)
Received VM from pt, attempt x1 to return call, no answer and unable to LVM due to mailbox being full.

## 2019-09-16 ENCOUNTER — Other Ambulatory Visit: Payer: Self-pay

## 2019-09-16 ENCOUNTER — Encounter: Payer: Self-pay | Admitting: Podiatry

## 2019-09-16 ENCOUNTER — Telehealth: Payer: Self-pay

## 2019-09-16 ENCOUNTER — Ambulatory Visit (INDEPENDENT_AMBULATORY_CARE_PROVIDER_SITE_OTHER): Payer: 59

## 2019-09-16 ENCOUNTER — Ambulatory Visit (INDEPENDENT_AMBULATORY_CARE_PROVIDER_SITE_OTHER): Payer: 59 | Admitting: Podiatry

## 2019-09-16 VITALS — BP 149/89

## 2019-09-16 DIAGNOSIS — G5752 Tarsal tunnel syndrome, left lower limb: Secondary | ICD-10-CM

## 2019-09-16 DIAGNOSIS — M792 Neuralgia and neuritis, unspecified: Secondary | ICD-10-CM | POA: Diagnosis not present

## 2019-09-16 DIAGNOSIS — M79672 Pain in left foot: Secondary | ICD-10-CM

## 2019-09-16 DIAGNOSIS — G5751 Tarsal tunnel syndrome, right lower limb: Secondary | ICD-10-CM

## 2019-09-16 DIAGNOSIS — M79671 Pain in right foot: Secondary | ICD-10-CM

## 2019-09-16 DIAGNOSIS — M779 Enthesopathy, unspecified: Secondary | ICD-10-CM | POA: Diagnosis not present

## 2019-09-16 NOTE — Telephone Encounter (Signed)
Pt calling to request a work excuse for 10/17 due to severe neuropathy pain in bilateral feet. Pt reports correct use of Gabapentin, voiced reasoning that she has been working extra and is feeling the effects.    RN provided work excuse for 10/17.  Pt reports tingling/warm sensation to legs and arms on/off X 1 month.  Pt denies any changes in medications, lotions, or detergents.  RN encouraged use of oatmeal baths X 1 week, with use of anti itch creams.  Pt will report if no relief.

## 2019-09-19 ENCOUNTER — Other Ambulatory Visit: Payer: Self-pay | Admitting: Podiatry

## 2019-09-19 DIAGNOSIS — M779 Enthesopathy, unspecified: Secondary | ICD-10-CM

## 2019-09-20 ENCOUNTER — Encounter: Payer: Self-pay | Admitting: Podiatry

## 2019-09-20 ENCOUNTER — Telehealth: Payer: Self-pay | Admitting: *Deleted

## 2019-09-20 DIAGNOSIS — M792 Neuralgia and neuritis, unspecified: Secondary | ICD-10-CM

## 2019-09-20 DIAGNOSIS — G5752 Tarsal tunnel syndrome, left lower limb: Secondary | ICD-10-CM

## 2019-09-20 DIAGNOSIS — M79671 Pain in right foot: Secondary | ICD-10-CM

## 2019-09-20 DIAGNOSIS — G5751 Tarsal tunnel syndrome, right lower limb: Secondary | ICD-10-CM

## 2019-09-20 NOTE — Telephone Encounter (Signed)
Received call from pt stating she was seen by a podiatrist for bilateral foot pain who is referring her to a spine neurologist.  Pt states the podiatrist believes the pain is coming from her spinal area. Pt calling just to inform us on what is going on.

## 2019-09-20 NOTE — Telephone Encounter (Signed)
-----   Message from [name], DPM sent at 09/20/2019  7:44 AM EDT ----- [facility]  Would you be able to make appointment for this patient to see neurologist or spine neurology specialist.  I do not know of anyone specifically.

## 2019-09-20 NOTE — Progress Notes (Signed)
Subjective:  Patient ID: [facility], female    DOB: 14-Apr-1971,  MRN: [id number]  Chief Complaint  Patient presents with  . Foot Pain    pt is here for bil foot numbness, pt states that the pain has been going on for about a year, pt states that it could be due to chemo    48 y.o. female presents with the above complaint.  She states that she has numbness in bilateral lower extremity.  The pain also has stabbing and pins in both feet.  She states it is the whole put.  And has been going on for about a year.  She states that she had a chemotherapy which may have made the neuropathy worse.  States that she states that it is elevated or sitting for a while it still hurts.  Patient is taking gabapentin for it which has not helped.   Review of Systems: Negative except as noted in the HPI. Denies N/V/F/Ch.  Past Medical History:  Diagnosis Date  . Acrochordon    skin, left lower breast  . Allergy   . Anemia    history of  . Blood transfusion without reported diagnosis    during hysterectomy  . Family history of breast cancer   . Family history of colon cancer   . Migraine   . S/P hysterectomy    hx/o fibroids  . Wears glasses     Current Outpatient Medications:  .  acetaminophen (TYLENOL) 500 MG tablet, Take 500 mg by mouth every 6 (six) hours as needed for mild pain. , Disp: , Rfl:  .  gabapentin (NEURONTIN) 300 MG capsule, , Disp: , Rfl:  .  ibuprofen (ADVIL,MOTRIN) 800 MG tablet, Take 1 tablet (800 mg total) by mouth every 8 (eight) hours as needed for mild pain., Disp: 30 tablet, Rfl: 0 .  naproxen sodium (ALEVE) 220 MG tablet, Take 220 mg by mouth as needed (pain). , Disp: , Rfl:   Social History   Tobacco Use  Smoking Status Never Smoker  Smokeless Tobacco Never Used    No Known Allergies Objective:   Vitals:   09/16/19 1610  BP: (!) 149/89   There is no height or weight on file to calculate BMI. Constitutional Well developed. Well nourished.  Vascular  Dorsalis pedis pulses palpable bilaterally. Posterior tibial pulses palpable bilaterally. Capillary refill normal to all digits.  No cyanosis or clubbing noted. Pedal hair growth normal.  Neurologic Normal speech. Oriented to person, place, and time. Positive Tinel's sign at the tarsal tunnel region.  Positive Tinel's sign at common peroneal region.  The toenail side on the dorsal aspect of the foot.  These findings are both equal to bilateral feet.  There is decrease in protective sensation.  Symes monofilament testing positive.  Dermatologic Nails well groomed and normal in appearance. No open wounds. No skin lesions.  Orthopedic:  Ankle range of motion first MPJ range of motion all intact active and passive.  Pain on palpation.   Radiographs: Bilateral foot views AP and lateral of skeletally mature adult.  There is dorsal degenerative arthritic changes noted at the midfoot.  No other bony abnormalities noted to bilateral feet.  There is uneven decrease in ankle joint space/narrowing consistent with arthritic changes as well. Assessment:   1. Foot pain, bilateral   2. Tarsal tunnel syndrome of right side   3. Tarsal tunnel syndrome, left   4. Neuropathic pain    Plan:  Patient was evaluated and treated and  all questions answered.  Bilateral tarsal tunnel syndrome -Plan to the patient the etiology and all the treatment options available for bilateral tarsal tunnel syndrome.  I explained to the patient that nerve block will only temporarily help however it will help [place] diagnose that the etiology is definitely tarsal tunnel.  He understood and would like to proceed with the nerve block. -During examination, it seems that her neurologic symptoms are coming from further up the extremity likely from the spine.  She states that she is numb past the knee joint as well as in the thigh.  I explained to her that she may benefit from seeing a spine neurologist specialist.  Someone from our office  will contact her soon with the referral. -After obtaining consent, and per orders of Dr. Boneta Lucks, injection of 1% lidocaine 1 cc and half percent Marcaine plain 1 cc mixture to the tarsal tunnel bilaterally given by [name]. Patient instructed to remain in clinic for 20 minutes afterwards, and to report any adverse reaction to me immediately.   No follow-ups on file.

## 2019-09-22 ENCOUNTER — Telehealth: Payer: Self-pay | Admitting: *Deleted

## 2019-09-22 NOTE — Telephone Encounter (Signed)
Pt states she was to have a referral to neurology, but when she called they said they did not have one. Pt's referral to [facility] was faxed 09/20/2019.

## 2019-09-22 NOTE — Telephone Encounter (Signed)
Pt called again and states she called for an appt with [facility] and they said they needed a referral. I told pt the referral was to [facility] and [phone], and the referral had been faxed 09/20/2019 and confirmed received.

## 2019-10-14 ENCOUNTER — Telehealth: Payer: Self-pay | Admitting: *Deleted

## 2019-10-14 NOTE — Telephone Encounter (Signed)
Pt states she wants to discuss her feet.

## 2019-10-14 NOTE — Telephone Encounter (Signed)
Left message informing pt that Dr. Posey Pronto had wanted her to make an appt 4 weeks after her last appt of 09/16/2019 and she could call to schedule an discuss her treatment with Dr. Posey Pronto at that visit.

## 2019-10-16 ENCOUNTER — Emergency Department (HOSPITAL_COMMUNITY): Payer: 59

## 2019-10-16 ENCOUNTER — Emergency Department (HOSPITAL_COMMUNITY)
Admission: EM | Admit: 2019-10-16 | Discharge: 2019-10-16 | Disposition: A | Discharge status: Home / Self Care | Payer: 59 | Attending: Emergency Medicine | Admitting: Emergency Medicine

## 2019-10-16 ENCOUNTER — Encounter (HOSPITAL_COMMUNITY): Payer: Self-pay

## 2019-10-16 ENCOUNTER — Other Ambulatory Visit: Payer: Self-pay

## 2019-10-16 DIAGNOSIS — U071 COVID-19: Secondary | ICD-10-CM | POA: Insufficient documentation

## 2019-10-16 DIAGNOSIS — Z79899 Other long term (current) drug therapy: Secondary | ICD-10-CM | POA: Insufficient documentation

## 2019-10-16 DIAGNOSIS — Z20822 Contact with and (suspected) exposure to covid-19: Secondary | ICD-10-CM

## 2019-10-16 DIAGNOSIS — R05 Cough: Secondary | ICD-10-CM | POA: Diagnosis present

## 2019-10-16 DIAGNOSIS — B349 Viral infection, unspecified: Secondary | ICD-10-CM

## 2019-10-16 LAB — GROUP A STREP BY PCR: Group A Strep by PCR: NOT DETECTED

## 2019-10-16 LAB — SARS CORONAVIRUS 2 (TAT 6-24 HRS): SARS Coronavirus 2: POSITIVE — AB

## 2019-10-16 MED ORDER — ACETAMINOPHEN 325 MG PO TABS
650.0000 mg | ORAL_TABLET | Freq: Once | ORAL | Status: AC
  Administered 2019-10-16: 650 mg via ORAL
  Filled 2019-10-16: qty 2

## 2019-10-16 MED ORDER — METOCLOPRAMIDE HCL 5 MG/ML IJ SOLN
5.0000 mg | Freq: Once | INTRAMUSCULAR | Status: AC
  Administered 2019-10-16: 5 mg via INTRAVENOUS
  Filled 2019-10-16: qty 2

## 2019-10-16 MED ORDER — SODIUM CHLORIDE 0.9 % IV BOLUS
500.0000 mL | Freq: Once | INTRAVENOUS | Status: AC
  Administered 2019-10-16: 500 mL via INTRAVENOUS

## 2019-10-16 MED ORDER — DIPHENHYDRAMINE HCL 50 MG/ML IJ SOLN
12.5000 mg | Freq: Once | INTRAMUSCULAR | Status: AC
  Administered 2019-10-16: 12.5 mg via INTRAVENOUS
  Filled 2019-10-16: qty 1

## 2019-10-16 NOTE — ED Notes (Signed)
Two unsuccessful iv attempts

## 2019-10-16 NOTE — ED Triage Notes (Signed)
Pt states she has a headache, sore throat, and generalized body aches.  States she needs to go to bathroom prior to triage.

## 2019-10-16 NOTE — ED Notes (Signed)
Patient verbalizes understanding of discharge instructions. Opportunity for questioning and answers were provided. Armband removed by staff, pt discharged from ED.  

## 2019-10-16 NOTE — ED Triage Notes (Signed)
Pt from home w/ several c/o consisting of: headache, sore throat, body aches, nausea w/o vomiting, and a non-productive cough. Denies SOB, CP, or diarrhea. 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

## 2019-10-16 NOTE — Discharge Instructions (Addendum)
You have been diagnosed today with viral illness, suspected COVID-19 virus.  At this time there does not appear to be the presence of an emergent medical condition, however there is always the potential for conditions to change. Please read and follow the below instructions.  Please return to the Emergency Department immediately for any new or worsening symptoms. Please be sure to follow up with your Primary Care Provider within one week regarding your visit today; please call their office to schedule an appointment even if you are feeling better for a follow-up visit. Your COVID-19 test is pending and will result in 1-2 days.  Check your results on your MyChart account.  Continue to self quarantine until symptom-free x7 days even if your test is negative as there is a possibility of false negative testing.  Please be sure to drink plenty of water and get plenty of rest.  You may use over-the-counter anti-inflammatories such as Tylenol as directed on the packaging to help with your symptoms.  Get help right away if: You have trouble breathing. You have chest pain You have confusion You pass out You have a severe headache or a stiff neck. You have severe vomiting or abdominal pain. You have any new/concerning or worsening symptoms  Please read the additional information packets attached to your discharge summary.  Note: Portions of this text may have been transcribed using voice recognition software. Every effort was made to ensure accuracy; however, inadvertent computerized transcription errors may still be present.

## 2019-10-16 NOTE — ED Provider Notes (Signed)
[name] EMERGENCY DEPARTMENT Provider Note   CSN: [id number] Arrival date & time: 10/16/19  1547     History   Chief Complaint Chief Complaint  Patient presents with  . Headache  . Cough  . Generalized Body Aches    HPI Tara Rodriguez is a 48 y.o. female   Patient presents today with several complaints including body aches, headache, sore throat and nonproductive cough beginning today.  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  Patient reports that her symptoms started this morning.  She describes generalized body aches without focal pain, mild intermittent nonproductive cough, mild frontal headache "around the sinuses" similar to prior headaches nonradiating no aggravating or alleviating factors.  Sore throat is a mild bilateral burning sensation worse with swallowing improved with rest.  Patient took 2 rapid relief Tylenol around 9 AM this morning without improvement of her symptoms.  Denies fever/chills, vision changes, neck pain/stiffness, drooling, voice change, chest pain/shortness of breath, vomiting, diarrhea, dysuria/hematuria, numbness/weakness, extremity swelling/color change or any additional concerns.     HPI  Past Medical History:  Diagnosis Date  . Acrochordon    skin, left lower breast  . Allergy   . Anemia    history of  . Blood transfusion without reported diagnosis    during hysterectomy  . Family history of breast cancer   . Family history of colon cancer   . Migraine   . S/P hysterectomy    hx/o fibroids  . Wears glasses     Patient Active Problem List   Diagnosis Date Noted  . Chemotherapy-induced peripheral neuropathy ([place]) 11/15/2018  . Genetic testing 07/26/2018  . Port-A-Cath in place 07/12/2018  . Family history of breast cancer   . Family history of colon cancer    . Malignant neoplasm of upper-outer quadrant of right breast in female, estrogen receptor negative ([place]) 06/29/2018  . Numbness and tingling 06/21/2018  . Breast lump 06/21/2018  . Encounter for health maintenance examination in adult 10/15/2015  . Screening for breast cancer 10/15/2015  . Insomnia 10/15/2015  . Impaired fasting blood sugar 10/15/2015  . Low HDL (under 40) 10/15/2015  . Obesity 10/15/2015  . Perimenopausal 10/15/2015  . Cutaneous skin tags 10/15/2015  . Changing skin lesion 10/15/2015  . Dental plaque 10/15/2015    Past Surgical History:  Procedure Laterality Date  . ABDOMINAL HYSTERECTOMY  2009   total, due to fibroids  . BREAST LUMPECTOMY WITH RADIOACTIVE SEED AND SENTINEL LYMPH NODE BIOPSY Right 01/05/2019   Procedure: RIGHT BREAST LUMPECTOMY WITH RADIOACTIVE SEED AND SENTINEL LYMPH NODE BIOPSY;  Surgeon: Excell Seltzer, MD;  Location: [facility];  Service: General;  Laterality: Right;  . COLONOSCOPY  2009   due to anemia  . IR REMOVAL TUN ACCESS W/ PORT W/O FL MOD SED  06/27/2019  . PORTACATH PLACEMENT N/A 07/05/2018   Procedure: INSERTION PORT-A-CATH;  Surgeon: Excell Seltzer, MD;  Location: [facility];  Service: General;  Laterality: N/A;     OB History   No obstetric history on file.      Home Medications    Prior to Admission medications   Medication Sig Start Date End Date Taking? Authorizing Provider  acetaminophen (TYLENOL) 500 MG tablet Take 500 mg by mouth every 6 (six) hours as needed for mild pain.  [provider]  gabapentin (NEURONTIN) 300 MG capsule  08/19/19   [provider]  ibuprofen (ADVIL,MOTRIN) 800 MG tablet Take 1 tablet (800 mg total) by mouth every 8 (eight) hours as needed for mild pain. 02/06/19   Ward, Delice Bison, DO  naproxen sodium (ALEVE) 220 MG tablet Take 220 mg by mouth as needed (pain).     [provider]    Family History Family History  Problem Relation  Age of Onset  . Heart disease Mother        pacemaker  . Diabetes Mother   . Arthritis Mother   . Alcohol abuse Father   . Breast cancer Sister        63s  . Breast cancer Maternal Grandmother        dx 43, dx 32s  . Stroke Maternal Grandfather   . Colon cancer Paternal Grandmother   . Uterine cancer Paternal Grandmother   . Cancer Paternal Grandmother        unknown 3rd cancer  . Hypertension Neg Hx   . Hyperlipidemia Neg Hx     Social History Social History   Tobacco Use  . Smoking status: Never Smoker  . Smokeless tobacco: Never Used  Substance Use Topics  . Alcohol use: No  . Drug use: No     Allergies   Patient has no known allergies.   Review of Systems Review of Systems Ten systems are reviewed and are negative for acute change except as noted in the HPI   Physical Exam Updated Vital Signs BP (!) 152/91 (BP Location: Right Arm)   Pulse (!) 101   Temp 99.1 F (37.3 C) (Oral)   Resp 19   SpO2 100%   Physical Exam Constitutional:      General: She is not in acute distress.    Appearance: Normal appearance. She is well-developed. She is not ill-appearing or diaphoretic.  HENT:     Head: Normocephalic and atraumatic.     Jaw: There is normal jaw occlusion. No trismus.     Right Ear: External ear normal.     Left Ear: External ear normal.     Nose: Nose normal.     Mouth/Throat:     Mouth: Mucous membranes are moist.     Comments: The patient has normal phonation and is in control of secretions. No stridor.  Midline uvula without edema. Soft palate rises symmetrically.  Mild tonsillar erythema without swelling or exudates. Tongue protrusion is normal, floor of mouth is soft. No trismus. No creptius on neck palpation. No gingival erythema or fluctuance noted. Mucus membranes moist. Eyes:     General: Vision grossly intact. Gaze aligned appropriately.     Extraocular Movements: Extraocular movements intact.     Pupils: Pupils are equal, round, and  reactive to light.  Neck:     Musculoskeletal: Normal range of motion.     Trachea: Trachea and phonation normal. No tracheal deviation.     Meningeal: Brudzinski's sign and Kernig's sign absent.  Cardiovascular:     Rate and Rhythm: Normal rate and regular rhythm.  Pulmonary:     Effort: Pulmonary effort is normal. No respiratory distress.     Breath sounds: Normal breath sounds.  Abdominal:     General: There is no distension.     Palpations: Abdomen is soft.     Tenderness: There is no abdominal tenderness. There is no guarding or rebound.  Musculoskeletal: Normal range of motion.  Skin:  General: Skin is warm and dry.  Neurological:     Mental Status: She is alert.     GCS: GCS eye subscore is 4. GCS verbal subscore is 5. GCS motor subscore is 6.     Comments: Speech is clear and goal oriented, follows commands Major Cranial nerves without deficit, no facial droop Normal strength in upper and lower extremities bilaterally including dorsiflexion and plantar flexion, strong and equal grip strength Sensation normal to light and sharp touch Moves extremities without ataxia, coordination intact Normal finger to nose and rapid alternating movements Neg romberg, no pronator drift Normal gait  Psychiatric:        Behavior: Behavior normal.    ED Treatments / Results  Labs (all labs ordered are listed, but only abnormal results are displayed) Labs Reviewed  GROUP A STREP BY PCR  SARS CORONAVIRUS 2 (TAT 6-24 HRS)    EKG None  Radiology Dg Chest Portable 1 View  Result Date: 10/16/2019 CLINICAL DATA:  Headache, sore throat, body aches, cough EXAM: PORTABLE CHEST 1 VIEW COMPARISON:  02/06/2019 FINDINGS: The heart size and mediastinal contours are within normal limits. Both lungs are clear. The visualized skeletal structures are unremarkable. IMPRESSION: No acute abnormality of the lungs in AP portable projection. Electronically Signed   By: Eddie Candle M.D.   On: 10/16/2019  17:53    Procedures Procedures (including critical care time)  Medications Ordered in ED Medications  acetaminophen (TYLENOL) tablet 650 mg (650 mg Oral Given 10/16/19 1722)  diphenhydrAMINE (BENADRYL) injection 12.5 mg (12.5 mg Intravenous Given 10/16/19 1829)  metoCLOPramide (REGLAN) injection 5 mg (5 mg Intravenous Given 10/16/19 1829)  sodium chloride 0.9 % bolus 500 mL (500 mLs Intravenous New Bag/Given 10/16/19 1826)     Initial Impression / Assessment and Plan / ED Course  I have reviewed the triage vital signs and the nursing notes.  Pertinent labs & imaging results that were available during my care of the patient were reviewed by me and considered in my medical decision making (see chart for details).    48 year old female presents today with 1 day history of viral-like illness patient with body aches, sore throat, headache.  On initial evaluation patient is tired but well-appearing and in no acute distress.  Cranial nerves intact, no neuro deficit, no meningeal signs, HR RRR, L CTA B, abdomen soft nontender without peritoneal signs, NVI x4 without sign of DVT, no evidence of sinus infection, airway clear no sign of PTA, RPA, dental abscess or other deep space infections of the head or neck.  High suspicion for viral URI as etiology of symptoms.  Plan to obtain chest x-ray, strep test, Covid and treat headache and reassess. - Strep test negative COVID-19 pending Chest x-ray:  IMPRESSION:  No acute abnormality of the lungs in AP portable projection.  - Patient was treated with Reglan, Tylenol, Benadryl and IV fluids today.  On reassessment she is sitting on the edge of bed well-appearing and in no acute distress states improvement of headache and is requesting discharge.  On reassessment there are no focal neuro deficits, headache is not sudden onset, no history of syncope, no neck stiffness/meningeal signs she is not ill-appearing there is no history of trauma and no pain  about the temporal or cervical arteries.  Additionally she has no neurologic complaint such as visual changes, focal numbness/weakness, balance problems, confusion, or speech difficulty to suggest a life-threatening intracranial process such as intracranial hemorrhage or mass. The patient has no clotting risk  factors thus venous sinus thrombosis is unlikely. No fevers, neck pain or nuchal rigidity to suggest meningitis. Patient is afebrile, non-toxic and well appearing. Reassuring neuro exam, normal gait around room and back. No cranial deficits, no speech deficits, negative pronator drift, normal/equal strength to all extremities.   Patient aware of concern for possible viral infection today and to self quarantine, she states understanding that a COVID-19 test is pending and will result in the next 1-2 days, she is aware to continue quarantine despite test results until symptom-free x7 days in case of false negative testing.  I have reviewed return precautions including development of fever, nausea/vomiting or neurologic symptoms, vision changes, confusion, lethargy, difficulty speaking/walking, or other new/worsening/concerning symptoms. Patient states understanding of return precautions. Patient is agreeable to discharge.  At this time there does not appear to be any evidence of an acute emergency medical condition and the patient appears stable for discharge with appropriate outpatient follow up. Diagnosis was discussed with patient who verbalizes understanding of care plan and is agreeable to discharge. I have discussed return precautions with patient who verbalizes understanding of return precautions. Patient encouraged to follow-up with their PCP. All questions answered.  Patient's case discussed with Dr. Vanita Panda who agrees with plan to discharge with follow-up.   Note: Portions of this report may have been transcribed using voice recognition software. Every effort was made to ensure accuracy;  however, inadvertent computerized transcription errors may still be present. Final Clinical Impressions(s) / ED Diagnoses   Final diagnoses:  Suspected COVID-19 virus infection  Viral illness    ED Discharge Orders    None       Gari Crown 10/16/19 1917    Carmin Muskrat, MD 10/16/19 2342

## 2019-10-17 ENCOUNTER — Encounter: Payer: Self-pay | Admitting: Medical

## 2019-10-17 ENCOUNTER — Ambulatory Visit (INDEPENDENT_AMBULATORY_CARE_PROVIDER_SITE_OTHER): Payer: 59 | Admitting: Medical

## 2019-10-17 VITALS — Ht 70.0 in | Wt 213.0 lb

## 2019-10-17 DIAGNOSIS — R05 Cough: Secondary | ICD-10-CM

## 2019-10-17 DIAGNOSIS — R059 Cough, unspecified: Secondary | ICD-10-CM | POA: Insufficient documentation

## 2019-10-17 DIAGNOSIS — Z171 Estrogen receptor negative status [ER-]: Secondary | ICD-10-CM

## 2019-10-17 DIAGNOSIS — G620 Drug-induced polyneuropathy: Secondary | ICD-10-CM | POA: Diagnosis not present

## 2019-10-17 DIAGNOSIS — C50411 Malignant neoplasm of upper-outer quadrant of right female breast: Secondary | ICD-10-CM

## 2019-10-17 DIAGNOSIS — J029 Acute pharyngitis, unspecified: Secondary | ICD-10-CM

## 2019-10-17 DIAGNOSIS — U071 COVID-19: Secondary | ICD-10-CM

## 2019-10-17 DIAGNOSIS — T451X5A Adverse effect of antineoplastic and immunosuppressive drugs, initial encounter: Secondary | ICD-10-CM

## 2019-10-17 NOTE — Progress Notes (Signed)
Mailed AVS and work note
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
## 2019-10-17 NOTE — Patient Instructions (Signed)
Your recent coronavirus/Covid 19 test result was indeed positive!    General recommendations: I recommend you rest, hydrate well with water and clear fluids throughout the day.   You can use Tylenol for pain or fever You can use over the counter Mucinex DM for cough or Benadryl is another option. You can use over the counter Emetrol for nausea.     If you are having trouble breathing, if you are very weak, have high fever 103 or higher consistently despite Tylenol, or uncontrollable nausea and vomiting, then call or go to the emergency department.    Covid symptoms such as fatigue and cough can linger over 2 weeks, even after the initial fever, aches, chills, and other initial symptoms.   Self Quarantine: The CDC, Centers for Disease Control has recommended a self quarantine of 10 days from the start of your illness until you are symptom-free including at least 24 hours of no symptoms including no fever, no shortness of breath, and no body aches and chills, by day 10 before returning to work or general contact with the public.  What does self quarantine mean: avoiding contact with people as much as possible.   Particularly in your house, isolate your self from others in a separate room, wear a mask when possible in the room, particularly if coughing a lot.   Have others bring food, water, medications, etc., to your door, but avoid direct contact with your household contacts during this time to avoid spreading the infection to them.   If you have a separate bathroom and living quarters during the next 2 weeks away from others, that would be preferable.    If you can't completely isolate, then wear a mask, wash hands frequently with soap and water for at least 15 seconds, minimize close contact with others, and have a friend or family member check regularly from a distance to make sure you are not getting seriously worse.     You should not be going out in public, should not be going to stores, to  work or other public places until all your symptoms have resolved and at least 10 days + 24 hours of no symptoms at all have transpired.   Ideally you should avoid contact with others for a full 10 days if possible.  One of the goals is to limit spread to high risk people; people that are older and elderly, people with multiple health issues like diabetes, heart disease, lung disease, and anybody that has weakened immune systems such as people with cancer or on immunosuppressive therapy.

## 2019-10-21 ENCOUNTER — Ambulatory Visit: Payer: 59 | Admitting: Podiatry

## 2019-10-25 ENCOUNTER — Other Ambulatory Visit: Payer: Self-pay | Admitting: Neurosurgery

## 2019-10-25 DIAGNOSIS — G8929 Other chronic pain: Secondary | ICD-10-CM

## 2019-11-09 MED FILL — GABAPENTIN 300 MG CAPSULE: 300 | 30 days supply | Qty: 90 | Fill #1

## 2019-11-11 ENCOUNTER — Encounter: Payer: Self-pay | Admitting: Podiatry

## 2019-11-11 ENCOUNTER — Other Ambulatory Visit: Payer: Self-pay

## 2019-11-11 ENCOUNTER — Ambulatory Visit (INDEPENDENT_AMBULATORY_CARE_PROVIDER_SITE_OTHER): Payer: 59 | Admitting: Podiatry

## 2019-11-11 DIAGNOSIS — M79671 Pain in right foot: Secondary | ICD-10-CM

## 2019-11-11 DIAGNOSIS — M722 Plantar fascial fibromatosis: Secondary | ICD-10-CM

## 2019-11-11 DIAGNOSIS — M79672 Pain in left foot: Secondary | ICD-10-CM | POA: Diagnosis not present

## 2019-11-11 DIAGNOSIS — G5751 Tarsal tunnel syndrome, right lower limb: Secondary | ICD-10-CM | POA: Diagnosis not present

## 2019-11-11 NOTE — Progress Notes (Signed)
Subjective:  Patient ID: Tara Rodriguez, female    DOB: Sep 21, 1971,  MRN: [id number]  Chief Complaint  Patient presents with  . Foot Pain    pt is here for a f/u on bil foot/ and ankle pain f/u, pt states that the foot pain has gotten worse, pt also states that the injection she recieved yesterday has not helped    48 y.o. female presents with the above complaint.  Patient presents with a follow-up on bilateral tarsal tunnel syndrome with nerve block injection that was given.  She states that there is still pain.  She states the block worked for a little bit but once it wore off the pain came back.  She states that her pain is 5 out of 10.  I also asked if she had follow-up with a neurologist or nerve related doctor.  Patient states that she followed up with a neurologist who had told her to go to physical therapy to help with the nerve related pain.  She states it does help a little bit but she still has some numbness tingling that is coming from her lower back.  She has a new acute problem with left plantar heel pain.  This is new onset and acute in onset.  She states it hurts when she is taking the first step when she gets out of bed.  The pain is unbearable and it gradually gets little better as she continues to walk however it is still always there.  She has not tried anything for this pain she has not tried any bracing she has not seen anyone else for this pain.  Review of Systems: Negative except as noted in the HPI. Denies N/V/F/Ch.  Past Medical History:  Diagnosis Date  . Acrochordon    skin, left lower breast  . Allergy   . Anemia    history of  . Blood transfusion without reported diagnosis    during hysterectomy  . Family history of breast cancer   . Family history of colon cancer   . Migraine   . S/P hysterectomy    hx/o fibroids  . Wears glasses     Current Outpatient Medications:  .  acetaminophen (TYLENOL) 500 MG tablet, Take 500 mg by mouth every 6 (six) hours as  needed for mild pain. , Disp: , Rfl:  .  gabapentin (NEURONTIN) 300 MG capsule, , Disp: , Rfl:  .  ibuprofen (ADVIL,MOTRIN) 800 MG tablet, Take 1 tablet (800 mg total) by mouth every 8 (eight) hours as needed for mild pain., Disp: 30 tablet, Rfl: 0 .  naproxen sodium (ALEVE) 220 MG tablet, Take 220 mg by mouth as needed (pain). , Disp: , Rfl:   Social History   Tobacco Use  Smoking Status Never Smoker  Smokeless Tobacco Never Used    No Known Allergies Objective:   There were no vitals filed for this visit. There is no height or weight on file to calculate BMI. Constitutional Well developed. Well nourished.  Vascular Dorsalis pedis pulses palpable bilaterally. Posterior tibial pulses palpable bilaterally. Capillary refill normal to all digits.  No cyanosis or clubbing noted. Pedal hair growth normal.  Neurologic Normal speech. Oriented to person, place, and time. Positive Tinel's sign at the tarsal tunnel region.  Positive Tinel's sign at common peroneal region.  The toenail side on the dorsal aspect of the foot.  These findings are both equal to bilateral feet.  There is decrease in protective sensation.  Symes monofilament testing positive.  Dermatologic Nails well groomed and normal in appearance. No open wounds. No skin lesions.  Orthopedic:  Pain on palpation to the medial calcaneal tuber.  Pain with dorsiflexion of all the toes.  No pain on lateral squeeze of the calcaneus.  No pain at the posterior tibial tendinitis.   Radiographs: None Assessment:   No diagnosis found. Plan:  Patient was evaluated and treated and all questions answered.  Bilateral tarsal tunnel syndrome -Patient states that she still has some pain after the injection wore off.  However given that this is coming from proximal region likely the back.  Patient states that she had seen neurologist and neurologist is working her up to evaluate this pain.  I will defer further management of tarsal tunnel  syndrome and lower extremity nerve related pain to neurology.  Left plantar fasciitis -I explained to the patient the etiology of plantar fasciitis and various treatment options associated with it.  I believe patient will benefit from steroid injection to the left plantar heel to help decrease the acute inflammation.  I explained to her that she will need to do the stretching exercises.  I also dispensed her an plantar fascial brace to help support the arch of her foot as well as take the pressure off the plantar fascia.  No follow-ups on file.

## 2019-12-09 ENCOUNTER — Ambulatory Visit: Payer: 59 | Admitting: Podiatry

## 2020-02-03 MED FILL — GABAPENTIN 300 MG CAPSULE: 300 | 30 days supply | Qty: 90 | Fill #2

## 2020-02-11 IMAGING — MR MR BILATERAL BREAST WITHOUT AND WITH CONTRAST
5 of 12 series · 19 of 48 positions shown · IV contrast (Yes)
Comparison: Previous exam(s).

CLINICAL DATA: 46-year-old female with biopsy proven grade 3
invasive ductal carcinoma in the lateral right breast.

LABS:  06/30/2018:
BUN 7
Creatinine
GFR > 60
EXAM:
BILATERAL BREAST MRI WITH AND WITHOUT CONTRAST
TECHNIQUE: Multiplanar, multisequence MR images of both breasts were obtained
prior to and following the intravenous administration of 18 ml of
MultiHance.

[Series 1: acess#(phone_number) · axial · 7.0mm · 1.56mm/px · 1 of 25 slices shown]
[im 1/25]
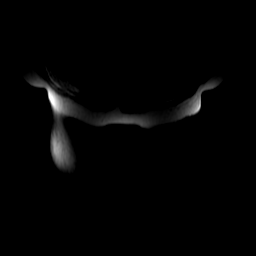

[Series 4: ax ir · axial · 3.0mm · 0.62mm/px · 1 of 65 slices shown]
[im 1/65]
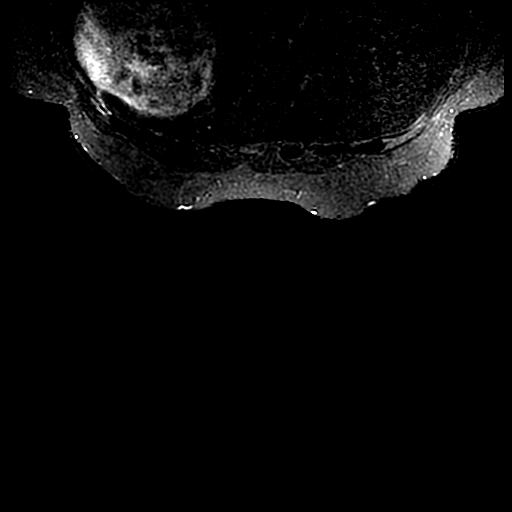

[Series 600: 18ml vibrant mph · axial · 1.8mm · 0.62mm/px · z∈[-99,+94]mm · 5 of 216 slices shown]
[im 1/216]
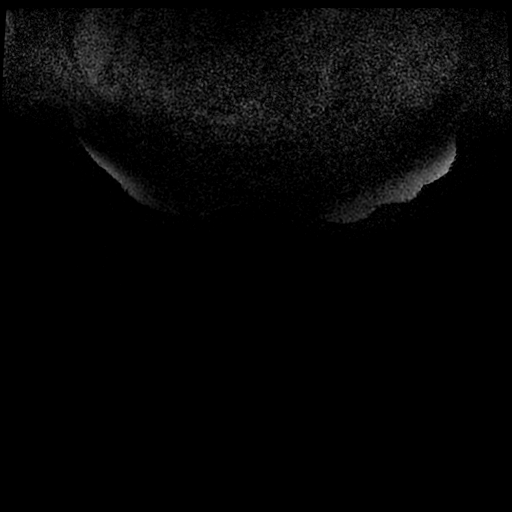
[im 54/216]
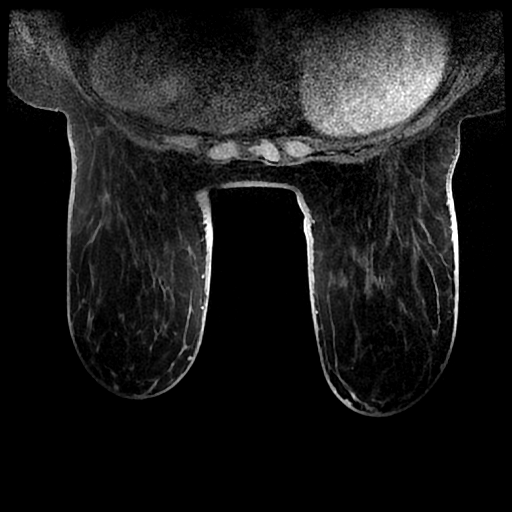
[im 108/216]
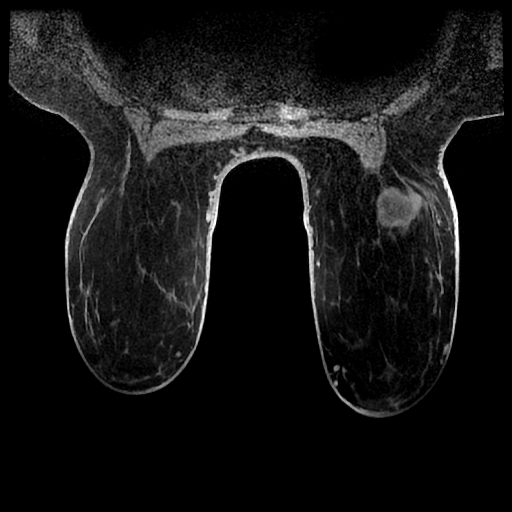
[im 162/216]
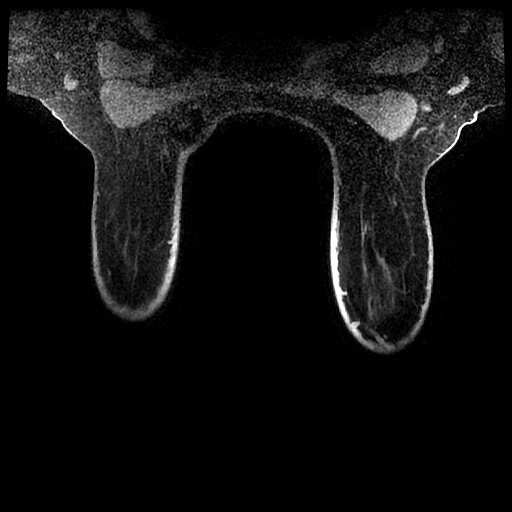
[im 216/216]
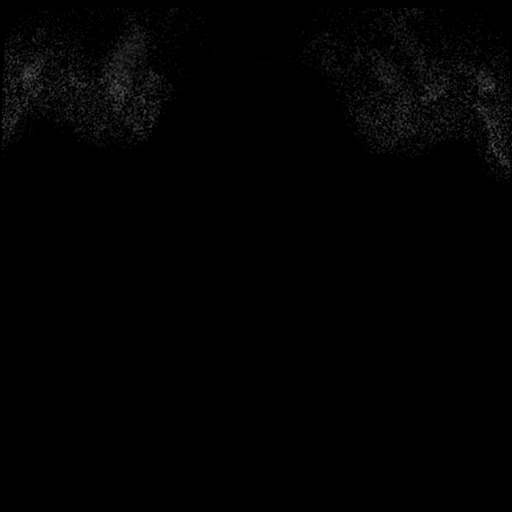

[Series 601: ph1/18ml vibrant mph · axial · 1.8mm · 0.62mm/px · z∈[-99,+94]mm · 6 of 216 slices shown]
[im 1/216]
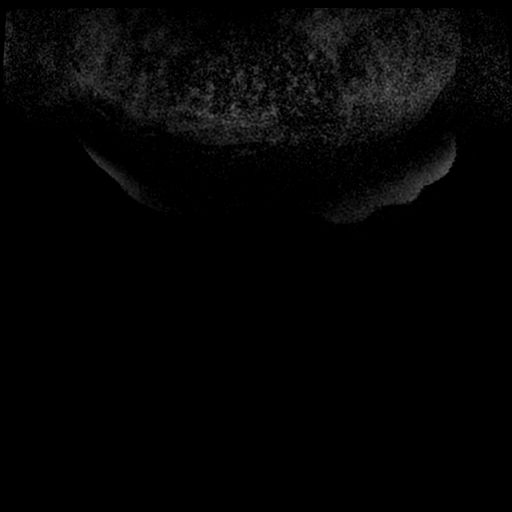
[im 44/216]
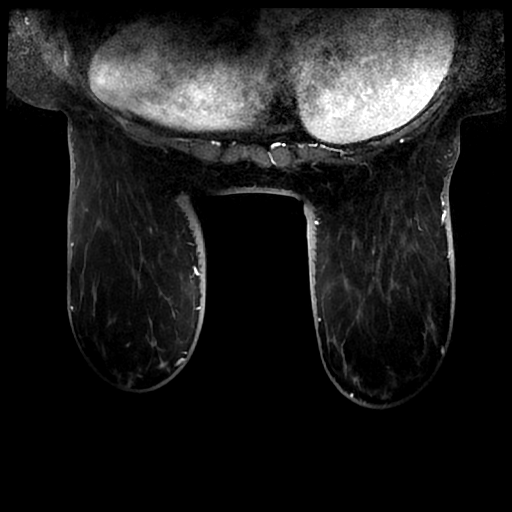
[im 87/216]
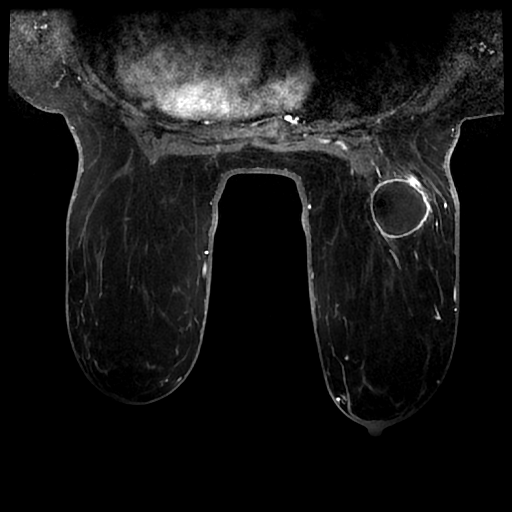
[im 130/216]
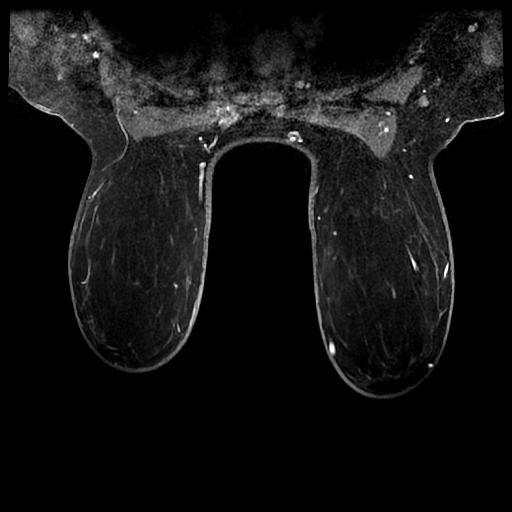
[im 173/216]
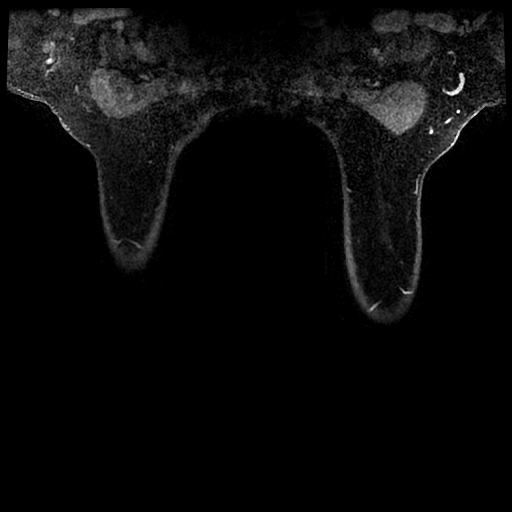
[im 216/216]
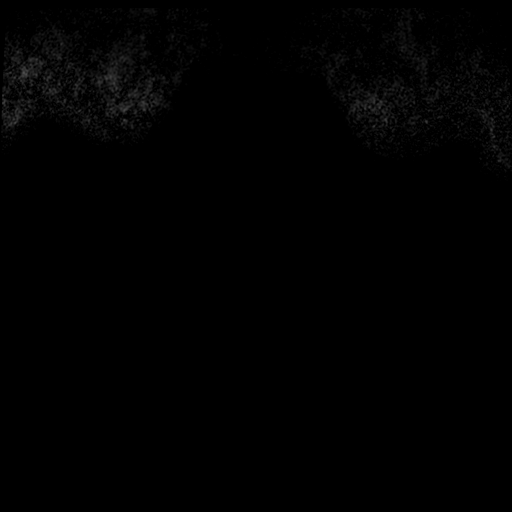

[Series 602: ph2/18ml vibrant mph · axial · 1.8mm · 0.62mm/px · z∈[-99,+94]mm · 6 of 216 slices shown]
[im 1/216]
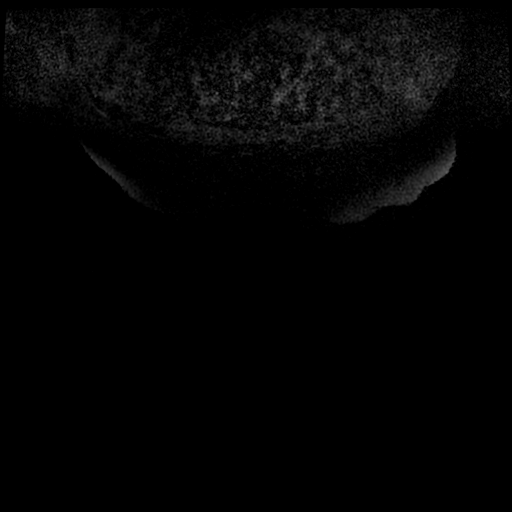
[im 44/216]
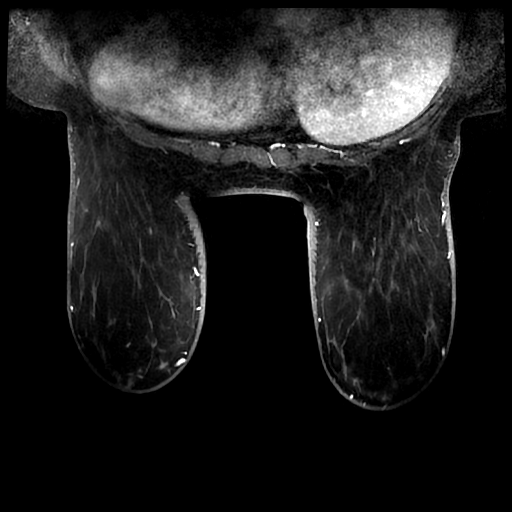
[im 87/216]
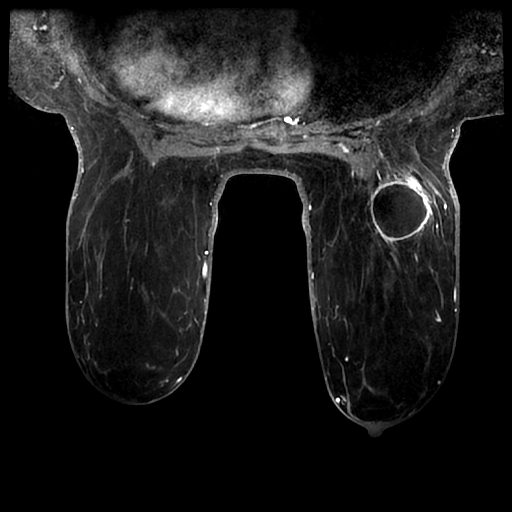
[im 130/216]
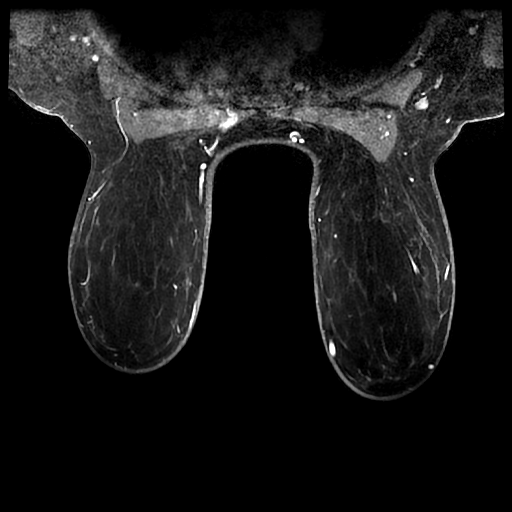
[im 173/216]
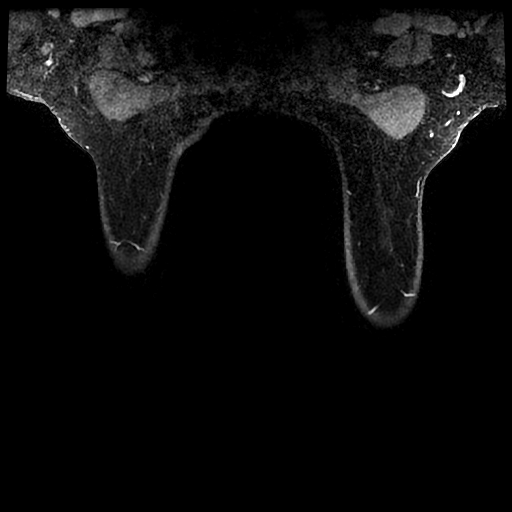
[im 216/216]
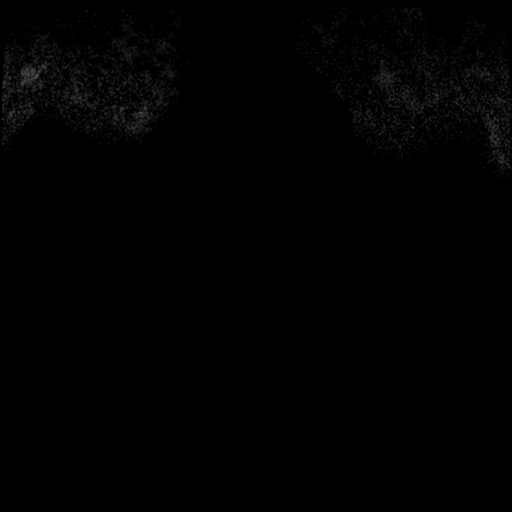

[19 of 48 positions shown; findings below may reference images not displayed]

THREE-DIMENSIONAL MR IMAGE RENDERING ON INDEPENDENT WORKSTATION:

Three-dimensional MR images were rendered by post-processing of the
original MR data on an independent workstation. The
three-dimensional MR images were interpreted, and findings are
reported in the following complete MRI report for this study. Three
dimensional images were evaluated at the independent [employer]
workstation
FINDINGS: Breast composition: a. Almost entirely fat.

Background parenchymal enhancement: Minimal.

Right breast: A 4.7 x 4.1 x 3.9 cm (craniocaudal by transverse by AP
dimensions) rim enhancing mass is identified in the central lateral
right breast at posterior depth. An enhancing nodular component is
seen along the wall at the posterolateral aspect. This measures
x 0.8 x 0.8 cm (craniocaudal by transverse by AP dimensions). The
mass demonstrates T2 hyperintensity suggesting fluid contents. Note
is made of associated signal void from a post biopsy tissue marker.

No additional suspicious mass or abnormal non mass enhancement is
identified.

Left breast: No mass or abnormal enhancement. A subcentimeter
enhancing mass in the upper outer quadrant at middle depth
demonstrates a reniform shape, T2 hyperintensity and fatty hilum on
T1 weighted imaging. This is consistent with an intramammary lymph
node.

Lymph nodes: No abnormal appearing lymph nodes.

Ancillary findings:  None.
IMPRESSION: 1. 4.7 x 4.1 x 3.9 cm rim enhancing, fluid containing mass in the
central lateral right breast consistent with the patient's
biopsy-proven malignancy. A prominent 1.6 x 0.8 x 0.8 cm enhancing
nodular component is demonstrated along its posterolateral wall.
2. No suspicious MRI findings in the left breast.

RECOMMENDATION:
Per treatment plan.

BI-RADS CATEGORY  6: Known biopsy-proven malignancy.

## 2020-02-17 ENCOUNTER — Ambulatory Visit (INDEPENDENT_AMBULATORY_CARE_PROVIDER_SITE_OTHER): Payer: 59 | Admitting: Medical

## 2020-02-17 ENCOUNTER — Encounter: Payer: Self-pay | Admitting: Medical

## 2020-02-17 ENCOUNTER — Other Ambulatory Visit: Payer: Self-pay

## 2020-02-17 VITALS — BP 128/80 | HR 64 | Temp 98.0°F | Ht 70.0 in | Wt 212.4 lb

## 2020-02-17 DIAGNOSIS — Z136 Encounter for screening for cardiovascular disorders: Secondary | ICD-10-CM

## 2020-02-17 DIAGNOSIS — Z803 Family history of malignant neoplasm of breast: Secondary | ICD-10-CM

## 2020-02-17 DIAGNOSIS — R202 Paresthesia of skin: Secondary | ICD-10-CM

## 2020-02-17 DIAGNOSIS — G629 Polyneuropathy, unspecified: Secondary | ICD-10-CM | POA: Insufficient documentation

## 2020-02-17 DIAGNOSIS — Z1211 Encounter for screening for malignant neoplasm of colon: Secondary | ICD-10-CM

## 2020-02-17 DIAGNOSIS — Z1239 Encounter for other screening for malignant neoplasm of breast: Secondary | ICD-10-CM | POA: Diagnosis not present

## 2020-02-17 DIAGNOSIS — R7301 Impaired fasting glucose: Secondary | ICD-10-CM

## 2020-02-17 DIAGNOSIS — G4700 Insomnia, unspecified: Secondary | ICD-10-CM

## 2020-02-17 DIAGNOSIS — Z853 Personal history of malignant neoplasm of breast: Secondary | ICD-10-CM | POA: Insufficient documentation

## 2020-02-17 DIAGNOSIS — Z800 Family history of malignant neoplasm of digestive organs: Secondary | ICD-10-CM

## 2020-02-17 DIAGNOSIS — Z1231 Encounter for screening mammogram for malignant neoplasm of breast: Secondary | ICD-10-CM

## 2020-02-17 DIAGNOSIS — Z7185 Encounter for immunization safety counseling: Secondary | ICD-10-CM

## 2020-02-17 DIAGNOSIS — Z0000 Encounter for general adult medical examination without abnormal findings: Secondary | ICD-10-CM | POA: Diagnosis not present

## 2020-02-17 DIAGNOSIS — E786 Lipoprotein deficiency: Secondary | ICD-10-CM

## 2020-02-17 DIAGNOSIS — Z7189 Other specified counseling: Secondary | ICD-10-CM

## 2020-02-17 DIAGNOSIS — L918 Other hypertrophic disorders of the skin: Secondary | ICD-10-CM

## 2020-02-17 DIAGNOSIS — R200 Anesthesia of skin: Secondary | ICD-10-CM

## 2020-02-17 DIAGNOSIS — E669 Obesity, unspecified: Secondary | ICD-10-CM

## 2020-02-17 NOTE — Patient Instructions (Signed)
I am glad you have cleared the breast cancer    Recommendations See your eye doctor and dentist regularly yearly  See me for a physical yearly    Schedule your updated mammogram  [facility]. [address] [place], [place] [address]    We are referring you for colonoscopy   Follow-up with neurology about the neuropathy   Return your convenience to cut off the skin tag in your pubic region   I recommend you cut back on sweets, eat a healthy low-fat diet, exercise regimen, and work on losing some weight   Shingles vaccine:  I recommend you have a shingles vaccine to help prevent shingles or herpes zoster outbreak.   Please call your insurer to inquire about coverage for the Shingrix vaccine given in 2 doses.   Some insurers cover this vaccine after age 51, some cover this after age 51.  If your insurer covers this, then call to schedule appointment to have this vaccine here.    Exercise: I recommend exercising most days of the week using a type of exercise that you would enjoy and stick to such as walking, running, swimming, hiking, biking, aerobics, etc. This needs to be at least 30-40 minutes at a time, at least 5 days/week with moderate intensity.  This would be 60-70% of your maximum heart rate.  For example, your maximal heart rate would be 200- your age, multiplied x 0.6 to equal 60% of your maximal heart rate.    Thus, a person age 51 would have a maximum heart rate of 160.  60% of this would be 96 beats per minute.  Thus for fat burning exercise, a 49 year old would want to exercise has sustained heart rate of about 96 bpm for fat burning.  However for heart health, they would want to exercise at a higher heart rate of about 75% of maximum heart rate which would be a heart rate of about 120 beats per minute.  So I recommend a combination of doing some exercise at moderate intensity, and some exercise at vigorous  intensity for heart health.   Low Carb Diet recommendations:  I recommend you drink water throughout the day.   70 ounces or 2 liters would be a good amount.  If you have been accustomed to drinking juice or soda, try water with lemon or water with lime, or try using no calorie flavor dropper.  I recommend the following as an example meal plan that includes 3 meals per day.   You can skip some meals periodically for intermittent fasting.  Breakfast (choose one): Marland Kitchen Omelette with egg, can include a little bit of cheese, your choice of mushrooms, peppers, onions, salsa . Smoothie with handful of spinach or kale, 1 cup of milk or water, 1 cup of berries, 1 packet of artificial sweetener such as stevia . Yogurt with fruit   Mid morning snack: . 1 fruit serving and 1 protein such as 8 almonds or 8 nuts, or vegetable such as carrots and humus or other similar vegetable   Lunch: . Salad with 3-4 ounces of lean grilled or baked meat such as fish, chicken or [place]   Mid afternoon snack: . 1 fruit serving and 1 protein such as 8 almonds or 8 nuts, or vegetable such as carrots and humus or other similar vegetable   Dinner: . Large serving of vegetables and 3-4 ounces of lean grilled or baked meat such as fish, chicken  or [place] . Or vegetarian dish without meat    Avoid  . Chips, cookies, cake, donuts, soda, sweet tea, juices, candy, fast food . For now , avoid, or significantly limit grains

## 2020-02-17 NOTE — Progress Notes (Signed)
Subjective:   HPI  Tara Rodriguez is a 49 y.o. female who presents for Chief Complaint  Patient presents with  . Annual Exam    with fasting labs    Patient Care Team: Ruben Pyka, Camelia Eng, PA-C as PCP - General (Family Medicine) Excell Seltzer, MD (Inactive) as Consulting Physician (General Surgery) Nicholas Lose, MD as Consulting Physician (Hematology and Oncology) Gery Pray, MD as Consulting Physician (Radiation Oncology) Sees dentist Sees eye doctor  Concerns: Has breast cancer 2019, s/p chemo, radiation.  Developed right areolar thickened tissue after radiation burn, and developed neuropathy in hands feet and forearm and lower legs after treatment.   Didn't tolerate lyrica.  Is on gabapentin 300mg  TID but not improved.   Sees neurology for consult in about 2 weeks.  Gets pins and needles feeling up arms/forearms and lower legs.    Sees podiatry, has plantar fascitis.   Saw foot and ankle doctor for this recently.   Works as a Quarry manager, lately hard to walk and stand due to these symptoms above  Seeing back doctor, was put on prednisone recently for back flare up  Has enlarging mole on lower abdomen.  Got covid vaccine recently.   Got "sick" with covid vaccine but doing fine now.    Past Medical History:  Diagnosis Date  . Acrochordon    skin, left lower breast  . Allergy   . Anemia    history of  . Blood transfusion without reported diagnosis    during hysterectomy  . Family history of breast cancer   . Family history of colon cancer   . Migraine   . S/P hysterectomy    hx/o fibroids  . Wears glasses     Past Surgical History:  Procedure Laterality Date  . ABDOMINAL HYSTERECTOMY  2009   total, due to fibroids  . BREAST LUMPECTOMY WITH RADIOACTIVE SEED AND SENTINEL LYMPH NODE BIOPSY Right 01/05/2019   Procedure: RIGHT BREAST LUMPECTOMY WITH RADIOACTIVE SEED AND SENTINEL LYMPH NODE BIOPSY;  Surgeon: Excell Seltzer, MD;  Location: [facility];   Service: General;  Laterality: Right;  . COLONOSCOPY  2009   due to anemia  . IR REMOVAL TUN ACCESS W/ PORT W/O FL MOD SED  06/27/2019  . PORTACATH PLACEMENT N/A 07/05/2018   Procedure: INSERTION PORT-A-CATH;  Surgeon: Excell Seltzer, MD;  Location: [facility];  Service: General;  Laterality: N/A;    Social History   Socioeconomic History  . Marital status: Single    Spouse name: Not on file  . Number of children: Not on file  . Years of education: Not on file  . Highest education level: Not on file  Occupational History  . Not on file  Tobacco Use  . Smoking status: Never Smoker  . Smokeless tobacco: Never Used  Substance and Sexual Activity  . Alcohol use: No  . Drug use: No  . Sexual activity: Not on file  Other Topics Concern  . 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   01/2020   Social Determinants of Health   Financial Resource Strain:   . Difficulty of Paying Living Expenses:   Food Insecurity:   . Worried About Charity fundraiser in the Last Year:   . Arboriculturist in the Last Year:   Transportation Needs:   . Film/video editor (Medical):   Marland Kitchen Lack of  Transportation (Non-Medical):   Physical Activity:   . Days of Exercise per Week:   . Minutes of Exercise per Session:   Stress:   . Feeling of Stress :   Social Connections:   . Frequency of Communication with Friends and Family:   . Frequency of Social Gatherings with Friends and Family:   . Attends Religious Services:   . Active Member of Clubs or Organizations:   . Attends Archivist Meetings:   Marland Kitchen Marital Status:   Intimate Partner Violence:   . Fear of Current or Ex-Partner:   . Emotionally Abused:   Marland Kitchen Physically Abused:   . Sexually Abused:     Family History  Problem Relation Age of Onset  . Heart disease Mother        pacemaker  . Diabetes Mother   .  Arthritis Mother   . Alcohol abuse Father   . Breast cancer Sister        62s  . Breast cancer Maternal Grandmother        dx 56, dx 87s  . Stroke Maternal Grandfather   . Colon cancer Paternal Grandmother   . Uterine cancer Paternal Grandmother   . Cancer Paternal Grandmother        unknown 3rd cancer  . Hypertension Neg Hx   . Hyperlipidemia Neg Hx      Current Outpatient Medications:  .  gabapentin (NEURONTIN) 300 MG capsule, 300 mg 3 (three) times daily. , Disp: , Rfl:  .  methylPREDNISolone (MEDROL DOSEPAK) 4 MG TBPK tablet, TAKE ORALLY AS DIRECTED, Disp: , Rfl:  .  acetaminophen (TYLENOL) 500 MG tablet, Take 500 mg by mouth every 6 (six) hours as needed for mild pain. , Disp: , Rfl:  .  ibuprofen (ADVIL,MOTRIN) 800 MG tablet, Take 1 tablet (800 mg total) by mouth every 8 (eight) hours as needed for mild pain. (Patient not taking: Reported on 02/17/2020), Disp: 30 tablet, Rfl: 0 .  naproxen sodium (ALEVE) 220 MG tablet, Take 220 mg by mouth as needed (pain). , Disp: , Rfl:   No Known Allergies    Reviewed their medical, surgical, family, social, medication, and allergy history and updated chart as appropriate.   Review of Systems Constitutional: -fever, -chills, -sweats, -unexpected weight change, -decreased appetite, -fatigue Allergy: -sneezing, -itching, -congestion Dermatology: +changing moles, --rash, -lumps ENT: -runny nose, -ear pain, -sore throat, -hoarseness, -sinus pain, -teeth pain, - ringing in ears, -hearing loss, -nosebleeds Cardiology: -chest pain, -palpitations, -swelling, -difficulty breathing when lying flat, -waking up short of breath Respiratory: -cough, -shortness of breath, -difficulty breathing with exercise or exertion, -wheezing, -coughing up blood Gastroenterology: -abdominal pain, -nausea, -vomiting, -diarrhea, -constipation, -blood in stool, -changes in bowel movement, -difficulty swallowing or eating Hematology: -bleeding, -bruising   Musculoskeletal: -joint aches, -muscle aches, -joint swelling, -back pain, -neck pain, -cramping, -changes in gait Ophthalmology: denies vision changes, eye redness, itching, discharge Urology: -burning with urination, -difficulty urinating, -blood in urine, -urinary frequency, -urgency, -incontinence Neurology: -headache, -weakness, +tingling, +numbness, -memory loss, -falls, -dizziness Psychology: -depressed mood, -agitation, -sleep problems Breast/gyn: -breast tendnerss, -discharge, -lumps, -vaginal discharge,- irregular periods, -heavy periods     Objective:  BP 128/80   Pulse 64   Temp 98 F (36.7 C)   Ht 5\' 10"  (1.778 m)   Wt 212 lb 6.4 oz (96.3 kg)   SpO2 99%   BMI 30.48 kg/m   General appearance: alert, no distress, WD/WN, African American female Skin: superior mons area with pedunculated fleshy skin  tag, no other worrisome lesions Neck: supple, no lymphadenopathy, no thyromegaly, no masses, normal ROM, no bruits Chest: left upper chest linear surgical scar from prior port a cath,  non tender, normal shape and expansion Heart: RRR, normal S1, S2, no murmurs Lungs: CTA bilaterally, no wheezes, rhonchi, or rales Abdomen: +bs, soft, lower vertical surgical scar, non tender, non distended, no masses, no hepatomegaly, no splenomegaly, no bruits Back: non tender, normal ROM, no scoliosis Musculoskeletal: upper extremities non tender, no obvious deformity, normal ROM throughout, lower extremities non tender, no obvious deformity, normal ROM throughout Extremities: no edema, no cyanosis, no clubbing Pulses: 2+ symmetric, upper and lower extremities, normal cap refill Neurological:  Normal UE and LE strength, DTRs seem blunted, sensation seems WNL to light and sharp touch, alert, oriented x 3, CN2-12 intact, strength normal upper extremities and lower extremities, sensation normal throughout, DTRs 2+ throughout, no cerebellar signs, gait normal Psychiatric: normal affect, behavior  normal, pleasant  Breast: nontender, no masses or lumps, no skin changes, no nipple discharge or inversion, no axillary lymphadenopathy Gyn: Normal external genitalia without lesions, vagina with normal mucosa, s/p hysterectomy, no abnormal vaginal discharge.   adnexa not enlarged, nontender, no masses.   Exam chaperoned by nurse. Rectal: anus normal appearing  EKG  indication physical and screen heart disease, rate 59 bpm, PR 172 ms, QRS 94 ms, QTC 409 ms, axis 74 degrees, sinus bradycardia, otherwise no acute or worrisome changes    Assessment and Plan :   Encounter Diagnoses  Name Primary?  . Encounter for health maintenance examination in adult Yes  . Impaired fasting blood sugar   . Encounter for screening for malignant neoplasm of breast, unspecified screening modality   . Low HDL (under 40)   . Insomnia, unspecified type   . Numbness and tingling   . Family history of breast cancer   . Family history of colon cancer   . Obesity without serious comorbidity, unspecified classification, unspecified obesity type   . Screening for heart disease   . Vaccine counseling   . Neuropathy   . History of breast cancer   . Screen for colon cancer   . Skin tag     Physical exam - discussed and counseled on healthy lifestyle, diet, exercise, preventative care, vaccinations, sick and well care, proper use of emergency dept and after hours care, and addressed their concerns.    Health screening: Advised they see their eye doctor yearly for routine vision care. Advised they see their dentist yearly for routine dental care including hygiene visits twice yearly.  Cancer screening Counseled on self breast exams, mammograms, cervical cancer screening  Colonoscopy:  Referred for screening colonoscopy  Pelvic exam today, s/p hysterectomy  Discussed skin surveillance   Vaccinations: Advised yearly influenza vaccine She just got both covid vaccine doses recently  Shingles vaccine:  I  recommend you have a shingles vaccine to help prevent shingles or herpes zoster outbreak.   Please call your insurer to inquire about coverage for the Shingrix vaccine given in 2 doses.   Some insurers cover this vaccine after age 38, some cover this after age 6.  If your insurer covers this, then call to schedule appointment to have this vaccine here.  She is up to date on tetanus vaccine   Separate significant chronic issues discussed: Neuropathy status post radiation and chemotherapy, on gabapentin, has neurology consult in about 2 weeks due to not improving  History of breast cancer-advised updated mammogram since his been over  a year.  She may be due for follow-up with oncology but she has not seen them in a while.  She is status post radiation and chemotherapy and lumpectomy.  Right breast  Family history of colon cancer-referred for colonoscopy  Obesity-counseled on need for weight loss through healthy diet and exercise  Screen for heart disease-EKG reviewed today  Impaired glucose-labs today, counseled on diet,  she is eating a lot of sweets  Skin tag-return at her convenience for excision  Mariquita was seen today for annual exam.  Diagnoses and all orders for this visit:  Encounter for health maintenance examination in adult -     Comprehensive metabolic panel -     CBC with Differential/Platelet -     TSH -     Hemoglobin A1c -     VITAMIN D 25 Hydroxy (Vit-D Deficiency, Fractures) -     EKG 12-Lead -     Vitamin B12  Impaired fasting blood sugar -     Hemoglobin A1c  Encounter for screening for malignant neoplasm of breast, unspecified screening modality  Low HDL (under 40)  Insomnia, unspecified type  Numbness and tingling  Family history of breast cancer  Family history of colon cancer -     Ambulatory referral to Gastroenterology  Obesity without serious comorbidity, unspecified classification, unspecified obesity type  Screening for heart disease -      EKG 12-Lead  Vaccine counseling  Neuropathy -     Vitamin B12  History of breast cancer  Screen for colon cancer -     Ambulatory referral to Gastroenterology  Skin tag    Follow-up pending labs, yearly for physical

## 2020-02-18 LAB — CBC WITH DIFFERENTIAL/PLATELET
Basophils Absolute: 0 10*3/uL (ref 0.0–0.2)
Basos: 0 %
EOS (ABSOLUTE): 0 10*3/uL (ref 0.0–0.4)
Eos: 0 %
Hematocrit: 40.8 % (ref 34.0–46.6)
Hemoglobin: 13.5 g/dL (ref 11.1–15.9)
Immature Grans (Abs): 0 10*3/uL (ref 0.0–0.1)
Immature Granulocytes: 0 %
Lymphocytes Absolute: 4.6 10*3/uL — ABNORMAL HIGH (ref 0.7–3.1)
Lymphs: 53 %
MCH: 30 pg (ref 26.6–33.0)
MCHC: 33.1 g/dL (ref 31.5–35.7)
MCV: 91 fL (ref 79–97)
Monocytes Absolute: 0.6 10*3/uL (ref 0.1–0.9)
Monocytes: 6 %
Neutrophils Absolute: 3.6 10*3/uL (ref 1.4–7.0)
Neutrophils: 41 %
Platelets: 297 10*3/uL (ref 150–450)
RBC: 4.5 x10E6/uL (ref 3.77–5.28)
RDW: 13.8 % (ref 11.7–15.4)
WBC: 8.8 10*3/uL (ref 3.4–10.8)

## 2020-02-18 LAB — COMPREHENSIVE METABOLIC PANEL
ALT: 16 IU/L (ref 0–32)
AST: 14 IU/L (ref 0–40)
Albumin/Globulin Ratio: 1.5 (ref 1.2–2.2)
Albumin: 4.4 g/dL (ref 3.8–4.8)
Alkaline Phosphatase: 97 IU/L (ref 39–117)
BUN/Creatinine Ratio: 13 (ref 9–23)
BUN: 11 mg/dL (ref 6–24)
Bilirubin Total: 0.2 mg/dL (ref 0.0–1.2)
CO2: 25 mmol/L (ref 20–29)
Calcium: 9.1 mg/dL (ref 8.7–10.2)
Chloride: 104 mmol/L (ref 96–106)
Creatinine, Ser: 0.83 mg/dL (ref 0.57–1.00)
GFR calc Af Amer: 95 mL/min/{1.73_m2} (ref >59)
GFR calc non Af Amer: 82 mL/min/{1.73_m2} (ref >59)
Globulin, Total: 2.9 g/dL (ref 1.5–4.5)
Glucose: 93 mg/dL (ref 65–99)
Potassium: 3.6 mmol/L (ref 3.5–5.2)
Sodium: 143 mmol/L (ref 134–144)
Total Protein: 7.3 g/dL (ref 6.0–8.5)

## 2020-02-18 LAB — TSH: TSH: 2.89 u[IU]/mL (ref 0.450–4.500)

## 2020-02-18 LAB — VITAMIN D 25 HYDROXY (VIT D DEFICIENCY, FRACTURES): Vit D, 25-Hydroxy: 21.4 ng/mL — ABNORMAL LOW (ref 30.0–100.0)

## 2020-02-18 LAB — HEMOGLOBIN A1C
Est. average glucose Bld gHb Est-mCnc: 123 mg/dL
Hgb A1c MFr Bld: 5.9 % — ABNORMAL HIGH (ref 4.8–5.6)

## 2020-02-18 LAB — VITAMIN B12: Vitamin B-12: 562 pg/mL (ref 232–1245)

## 2020-02-20 ENCOUNTER — Other Ambulatory Visit: Payer: Self-pay | Admitting: Medical

## 2020-02-20 MED ORDER — VITAMIN D 25 MCG (1000 UNIT) PO TABS: 1000.0000 [IU] | ORAL_TABLET | Freq: Every day | ORAL | 3 refills | Status: DC

## 2020-03-23 ENCOUNTER — Encounter: Payer: Self-pay | Admitting: Podiatry

## 2020-03-23 ENCOUNTER — Ambulatory Visit (INDEPENDENT_AMBULATORY_CARE_PROVIDER_SITE_OTHER): Payer: 59 | Admitting: Podiatry

## 2020-03-23 ENCOUNTER — Ambulatory Visit (INDEPENDENT_AMBULATORY_CARE_PROVIDER_SITE_OTHER): Payer: 59

## 2020-03-23 ENCOUNTER — Other Ambulatory Visit: Payer: Self-pay

## 2020-03-23 DIAGNOSIS — M722 Plantar fascial fibromatosis: Secondary | ICD-10-CM

## 2020-03-23 DIAGNOSIS — M958 Other specified acquired deformities of musculoskeletal system: Secondary | ICD-10-CM

## 2020-03-23 DIAGNOSIS — M19072 Primary osteoarthritis, left ankle and foot: Secondary | ICD-10-CM

## 2020-03-23 DIAGNOSIS — G5751 Tarsal tunnel syndrome, right lower limb: Secondary | ICD-10-CM

## 2020-03-23 NOTE — Progress Notes (Signed)
Subjective:  Patient ID: Tara Rodriguez, female    DOB: 1971-09-04,  MRN: [id number]  Chief Complaint  Patient presents with  . Foot Pain    pt is here for here for bil foot pain, pain is elevated to the touch, pt also states that the injection she recieved lasted for a day.    49 y.o. female presents with the above complaint.  Patient is here for follow-up of left ankle pain.  Patient said the pain never left.  She has not got injection by me to the left ankle joint.  The she states the pain got a little bit worse.  Pain is 10 out of 10.  She has not been able to walk properly there is some limping associated with it.  Patient states the injection only helped for a little bit.  She has brought the shoes with her for me to evaluate.  She got new balance sneakers which I agreed were the best for her.  She would like to know if there is anything else that could be done for this left ankle pain.  She denies any other acute complaints.  Review of Systems: Negative except as noted in the HPI. Denies N/V/F/Ch.  Past Medical History:  Diagnosis Date  . Acrochordon    skin, left lower breast  . Allergy   . Anemia    history of  . Blood transfusion without reported diagnosis    during hysterectomy  . Family history of breast cancer   . Family history of colon cancer   . Migraine   . S/P hysterectomy    hx/o fibroids  . Wears glasses     Current Outpatient Medications:  .  acetaminophen (TYLENOL) 500 MG tablet, Take 500 mg by mouth every 6 (six) hours as needed for mild pain. , Disp: , Rfl:  .  cholecalciferol (VITAMIN D3) 25 MCG (1000 UNIT) tablet, Take 1 tablet (1,000 Units total) by mouth daily., Disp: 90 tablet, Rfl: 3 .  gabapentin (NEURONTIN) 300 MG capsule, 300 mg 3 (three) times daily. , Disp: , Rfl:   Social History   Tobacco Use  Smoking Status Never Smoker  Smokeless Tobacco Never Used    No Known Allergies Objective:   There were no vitals filed for this visit. There is  no height or weight on file to calculate BMI. Constitutional Well developed. Well nourished.  Vascular Dorsalis pedis pulses palpable bilaterally. Posterior tibial pulses palpable bilaterally. Capillary refill normal to all digits.  No cyanosis or clubbing noted. Pedal hair growth normal.  Neurologic Normal speech. Oriented to person, place, and time. Positive Tinel's sign at the tarsal tunnel region.  Positive Tinel's sign at common peroneal region.  The toenail side on the dorsal aspect of the foot.  These findings are both equal to bilateral feet.  There is decrease in protective sensation.  Symes monofilament testing positive.  Dermatologic Nails well groomed and normal in appearance. No open wounds. No skin lesions.  Orthopedic:  No pain on palpation to the medial calcaneal tuber.  No pain with dorsiflexion of all the toes.  No pain on lateral squeeze of the calcaneus.  No pain at the posterior tibial tendinitis.  Pain on palpation to the anterolateral gutter of the left ankle joint.  Pain with dorsiflexion of the ankle joint mildly on plantarflexion inversion of the ankle joint.  Concern for possible OCD lesion.   Radiographs: 3 views of skeletally mature adult left ankle uneven joint space narrowing noted  at the ankle joint.  Mild arthritic changes noted.  Plantar heel spurring noted.  Mild arthritic changes of the midfoot noted.  No other abnormalities noted.  Assessment:   1. Tarsal tunnel syndrome of right side   2. Plantar fasciitis of left foot   3. Osteochondral defect of ankle    Plan:  Patient was evaluated and treated and all questions answered.   Left ankle arthritis versus OCD lesion -I examined the patient clinically and the radiographic findings were discussed with the patient in extensive detail.  I believe patient may have a possible OCD lesion that may be causing her pain.  Given her physical findings with pain with plantarflexion inversion of the foot I am  inclined to rule in OCD lesion.  I believe she will benefit from a steroid injection as she has not been given one in a long time.  Patient agrees with the plan would like to proceed with a steroid injection. -A steroid injection was performed at left lateral gutter ankle joint using 1% plain Lidocaine and 10 mg of Kenalog. This was well tolerated. -I also believe she will benefit from cam boot immobilization to help/decrease the inflammation component of pain.  Patient agrees to the plan would like to proceed with a cam boot immobilization. -Cam boot was dispensed  Bilateral tarsal tunnel syndrome~resolving -Patient states that she still has some pain after the injection wore off.  However given that this is coming from proximal region likely the back.  Patient states that she had seen neurologist and neurologist is working her up to evaluate this pain.  I will defer further management of tarsal tunnel syndrome and lower extremity nerve related pain to neurology.  Left plantar fasciitis~resolved -I explained to the patient the etiology of plantar fasciitis and various treatment options associated with it.  I believe patient will benefit from steroid injection to the left plantar heel to help decrease the acute inflammation.  I explained to her that she will need to do the stretching exercises.  I also dispensed her an plantar fascial brace to help support the arch of her foot as well as take the pressure off the plantar fascia.    No follow-ups on file.

## 2020-04-03 ENCOUNTER — Other Ambulatory Visit: Payer: Self-pay | Admitting: Hematology and Oncology

## 2020-04-03 MED FILL — GABAPENTIN 300 MG CAPSULE: 300 | 30 days supply | Qty: 90 | Fill #0

## 2020-04-26 ENCOUNTER — Telehealth: Payer: Self-pay

## 2020-04-26 NOTE — Telephone Encounter (Signed)
Called pt to find out if there was anything we could do to help her with shouldering with GI . They have called pt three times . [place]

## 2020-04-27 ENCOUNTER — Other Ambulatory Visit: Payer: Self-pay

## 2020-04-27 ENCOUNTER — Ambulatory Visit (INDEPENDENT_AMBULATORY_CARE_PROVIDER_SITE_OTHER): Payer: 59 | Admitting: Podiatry

## 2020-04-27 DIAGNOSIS — M958 Other specified acquired deformities of musculoskeletal system: Secondary | ICD-10-CM

## 2020-04-27 DIAGNOSIS — M19072 Primary osteoarthritis, left ankle and foot: Secondary | ICD-10-CM | POA: Diagnosis not present

## 2020-05-03 ENCOUNTER — Encounter: Payer: Self-pay | Admitting: Podiatry

## 2020-05-03 NOTE — Progress Notes (Signed)
Subjective:  Patient ID: Tara Rodriguez, female    DOB: Oct 14, 1971,  MRN: [id number]  Chief Complaint  Patient presents with  . Foot Pain    pt is here for bil foot pain, pt also states that both of the foot and ankle pain, pt states that she is doing a lot better with little to no pain    49 y.o. female presents with the above complaint.  Patient presents here for follow-up of left ankle pain.  Patient states injection helped a lot.  She does not have any pain.  Her pain scale 0 out of 10.  The boot has also been helping.  She denies any other acute complaints.  Review of Systems: Negative except as noted in the HPI. Denies N/V/F/Ch.  Past Medical History:  Diagnosis Date  . Acrochordon    skin, left lower breast  . Allergy   . Anemia    history of  . Blood transfusion without reported diagnosis    during hysterectomy  . Family history of breast cancer   . Family history of colon cancer   . Migraine   . S/P hysterectomy    hx/o fibroids  . Wears glasses     Current Outpatient Medications:  .  acetaminophen (TYLENOL) 500 MG tablet, Take 500 mg by mouth every 6 (six) hours as needed for mild pain. , Disp: , Rfl:  .  cholecalciferol (VITAMIN D3) 25 MCG (1000 UNIT) tablet, Take 1 tablet (1,000 Units total) by mouth daily., Disp: 90 tablet, Rfl: 3 .  gabapentin (NEURONTIN) 300 MG capsule, TAKE 1 CAPSULE BY MOUTH 3 TIMES DAILY, Disp: 90 capsule, Rfl: 2  Social History   Tobacco Use  Smoking Status Never Smoker  Smokeless Tobacco Never Used    No Known Allergies Objective:   There were no vitals filed for this visit. There is no height or weight on file to calculate BMI. Constitutional Well developed. Well nourished.  Vascular Dorsalis pedis pulses palpable bilaterally. Posterior tibial pulses palpable bilaterally. Capillary refill normal to all digits.  No cyanosis or clubbing noted. Pedal hair growth normal.  Neurologic Normal speech. Oriented to person, place, and  time. Positive Tinel's sign at the tarsal tunnel region.  Positive Tinel's sign at common peroneal region.  The toenail side on the dorsal aspect of the foot.  These findings are both equal to bilateral feet.  There is decrease in protective sensation.  Symes monofilament testing positive.  Dermatologic Nails well groomed and normal in appearance. No open wounds. No skin lesions.  Orthopedic:  No pain on palpation to the medial calcaneal tuber.  No pain with dorsiflexion of all the toes.  No pain on lateral squeeze of the calcaneus.  No pain at the posterior tibial tendinitis.  No pain on palpation to the anterolateral gutter of the left ankle joint.  No pain with dorsiflexion of the ankle joint mildly on plantarflexion inversion of the ankle joint.    Radiographs: 3 views of skeletally mature adult left ankle uneven joint space narrowing noted at the ankle joint.  Mild arthritic changes noted.  Plantar heel spurring noted.  Mild arthritic changes of the midfoot noted.  No other abnormalities noted.  Assessment:   1. Osteochondral defect of ankle   2. Arthritis of ankle, left    Plan:  Patient was evaluated and treated and all questions answered.   Left ankle arthritis versus OCD lesion -Patient pain has completely resolved with 1 steroid injection.  She has been  able to ambulate without any clinical pain.  The cam boot has also progressively helped.  She has now transition into regular sneakers. -I explained to her that if she has any pain in the future to come back and see me right away.  Patient states understanding  Bilateral tarsal tunnel syndrome~resolved -Patient states that she still has some pain after the injection wore off.  However given that this is coming from proximal region likely the back.  Patient states that she had seen neurologist and neurologist is working her up to evaluate this pain.  I will defer further management of tarsal tunnel syndrome and lower extremity nerve  related pain to neurology.  Left plantar fasciitis~resolved -I explained to the patient the etiology of plantar fasciitis and various treatment options associated with it.  I believe patient will benefit from steroid injection to the left plantar heel to help decrease the acute inflammation.  I explained to her that she will need to do the stretching exercises.  I also dispensed her an plantar fascial brace to help support the arch of her foot as well as take the pressure off the plantar fascia.    No follow-ups on file.

## 2020-07-13 MED FILL — GABAPENTIN 300 MG CAPSULE: 300 | 30 days supply | Qty: 90 | Fill #1

## 2020-10-16 ENCOUNTER — Other Ambulatory Visit: Payer: Self-pay

## 2020-10-16 ENCOUNTER — Ambulatory Visit (INDEPENDENT_AMBULATORY_CARE_PROVIDER_SITE_OTHER): Payer: 59 | Admitting: Family Medicine

## 2020-10-16 ENCOUNTER — Encounter: Payer: Self-pay | Admitting: Family Medicine

## 2020-10-16 VITALS — BP 120/80 | HR 73 | Temp 98.3°F | Wt 230.4 lb

## 2020-10-16 DIAGNOSIS — N760 Acute vaginitis: Secondary | ICD-10-CM | POA: Diagnosis not present

## 2020-10-16 DIAGNOSIS — N898 Other specified noninflammatory disorders of vagina: Secondary | ICD-10-CM | POA: Diagnosis not present

## 2020-10-16 DIAGNOSIS — B9689 Other specified bacterial agents as the cause of diseases classified elsewhere: Secondary | ICD-10-CM

## 2020-10-16 LAB — POCT WET PREP (WET MOUNT)
Clue Cells Wet Prep Whiff POC: POSITIVE
Trichomonas Wet Prep HPF POC: ABSENT

## 2020-10-16 MED ORDER — FLUCONAZOLE 150 MG PO TABS: 150.0000 mg | ORAL_TABLET | Freq: Once | ORAL | 0 refills | Status: AC

## 2020-10-16 MED ORDER — METRONIDAZOLE 500 MG PO TABS: 500.0000 mg | ORAL_TABLET | Freq: Two times a day (BID) | ORAL | 0 refills | Status: DC

## 2020-10-16 NOTE — Patient Instructions (Signed)
Avoid alcohol with the antibiotic.    Bacterial Vaginosis  Bacterial vaginosis is an infection of the vagina. It happens when too many normal germs (healthy bacteria) grow in the vagina. This infection puts you at risk for infections from sex (STIs). Treating this infection can lower your risk for some STIs. You should also treat this if you are pregnant. It can cause your baby to be born early. Follow these instructions at home: Medicines  Take over-the-counter and prescription medicines only as told by your doctor.  Take or use your antibiotic medicine as told by your doctor. Do not stop taking or using it even if you start to feel better. General instructions  If you your sexual partner is a woman, tell her that you have this infection. She needs to get treatment if she has symptoms. If you have a female partner, he does not need to be treated.  During treatment: ? Avoid sex. ? Do not douche. ? Avoid alcohol as told. ? Avoid breastfeeding as told.  Drink enough fluid to keep your pee (urine) clear or pale yellow.  Keep your vagina and butt (rectum) clean. ? Wash the area with warm water every day. ? Wipe from front to back after you use the toilet.  Keep all follow-up visits as told by your doctor. This is important. Preventing this condition  Do not douche.  Use only warm water to wash around your vagina.  Use protection when you have sex. This includes: ? Latex condoms. ? Dental dams.  Limit how many people you have sex with. It is best to only have sex with the same person (be monogamous).  Get tested for STIs. Have your partner get tested.  Wear underwear that is cotton or lined with cotton.  Avoid tight pants and pantyhose. This is most important in summer.  Do not use any products that have nicotine or tobacco in them. These include cigarettes and e-cigarettes. If you need help quitting, ask your doctor.  Do not use illegal drugs.  Limit how much alcohol you  drink. Contact a doctor if:  Your symptoms do not get better, even after you are treated.  You have more discharge or pain when you pee (urinate).  You have a fever.  You have pain in your belly (abdomen).  You have pain with sex.  Your bleed from your vagina between periods. Summary  This infection happens when too many germs (bacteria) grow in the vagina.  Treating this condition can lower your risk for some infections from sex (STIs).  You should also treat this if you are pregnant. It can cause early (premature) birth.  Do not stop taking or using your antibiotic medicine even if you start to feel better. This information is not intended to replace advice given to you by your health care provider. Make sure you discuss any questions you have with your health care provider. Document Revised: 10/30/2017 Document Reviewed: 08/02/2016 Elsevier Patient Education  2020 [employer].

## 2020-10-16 NOTE — Progress Notes (Signed)
   Subjective:    Patient ID: Tara Rodriguez, female    DOB: 1971/09/03, 49 y.o.   MRN: [id number]  HPI Chief Complaint  Patient presents with  . discharge    discharge and odor for a month, getting worse.    Complains of vaginal discharge and odor for at least one month. States the discharge is thin and changes color sometimes. Brown discharge at this time. No pain or itching.   States she wears a panty liner and has a "stain" on the front of the pad.   Denies sexual in 3 years.  Had a total hysterectomy years ago due to fibroids and heavy bleeding.   Last pelvic exam in the past few months.   Denies fever, chills,  abdominal pain, N/V/D, urinary symptoms.   Reviewed allergies, medications, past medical, surgical, family, and social history.    Review of Systems  Pertinent positives and negatives in the history of present illness.     Objective:   Physical Exam BP 120/80   Pulse 73   Temp 98.3 F (36.8 C)   Wt 230 lb 6.4 oz (104.5 kg)   BMI 33.06 kg/m   Alert and oriented in no acute distress.  Thin, adherent and clear discharge in the vaginal vault.  Otherwise normal exam      Assessment & Plan:  BV (bacterial vaginosis) - Plan: metroNIDAZOLE (FLAGYL) 500 MG tablet  Vaginal discharge - Plan: POCT Wet Prep [facility])  Vaginal odor - Plan: POCT Wet Prep ([place] [place])  Wet prep positive for clue cells and also a few yeast.  Negative trichomoniasis. Discussed diagnosis BV and treatment.  Metronidazole prescribed and advised to avoid alcohol she will also take the Diflucan if not back to baseline.

## 2021-02-13 ENCOUNTER — Encounter: Payer: Self-pay | Admitting: Medical

## 2021-02-13 ENCOUNTER — Ambulatory Visit (INDEPENDENT_AMBULATORY_CARE_PROVIDER_SITE_OTHER): Payer: 59 | Admitting: Medical

## 2021-02-13 ENCOUNTER — Other Ambulatory Visit: Payer: Self-pay

## 2021-02-13 VITALS — BP 136/96 | HR 75 | Ht 70.0 in | Wt 225.0 lb

## 2021-02-13 DIAGNOSIS — L989 Disorder of the skin and subcutaneous tissue, unspecified: Secondary | ICD-10-CM | POA: Diagnosis not present

## 2021-02-13 DIAGNOSIS — C4491 Basal cell carcinoma of skin, unspecified: Secondary | ICD-10-CM

## 2021-02-13 DIAGNOSIS — L918 Other hypertrophic disorders of the skin: Secondary | ICD-10-CM | POA: Diagnosis not present

## 2021-02-13 DIAGNOSIS — G479 Sleep disorder, unspecified: Secondary | ICD-10-CM
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
## 2021-02-13 NOTE — Progress Notes (Signed)
Done

## 2021-03-01 DIAGNOSIS — Z9289 Personal history of other medical treatment: Secondary | ICD-10-CM

## 2021-03-01 HISTORY — DX: Personal history of other medical treatment: Z92.89

## 2021-03-20 ENCOUNTER — Encounter: Payer: Self-pay | Admitting: Neurology

## 2021-03-20 ENCOUNTER — Ambulatory Visit (INDEPENDENT_AMBULATORY_CARE_PROVIDER_SITE_OTHER): Payer: 59 | Admitting: Neurology

## 2021-03-20 VITALS — BP 137/92 | HR 74 | Ht 69.0 in | Wt 219.3 lb

## 2021-03-20 DIAGNOSIS — Z9189 Other specified personal risk factors, not elsewhere classified: Secondary | ICD-10-CM | POA: Diagnosis not present

## 2021-03-20 DIAGNOSIS — G4719 Other hypersomnia: Secondary | ICD-10-CM | POA: Diagnosis not present

## 2021-03-20 DIAGNOSIS — R400 Somnolence: Secondary | ICD-10-CM | POA: Diagnosis not present

## 2021-03-20 DIAGNOSIS — G4726 Circadian rhythm sleep disorder, shift work type: Secondary | ICD-10-CM

## 2021-03-20 DIAGNOSIS — E669 Obesity, unspecified: Secondary | ICD-10-CM

## 2021-03-20 NOTE — Patient Instructions (Signed)
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
## 2021-04-03 ENCOUNTER — Ambulatory Visit (INDEPENDENT_AMBULATORY_CARE_PROVIDER_SITE_OTHER): Payer: 59 | Admitting: Neurology

## 2021-04-03 DIAGNOSIS — G4719 Other hypersomnia: Secondary | ICD-10-CM

## 2021-04-03 DIAGNOSIS — Z9189 Other specified personal risk factors, not elsewhere classified: Secondary | ICD-10-CM

## 2021-04-03 DIAGNOSIS — E669 Obesity, unspecified: Secondary | ICD-10-CM

## 2021-04-03 DIAGNOSIS — G4733 Obstructive sleep apnea (adult) (pediatric): Secondary | ICD-10-CM | POA: Diagnosis not present

## 2021-04-03 DIAGNOSIS — G4726 Circadian rhythm sleep disorder, shift work type: Secondary | ICD-10-CM

## 2021-04-03 DIAGNOSIS — R400 Somnolence: Secondary | ICD-10-CM

## 2021-04-04 NOTE — Progress Notes (Signed)
See procedure note.
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
## 2021-04-08 NOTE — Addendum Note (Signed)
Addended by: Star Age on: 04/08/2021 12:45 PM   Modules accepted: Orders

## 2021-04-08 NOTE — Progress Notes (Signed)
Patient referred by Chana Bode, PA, seen by me on 03/20/21, HST on 04/03/21.    Please call and notify the patient that the recent home sleep test showed obstructive sleep apnea. OSA is overall mild, but worth treating to see if she feels better after treatment. To that end I recommend treatment for this in the form of autoPAP, which means, that we don't have to bring her in for a sleep study with CPAP, but will let her try an autoPAP machine at home, through a DME company (of her choice, or as per insurance requirement). The DME representative will educate her on how to use the machine, how to put the mask on, etc. I have placed an order in the chart. Please send referral, talk to patient, send report to referring MD. We will need a FU in sleep clinic for 10 weeks post-PAP set up, please arrange that with me or one of our NPs. Thanks,   Star Age, MD, PhD [facility])

## 2021-04-09 ENCOUNTER — Telehealth: Payer: Self-pay

## 2021-04-09 NOTE — Telephone Encounter (Signed)
I called pt. No answer, left a message asking pt to call me back.   

## 2021-04-09 NOTE — Telephone Encounter (Signed)
-----   Message from Star Age, MD sent at 04/08/2021 12:45 PM EDT ----- Patient referred by Chana Bode, PA, seen by me on 03/20/21, HST on 04/03/21.    Please call and notify the patient that the recent home sleep test showed obstructive sleep apnea. OSA is overall mild, but worth treating to see if she feels better after treatment. To that end I recommend treatment for this in the form of autoPAP, which means, that we don't have to bring her in for a sleep study with CPAP, but will let her try an autoPAP machine at home, through a DME company (of her choice, or as per insurance requirement). The DME representative will educate her on how to use the machine, how to put the mask on, etc. I have placed an order in the chart. Please send referral, talk to patient, send report to referring MD. We will need a FU in sleep clinic for 10 weeks post-PAP set up, please arrange that with me or one of our NPs. Thanks,   Star Age, MD, PhD [facility])

## 2021-04-10 NOTE — Telephone Encounter (Signed)
Pt has called Megan, RN back please call. 

## 2021-04-10 NOTE — Telephone Encounter (Signed)
I called the pt and we discussed results of sleep study. Pt sts due to the mild osa noted she would like to pursue weight loss efforts at this time and forgo autopap treatment.  Pt sts she will call us back and let us know if she changes her mind.

## 2021-04-10 NOTE — Telephone Encounter (Signed)
Noted, thank you

## 2021-05-23 ENCOUNTER — Ambulatory Visit (INDEPENDENT_AMBULATORY_CARE_PROVIDER_SITE_OTHER): Payer: 59 | Admitting: Medical

## 2021-05-23 ENCOUNTER — Other Ambulatory Visit: Payer: Self-pay

## 2021-05-23 ENCOUNTER — Encounter: Payer: Self-pay | Admitting: Medical

## 2021-05-23 VITALS — BP 112/70 | HR 69 | Ht 69.0 in | Wt 224.8 lb

## 2021-05-23 DIAGNOSIS — Z0000 Encounter for general adult medical examination without abnormal findings: Secondary | ICD-10-CM

## 2021-05-23 DIAGNOSIS — Z1231 Encounter for screening mammogram for malignant neoplasm of breast: Secondary | ICD-10-CM

## 2021-05-23 DIAGNOSIS — G4700 Insomnia, unspecified: Secondary | ICD-10-CM

## 2021-05-23 DIAGNOSIS — Z803 Family history of malignant neoplasm of breast: Secondary | ICD-10-CM | POA: Diagnosis not present

## 2021-05-23 DIAGNOSIS — Z1211 Encounter for screening for malignant neoplasm of colon: Secondary | ICD-10-CM

## 2021-05-23 DIAGNOSIS — R7301 Impaired fasting glucose: Secondary | ICD-10-CM | POA: Diagnosis not present

## 2021-05-23 DIAGNOSIS — Z800 Family history of malignant neoplasm of digestive organs: Secondary | ICD-10-CM | POA: Diagnosis not present

## 2021-05-23 DIAGNOSIS — N951 Menopausal and female climacteric states: Secondary | ICD-10-CM

## 2021-05-23 DIAGNOSIS — Z7185 Encounter for immunization safety counseling: Secondary | ICD-10-CM

## 2021-05-23 DIAGNOSIS — Z13220 Encounter for screening for lipoid disorders: Secondary | ICD-10-CM

## 2021-05-23 DIAGNOSIS — E559 Vitamin D deficiency, unspecified: Secondary | ICD-10-CM

## 2021-05-23 DIAGNOSIS — Z853 Personal history of malignant neoplasm of breast: Secondary | ICD-10-CM

## 2021-05-23 NOTE — Progress Notes (Signed)
Subjective:   HPI  Tara Rodriguez is a 50 y.o. female who presents for Chief Complaint  Patient presents with   Annual Exam    Physical with fastin labs     Patient Care Team: Nobel Brar, Camelia Eng, PA-C as PCP - General (Family Medicine) Excell Seltzer, MD (Inactive) as Consulting Physician (General Surgery) Nicholas Lose, MD as Consulting Physician (Hematology and Oncology) Gery Pray, MD as Consulting Physician (Radiation Oncology) Sees dentist Sees eye doctor Dr. Boneta Lucks, podiatry   Concerns: Gets a little constipation.  Use stool softener no known  She had a rough year back in 2020 with everything going on with having new diagnosis of breast cancer.  She was overwhelmed so she did not go for colonoscopy or repeat mammogram last year.  Gynecological history:  4 pregnancies, 4 live births, s/p hysterectomy  No other new complaint  Reviewed their medical, surgical, family, social, medication, and allergy history and updated chart as appropriate.   Past Medical History:  Diagnosis Date   Acrochordon    skin, left lower breast   Allergy    Anemia    history of   Blood transfusion without reported diagnosis    during hysterectomy   Family history of breast cancer    Family history of colon cancer    H/O echocardiogram 2019   EF 53-97%, grade 2 diastolic dysfunction   H/O sleep study 03/2021   Dr. Rexene Alberts   Migraine    S/P hysterectomy    hx/o fibroids   Vitamin D deficiency    Wears glasses     Family History  Problem Relation Age of Onset   Heart disease Mother        pacemaker   Diabetes Mother    Arthritis Mother    Alcohol abuse Father    Breast cancer Sister        51s   Breast cancer Maternal Grandmother        dx 22, dx 41s   Stroke Maternal Grandfather    Colon cancer Paternal Grandmother    Uterine cancer Paternal Grandmother    Cancer Paternal Grandmother        unknown 3rd cancer   Hypertension Neg Hx    Hyperlipidemia Neg Hx       Current Outpatient Medications:    docusate sodium (COLACE) 100 MG capsule, Take 100 mg by mouth 2 (two) times daily., Disp: , Rfl:    Multiple Vitamin (MULTIVITAMIN) capsule, Take 1 capsule by mouth daily., Disp: , Rfl:   No Known Allergies    Review of Systems Constitutional: -fever, -chills, -sweats, -unexpected weight change, -decreased appetite, -fatigue Allergy: -sneezing, -itching, -congestion Dermatology: -changing moles, --rash, -lumps ENT: -runny nose, -ear pain, -sore throat, -hoarseness, -sinus pain, -teeth pain, - ringing in ears, -hearing loss, -nosebleeds Cardiology: -chest pain, -palpitations, -swelling, -difficulty breathing when lying flat, -waking up short of breath Respiratory: -cough, -shortness of breath, -difficulty breathing with exercise or exertion, -wheezing, -coughing up blood Gastroenterology: -abdominal pain, -nausea, -vomiting, -diarrhea, +constipation, -blood in stool, -changes in bowel movement, -difficulty swallowing or eating Hematology: -bleeding, -bruising  Musculoskeletal: -joint aches, -muscle aches, -joint swelling, -back pain, -neck pain, -cramping, -changes in gait Ophthalmology: denies vision changes, eye redness, itching, discharge Urology: -burning with urination, -difficulty urinating, -blood in urine, -urinary frequency, -urgency, -incontinence Neurology: -headache, -weakness, -tingling, -numbness, -memory loss, -falls, -dizziness Psychology: -depressed mood, -agitation, -sleep problems Breast/gyn: -breast tendnerss, -discharge, -lumps, -vaginal discharge,- irregular periods, -heavy periods   Depression screen  [facility] 2/9 05/23/2021 02/17/2020 06/21/2018 04/14/2018 10/15/2015  Decreased Interest 0 0 0 1 0  Down, Depressed, Hopeless 0 0 1 3 0  PHQ - 2 Score 0 0 1 4 0  Altered sleeping - - - 1 -  Tired, decreased energy - - - 1 -  Change in appetite - - - 1 -  Feeling bad or failure about yourself  - - - 1 -  Trouble concentrating - - - 1  -  Moving slowly or fidgety/restless - - - 1 -  Suicidal thoughts - - - 1 -  PHQ-9 Score - - - 11 -  Difficult doing work/chores - - - Somewhat difficult -       Objective:  BP 112/70   Pulse 69   Ht 5\' 9"  (1.753 m)   Wt 224 lb 12.8 oz (102 kg)   SpO2 99%   BMI 33.20 kg/m   Wt Readings from Last 3 Encounters:  05/23/21 224 lb 12.8 oz (102 kg)  03/20/21 219 lb 5 oz (99.5 kg)  02/13/21 225 lb (102.1 kg)    General appearance: alert, no distress, WD/WN, African American female Skin: linear scar right forearm, surgical scar left upper breast, no other worrisome lesions HEENT: normocephalic, conjunctiva/corneas normal, sclerae anicteric, PERRLA, EOMi, nares patent, no discharge or erythema, pharynx normal Oral cavity: MMM, tongue normal, teeth normal Neck: supple, no lymphadenopathy, no thyromegaly, no masses, normal ROM, no bruits Chest: non tender, normal shape and expansion Heart: RRR, normal S1, S2, no murmurs Lungs: CTA bilaterally, no wheezes, rhonchi, or rales Abdomen: +bs, soft, non tender, non distended, no masses, no hepatomegaly, no splenomegaly, no bruits Back: non tender, normal ROM, no scoliosis Musculoskeletal: upper extremities non tender, no obvious deformity, normal ROM throughout, lower extremities non tender, no obvious deformity, normal ROM throughout Extremities: no edema, no cyanosis, no clubbing Pulses: 2+ symmetric, upper and lower extremities, normal cap refill Neurological: alert, oriented x 3, CN2-12 intact, strength normal upper extremities and lower extremities, sensation normal throughout, DTRs 2+ throughout, no cerebellar signs, gait normal Psychiatric: normal affect, behavior normal, pleasant  Breast: nontender, no masses or lumps, no skin changes, no nipple discharge or inversion, no axillary lymphadenopathy Gyn/rectal - declined   Assessment and Plan :   Encounter Diagnoses  Name Primary?   Encounter for health maintenance examination in  adult Yes   Family history of colon cancer    Family history of breast cancer    Impaired fasting blood sugar    Vaccine counseling    Perimenopausal    Insomnia, unspecified type    History of breast cancer    Vitamin D deficiency    Screening for lipid disorders    Screen for colon cancer    Encounter for screening mammogram for malignant neoplasm of breast      This visit was a preventative care visit, also known as wellness visit or routine physical.   Topics typically include healthy lifestyle, diet, exercise, preventative care, vaccinations, sick and well care, proper use of emergency dept and after hours care, as well as other concerns.     Recommendations: Continue to return yearly for your annual wellness and preventative care visits.  This gives [place] a chance to discuss healthy lifestyle, exercise, vaccinations, review your chart record, and perform screenings where appropriate.  I recommend you see your eye doctor yearly for routine vision care.  I recommend you see your dentist yearly for routine dental care including hygiene visits twice yearly.  Vaccination recommendations were reviewed Immunization History  Administered Date(s) Administered   Influenza,inj,Quad PF,6+ Mos 10/18/2020   Influenza-Unspecified 09/14/2014, 10/02/2015   Moderna Sars-Covid-2 Vaccination 12/12/2019, 01/12/2020, 10/18/2020   Tdap 01/10/2014    I recommend a yearly flu shot in the fall  Shingles vaccine:  I recommend you have a shingles vaccine to help prevent shingles or herpes zoster outbreak.   Please call your insurer to inquire about coverage for the Shingrix vaccine given in 2 doses.   Some insurers cover this vaccine after age 72, some cover this after age 93.  If your insurer covers this, then call to schedule appointment to have this vaccine here.    Screening for cancer: Colon cancer screening: We will refer you for screening colonoscopy  Please call your insurance company to  check coverage for colon cancer screening.  Options may include Cologard stool test or Colonoscopy.  You should also inquire about which facility the colonoscopy could be performed, and coverage for diagnostic vs screening colonoscopy as coverage may vary.  If you have significant family history of colon cancer or blood in the stool, then you should only do the colonoscopy, not the cologard test.   Breast cancer screening: You should perform a self breast exam monthly.   We reviewed recommendations for regular mammograms and breast cancer screening.   Cervical cancer screening: We reviewed recommendations for pap smear screening.   Skin cancer screening: Check your skin regularly for new changes, growing lesions, or other lesions of concern Come in for evaluation if you have skin lesions of concern.  Lung cancer screening: If you have a greater than 20 pack year history of tobacco use, then you may qualify for lung cancer screening with a chest CT scan.   Please call your insurance company to inquire about coverage for this test.  We currently don't have screenings for other cancers besides breast, cervical, colon, and lung cancers.  If you have a strong family history of cancer or have other cancer screening concerns, please let me know.    Bone health: Get at least 150 minutes of aerobic exercise weekly Get weight bearing exercise at least once weekly Bone density test:  A bone density test is an imaging test that uses a type of X-ray to measure the amount of calcium and other minerals in your bones. The test may be used to diagnose or screen you for a condition that causes weak or thin bones (osteoporosis), predict your risk for a broken bone (fracture), or determine how well your osteoporosis treatment is working. The bone density test is recommended for females 65 and older, or females or males <42 if certain risk factors such as thyroid disease, long term use of steroids such as for  asthma or rheumatological issues, vitamin D deficiency, estrogen deficiency, family history of osteoporosis, self or family history of fragility fracture in first degree relative.    Heart health: Get at least 150 minutes of aerobic exercise weekly Limit alcohol It is important to maintain a healthy blood pressure and healthy cholesterol numbers  Heart disease screening: Screening for heart disease includes screening for blood pressure, fasting lipids, glucose/diabetes screening, BMI height to weight ratio, reviewed of smoking status, physical activity, and diet.    Goals include blood pressure 120/80 or less, maintaining a healthy lipid/cholesterol profile, preventing diabetes or keeping diabetes numbers under good control, not smoking or using tobacco products, exercising most days per week or at least 150 minutes per week of exercise, and  eating healthy variety of fruits and vegetables, healthy oils, and avoiding unhealthy food choices like fried food, fast food, high sugar and high cholesterol foods.    Other tests may possibly include EKG test, CT coronary calcium score, echocardiogram, exercise treadmill stress test.     Medical care options: I recommend you continue to seek care here first for routine care.  We try really hard to have available appointments Monday through Friday daytime hours for sick visits, acute visits, and physicals.  Urgent care should be used for after hours and weekends for significant issues that cannot wait till the next day.  The emergency department should be used for significant potentially life-threatening emergencies.  The emergency department is expensive, can often have long wait times for less significant concerns, so try to utilize primary care, urgent care, or telemedicine when possible to avoid unnecessary trips to the emergency department.  Virtual visits and telemedicine have been introduced since the pandemic started in 2020, and can be convenient  ways to receive medical care.  We offer virtual appointments as well to assist you in a variety of options to seek medical care.    Separate significant issues discussed: Impaired fasting glucose - updated labs today for screening  Vit d deficiency - updated labs today. Not currently on Vit D supplement  Insomnia - f/u with neurology  BMI > 30 - work on efforts to lose weight through health diet and exercise    Orlene was seen today for annual exam.  Diagnoses and all orders for this visit:  Encounter for health maintenance examination in adult -     Comprehensive metabolic panel -     CBC with Differential/Platelet -     Lipid panel -     VITAMIN D 25 Hydroxy (Vit-D Deficiency, Fractures) -     Hemoglobin A1c -     Hepatitis C antibody  Family history of colon cancer -     Ambulatory referral to Gastroenterology  Family history of breast cancer -     MM Digital Diagnostic Bilat; Future  Impaired fasting blood sugar -     Hemoglobin A1c  Vaccine counseling  Perimenopausal  Insomnia, unspecified type  History of breast cancer -     MM Digital Diagnostic Bilat; Future  Vitamin D deficiency -     VITAMIN D 25 Hydroxy (Vit-D Deficiency, Fractures)  Screening for lipid disorders -     Lipid panel  Screen for colon cancer -     Ambulatory referral to Gastroenterology  Encounter for screening mammogram for malignant neoplasm of breast -     MM Digital Diagnostic Bilat; Future -     Ambulatory referral to Gastroenterology   Follow-up pending labs, yearly for physical

## 2021-05-23 NOTE — Patient Instructions (Signed)
This visit was a preventative care visit, also known as wellness visit or routine physical.   Topics typically include healthy lifestyle, diet, exercise, preventative care, vaccinations, sick and well care, proper use of emergency dept and after hours care, as well as other concerns.     Recommendations: Continue to return yearly for your annual wellness and preventative care visits.  This gives [place] a chance to discuss healthy lifestyle, exercise, vaccinations, review your chart record, and perform screenings where appropriate.  I recommend you see your eye doctor yearly for routine vision care.  I recommend you see your dentist yearly for routine dental care including hygiene visits twice yearly.   Vaccination recommendations were reviewed Immunization History  Administered Date(s) Administered   Influenza,inj,Quad PF,6+ Mos 10/18/2020   Influenza-Unspecified 09/14/2014, 10/02/2015   Moderna Sars-Covid-2 Vaccination 12/12/2019, 01/12/2020, 10/18/2020   Tdap 01/10/2014    I recommend a yearly flu shot in the fall  Shingles vaccine:  I recommend you have a shingles vaccine to help prevent shingles or herpes zoster outbreak.   Please call your insurer to inquire about coverage for the Shingrix vaccine given in 2 doses.   Some insurers cover this vaccine after age 25, some cover this after age 67.  If your insurer covers this, then call to schedule appointment to have this vaccine here.    Screening for cancer: Colon cancer screening: We will refer you for screening colonoscopy  Please call your insurance company to check coverage for colon cancer screening.  Options may include Cologard stool test or Colonoscopy.  You should also inquire about which facility the colonoscopy could be performed, and coverage for diagnostic vs screening colonoscopy as coverage may vary.  If you have significant family history of colon cancer or blood in the stool, then you should only do the colonoscopy, not  the cologard test.   Breast cancer screening: You should perform a self breast exam monthly.   We reviewed recommendations for regular mammograms and breast cancer screening.   Cervical cancer screening: We reviewed recommendations for pap smear screening.   Skin cancer screening: Check your skin regularly for new changes, growing lesions, or other lesions of concern Come in for evaluation if you have skin lesions of concern.  Lung cancer screening: If you have a greater than 20 pack year history of tobacco use, then you may qualify for lung cancer screening with a chest CT scan.   Please call your insurance company to inquire about coverage for this test.  We currently don't have screenings for other cancers besides breast, cervical, colon, and lung cancers.  If you have a strong family history of cancer or have other cancer screening concerns, please let me know.    Bone health: Get at least 150 minutes of aerobic exercise weekly Get weight bearing exercise at least once weekly Bone density test:  A bone density test is an imaging test that uses a type of X-ray to measure the amount of calcium and other minerals in your bones. The test may be used to diagnose or screen you for a condition that causes weak or thin bones (osteoporosis), predict your risk for a broken bone (fracture), or determine how well your osteoporosis treatment is working. The bone density test is recommended for females 74 and older, or females or males <13 if certain risk factors such as thyroid disease, long term use of steroids such as for asthma or rheumatological issues, vitamin D deficiency, estrogen deficiency, family history of osteoporosis, self  or family history of fragility fracture in first degree relative.    Heart health: Get at least 150 minutes of aerobic exercise weekly Limit alcohol It is important to maintain a healthy blood pressure and healthy cholesterol numbers  Heart disease  screening: Screening for heart disease includes screening for blood pressure, fasting lipids, glucose/diabetes screening, BMI height to weight ratio, reviewed of smoking status, physical activity, and diet.    Goals include blood pressure 120/80 or less, maintaining a healthy lipid/cholesterol profile, preventing diabetes or keeping diabetes numbers under good control, not smoking or using tobacco products, exercising most days per week or at least 150 minutes per week of exercise, and eating healthy variety of fruits and vegetables, healthy oils, and avoiding unhealthy food choices like fried food, fast food, high sugar and high cholesterol foods.    Other tests may possibly include EKG test, CT coronary calcium score, echocardiogram, exercise treadmill stress test.     Medical care options: I recommend you continue to seek care here first for routine care.  We try really hard to have available appointments Monday through Friday daytime hours for sick visits, acute visits, and physicals.  Urgent care should be used for after hours and weekends for significant issues that cannot wait till the next day.  The emergency department should be used for significant potentially life-threatening emergencies.  The emergency department is expensive, can often have long wait times for less significant concerns, so try to utilize primary care, urgent care, or telemedicine when possible to avoid unnecessary trips to the emergency department.  Virtual visits and telemedicine have been introduced since the pandemic started in 2020, and can be convenient ways to receive medical care.  We offer virtual appointments as well to assist you in a variety of options to seek medical care.    Separate significant issues discussed: Impaired fasting glucose - updated labs today for screening  Vit d deficiency - updated labs today. Not currently on Vit D supplement  Insomnia - f/u with neurology  BMI > 30 - work on efforts  to lose weight through health diet and exercise

## 2021-05-24 LAB — COMPREHENSIVE METABOLIC PANEL
ALT: 23 IU/L (ref 0–32)
AST: 17 IU/L (ref 0–40)
Albumin/Globulin Ratio: 1.6 (ref 1.2–2.2)
Albumin: 4.4 g/dL (ref 3.8–4.9)
Alkaline Phosphatase: 113 IU/L (ref 44–121)
BUN/Creatinine Ratio: 11 (ref 9–23)
BUN: 10 mg/dL (ref 6–24)
Bilirubin Total: 0.3 mg/dL (ref 0.0–1.2)
CO2: 22 mmol/L (ref 20–29)
Calcium: 9 mg/dL (ref 8.7–10.2)
Chloride: 104 mmol/L (ref 96–106)
Creatinine, Ser: 0.87 mg/dL (ref 0.57–1.00)
Globulin, Total: 2.7 g/dL (ref 1.5–4.5)
Glucose: 100 mg/dL — ABNORMAL HIGH (ref 65–99)
Potassium: 3.9 mmol/L (ref 3.5–5.2)
Sodium: 142 mmol/L (ref 134–144)
Total Protein: 7.1 g/dL (ref 6.0–8.5)
eGFR: 81 mL/min/{1.73_m2} (ref >59)

## 2021-05-24 LAB — CBC WITH DIFFERENTIAL/PLATELET
Basophils Absolute: 0.1 10*3/uL (ref 0.0–0.2)
Basos: 1 %
EOS (ABSOLUTE): 0.1 10*3/uL (ref 0.0–0.4)
Eos: 2 %
Hematocrit: 41.2 % (ref 34.0–46.6)
Hemoglobin: 13.3 g/dL (ref 11.1–15.9)
Immature Grans (Abs): 0 10*3/uL (ref 0.0–0.1)
Immature Granulocytes: 0 %
Lymphocytes Absolute: 3.6 10*3/uL — ABNORMAL HIGH (ref 0.7–3.1)
Lymphs: 55 %
MCH: 29.1 pg (ref 26.6–33.0)
MCHC: 32.3 g/dL (ref 31.5–35.7)
MCV: 90 fL (ref 79–97)
Monocytes Absolute: 0.4 10*3/uL (ref 0.1–0.9)
Monocytes: 7 %
Neutrophils Absolute: 2.3 10*3/uL (ref 1.4–7.0)
Neutrophils: 35 %
Platelets: 280 10*3/uL (ref 150–450)
RBC: 4.57 x10E6/uL (ref 3.77–5.28)
RDW: 13.9 % (ref 11.7–15.4)
WBC: 6.5 10*3/uL (ref 3.4–10.8)

## 2021-05-24 LAB — VITAMIN D 25 HYDROXY (VIT D DEFICIENCY, FRACTURES): Vit D, 25-Hydroxy: 32.1 ng/mL (ref 30.0–100.0)

## 2021-05-24 LAB — LIPID PANEL
Chol/HDL Ratio: 3.2 ratio (ref 0.0–4.4)
Cholesterol, Total: 131 mg/dL (ref 100–199)
HDL: 41 mg/dL (ref >39)
LDL Chol Calc (NIH): 72 mg/dL (ref 0–99)
Triglycerides: 98 mg/dL (ref 0–149)
VLDL Cholesterol Cal: 18 mg/dL (ref 5–40)

## 2021-05-24 LAB — HEPATITIS C ANTIBODY: Hep C Virus Ab: <0.1 s/co ratio (ref 0.0–0.9)

## 2021-05-24 LAB — HEMOGLOBIN A1C
Est. average glucose Bld gHb Est-mCnc: 131 mg/dL
Hgb A1c MFr Bld: 6.2 % — ABNORMAL HIGH (ref 4.8–5.6)

## 2021-06-05 ENCOUNTER — Other Ambulatory Visit: Payer: Self-pay | Admitting: Medical

## 2021-06-05 DIAGNOSIS — Z9889 Other specified postprocedural states: Secondary | ICD-10-CM

## 2021-06-19 ENCOUNTER — Other Ambulatory Visit: Payer: Self-pay

## 2021-06-19 ENCOUNTER — Ambulatory Visit
Admission: RE | Admit: 2021-06-19 | Discharge: 2021-06-19 | Disposition: A | Discharge status: Home / Self Care | Payer: 59 | Source: Ambulatory Visit | Attending: Medical | Admitting: Medical

## 2021-06-19 DIAGNOSIS — Z853 Personal history of malignant neoplasm of breast: Secondary | ICD-10-CM

## 2021-06-19 DIAGNOSIS — Z1231 Encounter for screening mammogram for malignant neoplasm of breast: Secondary | ICD-10-CM

## 2021-06-19 DIAGNOSIS — Z803 Family history of malignant neoplasm of breast: Secondary | ICD-10-CM

## 2022-05-26 ENCOUNTER — Encounter: Payer: 59 | Admitting: Medical

## 2022-05-26 ENCOUNTER — Telehealth: Payer: Self-pay | Admitting: Medical

## 2022-05-26 DIAGNOSIS — Z90710 Acquired absence of both cervix and uterus: Secondary | ICD-10-CM | POA: Insufficient documentation

## 2022-05-26 DIAGNOSIS — Z0000 Encounter for general adult medical examination without abnormal findings: Secondary | ICD-10-CM

## 2022-05-26 NOTE — Progress Notes (Deleted)
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

## 2022-05-27 ENCOUNTER — Encounter: Payer: Self-pay | Admitting: Medical

## 2022-05-29 ENCOUNTER — Ambulatory Visit: Payer: 59 | Admitting: Medical

## 2022-05-29 VITALS — BP 110/60 | HR 68 | Temp 98.0°F | Wt 229.6 lb

## 2022-05-29 DIAGNOSIS — T148XXA Other injury of unspecified body region, initial encounter: Secondary | ICD-10-CM

## 2022-05-29 DIAGNOSIS — Z23 Encounter for immunization: Secondary | ICD-10-CM | POA: Diagnosis not present

## 2022-05-29 DIAGNOSIS — W5503XA Scratched by cat, initial encounter: Secondary | ICD-10-CM

## 2022-05-29 MED ORDER — AZITHROMYCIN 250 MG PO TABS: ORAL_TABLET | ORAL | 0 refills | Status: DC

## 2022-05-29 NOTE — Progress Notes (Signed)
Subjective:  Tara Rodriguez is a 51 y.o. female who presents for Chief Complaint  Patient presents with   scratched by cat    Scratched by cat yesterday on finger and thumb of right hand. The scratch on thumb still hurts patient     Tara Rodriguez was feeding an abandoned cat who's owner abandoned him and the cat comes aroudn her house.  She was trying to feed the cat yesterday and the cat scratched her right hand on the thumb and in between index and middle finger.   It drew some blood.  She cleaned the wound, using some soap , water, and alcohol to clean the wound.   Also used some triple antibiotic ointments as well.  No fever, body aches, chills, redness or pus from the wound  No other aggravating or relieving factors.    No other c/o.  The following portions of the patient's history were reviewed and updated as appropriate: allergies, current medications, past family history, past medical history, past social history, past surgical history and problem list.  ROS Otherwise as in subjective above  Objective: BP 110/60   Pulse 68   Temp 98 F (36.7 C)   Wt 229 lb 9.6 oz (104.1 kg)   BMI 33.91 kg/m   General appearance: alert, no distress, well developed, well nourished Very faint marking on her right posterior hand between second and third finger from cat scratch , no other erythema swelling or warmth. Hand neurovascularly intact   Assessment: Encounter Diagnoses  Name Primary?   Cat scratch Yes   Need for Td vaccine      Plan: No obvious infected wound today.  She quickly  cleaned the wound after the cat scratch yesterday.  Advise she continue good hygiene.  Watch for any signs of infection.  We discussed either being more aggressive in taking antibiotic given the scratch or using watch and wait approach.  We discussed the possibility of cat scratch disease and symptoms to watch for.  Begin antibiotic if any signs of infection.    Tetanus booster updated today  Tara Rodriguez was seen  today for scratched by cat.  Diagnoses and all orders for this visit:  Cat scratch  Need for Td vaccine  Other orders -     azithromycin (ZITHROMAX) 250 MG tablet; 2 tablets day 1, then 1 tablet days 2-4   Follow up: soon for fasting physical

## 2022-05-29 NOTE — Addendum Note (Signed)
Addended by: Minette Headland A on: 05/29/2022 10:07 AM   Modules accepted: Orders

## 2022-07-24 ENCOUNTER — Telehealth: Payer: Self-pay | Admitting: Medical

## 2022-07-24 ENCOUNTER — Encounter: Payer: Self-pay | Admitting: Medical

## 2022-07-24 ENCOUNTER — Ambulatory Visit: Payer: 59 | Admitting: Medical

## 2022-07-24 VITALS — BP 124/82 | HR 76 | Temp 98.4°F | Wt 226.0 lb

## 2022-07-24 DIAGNOSIS — R7301 Impaired fasting glucose: Secondary | ICD-10-CM | POA: Diagnosis not present

## 2022-07-24 DIAGNOSIS — Z853 Personal history of malignant neoplasm of breast: Secondary | ICD-10-CM

## 2022-07-24 DIAGNOSIS — E669 Obesity, unspecified: Secondary | ICD-10-CM | POA: Diagnosis not present

## 2022-07-24 LAB — POCT GLYCOSYLATED HEMOGLOBIN (HGB A1C): Hemoglobin A1C: 6.7 % — AB (ref 4.0–5.6)
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
## 2022-07-24 NOTE — Telephone Encounter (Signed)
Her hemoglobin A1c diabetes screen came back at 6.7% today.  This is technically in the diabetic range.  To make a diagnosis of diabetes we have to have a second test which is a fasting glucose over 126.  Please have her come back in soon to check a fasting blood sugar and to discuss this further.  Given that the [facility] sends these forms that we have to fill out means for likely need to be a little more aggressive in controlling her blood sugars and the prevent progression to diabetes if she is still in the prediabetic range.  She may actually still be in the prediabetic range.  I would recommend beginning a medication to help control blood sugars.  We could begin something like metformin once daily to help control sugars if agreeable I will send this to the pharmacy.  This is a once daily tablet.  Most people do okay with at 1 tablet daily but it can cause nausea or loose stools.  Her [place] DMV forms are ready to be copied and mailed in or faxed into the state

## 2022-07-25 NOTE — Telephone Encounter (Signed)
Left message for pt to call me back 

## 2022-08-06 ENCOUNTER — Encounter: Payer: Self-pay | Admitting: Internal Medicine

## 2022-09-09 ENCOUNTER — Encounter: Payer: Self-pay | Admitting: Internal Medicine

## 2023-08-05 ENCOUNTER — Other Ambulatory Visit (INDEPENDENT_AMBULATORY_CARE_PROVIDER_SITE_OTHER): Payer: 59

## 2023-08-05 ENCOUNTER — Telehealth (INDEPENDENT_AMBULATORY_CARE_PROVIDER_SITE_OTHER): Payer: 59 | Admitting: Medical

## 2023-08-05 VITALS — Wt 225.0 lb

## 2023-08-05 DIAGNOSIS — U071 COVID-19: Secondary | ICD-10-CM | POA: Diagnosis not present

## 2023-08-05 DIAGNOSIS — R52 Pain, unspecified: Secondary | ICD-10-CM

## 2023-08-05 DIAGNOSIS — R6889 Other general symptoms and signs: Secondary | ICD-10-CM

## 2023-08-05 LAB — POCT INFLUENZA A/B
Influenza A, POC: NEGATIVE
Influenza B, POC: NEGATIVE

## 2023-08-05 LAB — POC COVID19 BINAXNOW: SARS Coronavirus 2 Ag: POSITIVE — AB

## 2023-08-05 MED ORDER — PAXLOVID (300/100) 20 X 150 MG & 10 X 100MG PO TBPK: 1.0000 | ORAL_TABLET | Freq: Two times a day (BID) | ORAL | 0 refills | Status: AC

## 2023-08-05 NOTE — Progress Notes (Signed)
Subjective:     Patient ID: Tara Rodriguez, female   DOB: Mar 26, 1971, 52 y.o.   MRN: [id number]  This visit type was conducted due to national recommendations for restrictions regarding the COVID-19 Pandemic (e.g. social distancing) in an effort to limit this patient's exposure and mitigate transmission in our community.  Due to their co-morbid illnesses, this patient is at least at moderate risk for complications without adequate follow up.  This format is felt to be most appropriate for this patient at this time.    Documentation for virtual audio and video telecommunications through [facility] encounter:  The patient was located at home. The provider was located in the office. The patient did consent to this visit and is aware of possible charges through their insurance for this visit.  The other persons participating in this telemedicine service were none. Time spent on call was 20 minutes and in review of previous records 20 minutes total.  This virtual service is not related to other E/M service within previous 7 days.   HPI Chief Complaint  Patient presents with   flu like symptoms    Flu like symptoms started yesterday. Symptoms- HA, sore throat, body aches, chills, no covid testing   Virtual consult for not feeling well.  Symptoms started yesterday.  She notes headache, sore throat, body aches, chills, feels hot.  Has subjective fever.  No NVD.   No Sob or wheezing.  No rash.  Has coughed up some mucous.  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   Using Nyquil and dayquil.   Using some tylenol.  Headache is worse symptoms.  No other symptoms.No other aggravating or relieving factors. No other complaint.  Past Medical History:  Diagnosis Date   Acrochordon    skin, left lower breast   Allergy    Anemia    history of   Blood transfusion without reported diagnosis    during hysterectomy   Family history of breast cancer    Family history of colon cancer     H/O echocardiogram 2019   EF 55-60%, grade 2 diastolic dysfunction   H/O sleep study 03/2021   Dr. Frances Furbish   Migraine    S/P hysterectomy    hx/o fibroids   Vitamin D deficiency    Wears glasses    No current outpatient medications on file prior to visit.   No current facility-administered medications on file prior to visit.      Review of Systems As in subjective    Objective:   Physical Exam Due to coronavirus pandemic stay at home measures, patient visit was virtual and they were not examined in person.   Wt 225 lb (102.1 kg)   BMI 33.23 kg/m   Gen: wd, wn, nad Somewhat ill-appearing Congested sounding No labored breathing or wheezing      Assessment:     Encounter Diagnoses  Name Primary?   Body aches Yes   Flu-like symptoms        Plan:     Advised rest, hydration, can continue DayQuil and NyQuil, can alternate Tylenol and ibuprofen for fever not feeling well.  Advised salt water gargles.  She will come up to our office for COVID and flu screening this afternoon here shortly.  We discussed the possibility of early false negative test.  Nevertheless we will start with testing for COVID and flu given the symptoms   She came up to office for testing, + for covid, negative for influenza.  Covid infection, covid illness general recommendations:  I recommend you rest, hydrate well with water and clear fluids throughout the day.  If you feel dry in the mouth, tongue or feel that you are urinating as much as usual, then increase hydration.  You urine should be like yellow to clear, not dark yellow or darker.     Pain, body aches, or fever: You can use Tylenol /Acetaminophen 325mg  over the counter for pain or fever, every 4-6 hours  You can use Ibuprofen 200mg  over the counter, 3 tablets either 2 or 3 times daily for pain or fever.     Cough and congestion: You can continue Dayquil/Nyquil for asymptotes     Nausea: You can use over the counter Emetrol  for nausea.     Antiviral medication: Begin medication Paxlovid to help reduce your risk of hospitalization or severe illness.  If medicaiton is not available, too expensive or not covered by insurance, then call us back.   Other supportive measures: I recommend extra vitamins to help your body fight the illness.     In the next few days, if you are having trouble breathing, if you are very weak, have high fever 103 or higher consistently despite Tylenol, or uncontrollable nausea and vomiting, then call or go to the emergency department.    If you have other questions or have other symptoms or questions you are concerned about then please make a virtual visit   Covid symptoms such as fatigue and cough can linger over 2 weeks, even after the initial fever, aches, chills, and other initial symptoms.   Self Quarantine: The CDC, Centers for Disease Control has recommended a self quarantine of 5 days from the start of your illness until you are symptom-free including at least 24 hours of no symptoms including no fever, no shortness of breath, and no body aches and chills, by day 5 before returning to work or general contact with the public.  What does self quarantine mean: avoiding contact with people as much as possible.   Particularly in your house, isolate your self from others in a separate room, wear a mask when possible in the room, particularly if coughing a lot.   Have others bring food, water, medications, etc., to your door, but avoid direct contact with your household contacts during this time to avoid spreading the infection to them.   If you have a separate bathroom and living quarters during the next 2 weeks away from others, that would be preferable.    If you can't completely isolate, then wear a mask, wash hands frequently with soap and water for at least 15 seconds, minimize close contact with others, and have a friend or family member check regularly from a distance to make sure  you are not getting seriously worse.     You should not be going out in public, should not be going to stores, to work or other public places until all your symptoms have resolved and at least 5 days + 24 hours of no symptoms at all have transpired.   Ideally you should avoid contact with others for a full 5 days if possible.  One of the goals is to limit spread to high risk people; people that are older and elderly, people with multiple health issues like diabetes, heart disease, lung disease, and anybody that has weakened immune systems such as people with cancer or on immunosuppressive therapy.      Oluwakemi was seen today for flu  like symptoms.  Diagnoses and all orders for this visit:  Body aches  Flu-like symptoms  Other orders -     nirmatrelvir & ritonavir (PAXLOVID, 300/100,) 20 x 150 MG & 10 x 100MG  TBPK; Take 1 tablet by mouth in the morning and at bedtime for 5 days.    F/u prn

## 2023-08-10 ENCOUNTER — Encounter: Payer: Self-pay | Admitting: Medical

## 2023-08-10 ENCOUNTER — Telehealth: Payer: Self-pay

## 2023-08-10 NOTE — Telephone Encounter (Signed)
Tara Rodriguez is coming to pick up her letter for being out of work. She needs a negative covid test to return to work. I told her it can be positive for weeks but she wants to know if she can get one done while she is here to pick up the letter.

## 2023-08-11 ENCOUNTER — Other Ambulatory Visit (INDEPENDENT_AMBULATORY_CARE_PROVIDER_SITE_OTHER): Payer: 59

## 2023-08-11 DIAGNOSIS — R52 Pain, unspecified: Secondary | ICD-10-CM | POA: Diagnosis not present

## 2023-08-11 LAB — POC COVID19 BINAXNOW: SARS Coronavirus 2 Ag: NEGATIVE

## 2023-08-11 NOTE — Telephone Encounter (Signed)
Pt is coming in today and is on nurse schedule.

## 2024-01-11 ENCOUNTER — Ambulatory Visit: Payer: 59 | Admitting: Medical

## 2024-01-11 ENCOUNTER — Encounter: Payer: Self-pay | Admitting: Medical

## 2024-01-11 DIAGNOSIS — Z1211 Encounter for screening for malignant neoplasm of colon: Secondary | ICD-10-CM | POA: Diagnosis not present

## 2024-01-11 DIAGNOSIS — L299 Pruritus, unspecified: Secondary | ICD-10-CM | POA: Diagnosis not present

## 2024-01-11 DIAGNOSIS — R7301 Impaired fasting glucose: Secondary | ICD-10-CM

## 2024-01-11 DIAGNOSIS — Z1231 Encounter for screening mammogram for malignant neoplasm of breast: Secondary | ICD-10-CM

## 2024-01-11 DIAGNOSIS — Z853 Personal history of malignant neoplasm of breast: Secondary | ICD-10-CM

## 2024-01-11 DIAGNOSIS — Z780 Asymptomatic menopausal state: Secondary | ICD-10-CM | POA: Diagnosis not present

## 2024-01-11 DIAGNOSIS — M62838 Other muscle spasm: Secondary | ICD-10-CM | POA: Diagnosis not present

## 2024-01-11 DIAGNOSIS — R519 Headache, unspecified: Secondary | ICD-10-CM

## 2024-01-11 NOTE — Progress Notes (Signed)
 Subjective:  Tara Rodriguez is a 53 y.o. female who presents for Chief Complaint  Patient presents with   Itching all over    Itching all over body, doesn't see a rash, hair thinning, random headaches throughout the day, joint pain. not sure what is going on,      Here for several concerns, symptoms.  She has not been in for evaluation in a while, last labs 2022.  After prior breast cancer diagnosis she has been a little scared to do any other screenings.  Is having itching all over body, skin, torso, extremities, scalp.  Been getting some spurts of headaches a few times daily.  Inside of ear itchy.   Hair seems thinning compared to prior.  Sometimes has trouble with getting a word out, brain more foggy of late.    Coworkers asked her about menopause or cortisol levels.     She notes hx/o hysterectomy 15 years ago, complete hysterectomy.   Sometimes gets muscle spasm in upper back.   Doesn't enjoy taste of water, but tries to drink enough daily.  Was taking extra potassium but stopped this, and recently has been using womens one a day vitamins.    No concern for hepatitis exposure, no hx/o liver disease  Last sexual activity 5 years with former partner who passed away.  No other aggravating or relieving factors.    No other c/o.   Past Medical History:  Diagnosis Date   Acrochordon    skin, left lower breast   Allergy    Anemia    history of   Blood transfusion without reported diagnosis    during hysterectomy   Family history of breast cancer    Family history of colon cancer    H/O echocardiogram 2019   EF 55-60%, grade 2 diastolic dysfunction   H/O sleep study 03/2021   Dr. Omar Rodriguez   History of breast cancer 2020   in remission   Migraine    S/P hysterectomy    hx/o fibroids   Vitamin D  deficiency    Wears glasses    No current outpatient medications on file prior to visit.   No current facility-administered medications on file prior to visit.   Past Surgical  History:  Procedure Laterality Date   ABDOMINAL HYSTERECTOMY  2009   total, due to fibroids   BREAST LUMPECTOMY WITH RADIOACTIVE SEED AND SENTINEL LYMPH NODE BIOPSY Right 01/05/2019   Procedure: RIGHT BREAST LUMPECTOMY WITH RADIOACTIVE SEED AND SENTINEL LYMPH NODE BIOPSY;  Surgeon: Tara Lente, MD;  Location: [facility];  Service: General;  Laterality: Right;   COLONOSCOPY  2009   due to anemia   IR REMOVAL TUN ACCESS W/ PORT W/O FL MOD SED  06/27/2019   PORTACATH PLACEMENT N/A 07/05/2018   Procedure: INSERTION PORT-A-CATH;  Surgeon: Tara Lente, MD;  Location: [facility];  Service: General;  Laterality: N/A;     The following portions of the patient's history were reviewed and updated as appropriate: allergies, current medications, past family history, past medical history, past social history, past surgical history and problem list.  ROS Otherwise as in subjective above  Objective: BP 120/82   Pulse 73   Temp 97.8 F (36.6 C)   Wt 226 lb 12.8 oz (102.9 kg)   SpO2 98%   BMI 33.49 kg/m   Wt Readings from Last 3 Encounters:  01/11/24 226 lb 12.8 oz (102.9 kg)  08/05/23 225 lb (102.1 kg)  07/24/22 226 lb (102.5  kg)    General appearance: alert, no distress, well developed, well nourished Skin: No obvious excoriation or rash, no obvious lesion, she has her hair pulled back in a ponytail and no obvious clumps of hair missing, no obvious lice or other HEENT: normocephalic, sclerae anicteric, conjunctiva pink and moist, TMs pearly, nares patent, no discharge or erythema, pharynx normal Oral cavity: MMM, no lesions Neck: supple, no lymphadenopathy, no thyromegaly, no masses, no bruits Heart: RRR, normal S1, S2, no murmurs Lungs: CTA bilaterally, no wheezes, rhonchi, or rales Abdomen: +bs, soft, non tender, non distended, no masses, no hepatomegaly, no splenomegaly Pulses: 2+ radial pulses, 2+ pedal pulses, normal cap refill Ext: no  edema   Assessment: Encounter Diagnoses  Name Primary?   Itching    Muscle spasm    Menopause    Screening for colon cancer    Screening mammogram for breast cancer    Nonintractable headache, unspecified chronicity pattern, unspecified headache type    History of breast cancer    Impaired fasting blood sugar      Plan: Discussed possible causes of itching, differential.  Also discussed prediabetes labs from 2022, prior breast cancer diagnosis and treatment  Update labs given variety of symptoms and normal exam  Referrals for GI for first colonoscopy   I encouraged her to get updated mammogram   Tara Rodriguez was seen today for itching all over.  Diagnoses and all orders for this visit:  Itching -     Comprehensive metabolic panel -     CBC with Differential/Platelet -     TSH -     Sedimentation rate  Muscle spasm -     Comprehensive metabolic panel -     CBC with Differential/Platelet  Menopause -     Comprehensive metabolic panel -     CBC with Differential/Platelet -     TSH  Screening for colon cancer -     Ambulatory referral to Gastroenterology  Screening mammogram for breast cancer -     MM 3D SCREENING MAMMOGRAM BILATERAL BREAST  Nonintractable headache, unspecified chronicity pattern, unspecified headache type -     Comprehensive metabolic panel -     CBC with Differential/Platelet -     TSH -     Sedimentation rate  History of breast cancer  Impaired fasting blood sugar -     Hemoglobin A1c    Follow up: pending labs, mammogram, referral

## 2024-01-11 NOTE — Patient Instructions (Signed)
Please call to schedule your mammogram.   [facility]  [phone] [address], [address] [place], [place] [address]  

## 2024-01-12 ENCOUNTER — Other Ambulatory Visit: Payer: Self-pay | Admitting: Medical

## 2024-01-12 LAB — CBC WITH DIFFERENTIAL/PLATELET
Basophils Absolute: 0.1 10*3/uL (ref 0.0–0.2)
Basos: 1 %
EOS (ABSOLUTE): 0.1 10*3/uL (ref 0.0–0.4)
Eos: 1 %
Hematocrit: 43.7 % (ref 34.0–46.6)
Hemoglobin: 14 g/dL (ref 11.1–15.9)
Immature Grans (Abs): 0 10*3/uL (ref 0.0–0.1)
Immature Granulocytes: 0 %
Lymphocytes Absolute: 4.3 10*3/uL — ABNORMAL HIGH (ref 0.7–3.1)
Lymphs: 59 %
MCH: 29 pg (ref 26.6–33.0)
MCHC: 32 g/dL (ref 31.5–35.7)
MCV: 91 fL (ref 79–97)
Monocytes Absolute: 0.4 10*3/uL (ref 0.1–0.9)
Monocytes: 5 %
Neutrophils Absolute: 2.4 10*3/uL (ref 1.4–7.0)
Neutrophils: 34 %
Platelets: 292 10*3/uL (ref 150–450)
RBC: 4.83 x10E6/uL (ref 3.77–5.28)
RDW: 13.6 % (ref 11.7–15.4)
WBC: 7.2 10*3/uL (ref 3.4–10.8)

## 2024-01-12 LAB — COMPREHENSIVE METABOLIC PANEL
ALT: 23 IU/L (ref 0–32)
AST: 16 IU/L (ref 0–40)
Albumin: 4.4 g/dL (ref 3.8–4.9)
Alkaline Phosphatase: 94 IU/L (ref 44–121)
BUN/Creatinine Ratio: 11 (ref 9–23)
BUN: 8 mg/dL (ref 6–24)
Bilirubin Total: 0.3 mg/dL (ref 0.0–1.2)
CO2: 22 mmol/L (ref 20–29)
Calcium: 9.1 mg/dL (ref 8.7–10.2)
Chloride: 104 mmol/L (ref 96–106)
Creatinine, Ser: 0.7 mg/dL (ref 0.57–1.00)
Globulin, Total: 2.6 g/dL (ref 1.5–4.5)
Glucose: 119 mg/dL — ABNORMAL HIGH (ref 70–99)
Potassium: 4 mmol/L (ref 3.5–5.2)
Sodium: 140 mmol/L (ref 134–144)
Total Protein: 7 g/dL (ref 6.0–8.5)
eGFR: 103 mL/min/{1.73_m2} (ref >59)

## 2024-01-12 LAB — TSH: TSH: 2.01 u[IU]/mL (ref 0.450–4.500)

## 2024-01-12 LAB — HEMOGLOBIN A1C
Est. average glucose Bld gHb Est-mCnc: 154 mg/dL
Hgb A1c MFr Bld: 7 % — ABNORMAL HIGH (ref 4.8–5.6)

## 2024-01-12 LAB — SEDIMENTATION RATE: Sed Rate: 20 mm/h (ref 0–40)

## 2024-01-12 MED ORDER — HYDROXYZINE HCL 10 MG PO TABS: 10.0000 mg | ORAL_TABLET | Freq: Three times a day (TID) | ORAL | 0 refills | Status: AC | PRN

## 2024-01-12 NOTE — Progress Notes (Signed)
Results sent through MyChart

## 2024-01-14 ENCOUNTER — Other Ambulatory Visit: Payer: Self-pay | Admitting: Medical

## 2024-01-14 ENCOUNTER — Telehealth: Payer: Self-pay | Admitting: Medical

## 2024-01-14 MED ORDER — METFORMIN HCL 500 MG PO TABS: 500.0000 mg | ORAL_TABLET | Freq: Every day | ORAL | 0 refills | Status: DC

## 2024-01-14 NOTE — Telephone Encounter (Signed)
See results

## 2024-01-14 NOTE — Telephone Encounter (Signed)
Pt would like a call back about her lab results

## 2024-03-31 ENCOUNTER — Ambulatory Visit
Admission: RE | Admit: 2024-03-31 | Discharge: 2024-03-31 | Disposition: A | Discharge status: Home / Self Care | Source: Ambulatory Visit | Attending: Medical | Admitting: Medical

## 2024-04-11 ENCOUNTER — Other Ambulatory Visit: Payer: Self-pay | Admitting: Medical

## 2024-04-11 LAB — FECAL OCCULT BLOOD, IMMUNOCHEMICAL: IFOBT: NEGATIVE

## 2024-04-12 ENCOUNTER — Ambulatory Visit: Payer: Self-pay

## 2024-04-19 ENCOUNTER — Ambulatory Visit: Payer: Self-pay | Admitting: Internal Medicine

## 2024-04-21 ENCOUNTER — Ambulatory Visit: Admitting: Medical

## 2024-04-21 VITALS — BP 136/72 | HR 73 | Wt 215.8 lb

## 2024-04-21 DIAGNOSIS — M25511 Pain in right shoulder: Secondary | ICD-10-CM | POA: Diagnosis not present

## 2024-04-21 DIAGNOSIS — H01001 Unspecified blepharitis right upper eyelid: Secondary | ICD-10-CM

## 2024-04-21 DIAGNOSIS — H01004 Unspecified blepharitis left upper eyelid: Secondary | ICD-10-CM | POA: Diagnosis not present

## 2024-04-21 DIAGNOSIS — M25611 Stiffness of right shoulder, not elsewhere classified: Secondary | ICD-10-CM | POA: Diagnosis not present

## 2024-04-21 MED ORDER — SULFACETAMIDE-PREDNISOLONE 10-0.23 % OP SOLN: 1.0000 [drp] | OPHTHALMIC | 0 refills | Status: AC

## 2024-04-21 MED ORDER — HYDROCODONE-ACETAMINOPHEN 5-325 MG PO TABS: 1.0000 | ORAL_TABLET | Freq: Two times a day (BID) | ORAL | 0 refills | Status: AC | PRN

## 2024-04-21 MED ORDER — IBUPROFEN 600 MG PO TABS: 600.0000 mg | ORAL_TABLET | Freq: Three times a day (TID) | ORAL | 0 refills | Status: AC | PRN
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
## 2024-04-21 NOTE — Patient Instructions (Addendum)
 Right shoulder pain and injury, decreased range of motion of shoulder You are weak in the shoulder and have reduced range of motion.  This could be an acute sprain injury but there could also be a potential partial tear of the rotator cuff Over the weekend I recommend using an arm sling to rest the arm most of the day.  You can take your arm out and move around a little bit to avoid frozen shoulder Begin ibuprofen  600 mg prescription 2-3 times a day for the next 4 to 5 days for pain and inflammation For worse or breakthrough pain you can use Norco hydrocodone  no more than twice daily this weekend.  Caution as this will make you sleepy Lets give it a few days this weekend and see if this calms down. By mid next week if you are feeling much better try to do some range of motion exercises demonstrated today If not any improved by midweek we may need to pursue MRI of shoulder or physical therapy referral  Blepharitis of eyelids Continue cool compress for 20 minutes several times a day through the weekend Use the shower to flush the face and eyes in the morning Begin short-term sulfacetamide prednisolone eyedrops, every 3-4 hours during the daytime but not at night.  You can use this in both eyes Avoid mascara and make-up this weekend If not much improved by Monday let us  know

## 2024-04-26 ENCOUNTER — Ambulatory Visit: Admitting: Medical

## 2024-10-29 ENCOUNTER — Emergency Department (HOSPITAL_COMMUNITY)

## 2024-10-29 ENCOUNTER — Emergency Department (HOSPITAL_COMMUNITY)
Admission: EM | Admit: 2024-10-29 | Discharge: 2024-10-29 | Disposition: A | Discharge status: Home / Self Care | Attending: Emergency Medicine | Admitting: Emergency Medicine

## 2024-10-29 ENCOUNTER — Other Ambulatory Visit: Payer: Self-pay

## 2024-10-29 ENCOUNTER — Encounter (HOSPITAL_COMMUNITY): Payer: Self-pay | Admitting: *Deleted

## 2024-10-29 DIAGNOSIS — G43809 Other migraine, not intractable, without status migrainosus: Secondary | ICD-10-CM | POA: Insufficient documentation

## 2024-10-29 DIAGNOSIS — Z853 Personal history of malignant neoplasm of breast: Secondary | ICD-10-CM | POA: Insufficient documentation

## 2024-10-29 DIAGNOSIS — Z8616 Personal history of COVID-19: Secondary | ICD-10-CM | POA: Diagnosis not present

## 2024-10-29 DIAGNOSIS — R519 Headache, unspecified: Secondary | ICD-10-CM | POA: Diagnosis present

## 2024-10-29 LAB — BASIC METABOLIC PANEL WITH GFR
Anion gap: 10 (ref 5–15)
BUN: 10 mg/dL (ref 6–20)
CO2: 22 mmol/L (ref 22–32)
Calcium: 8.7 mg/dL — ABNORMAL LOW (ref 8.9–10.3)
Chloride: 106 mmol/L (ref 98–111)
Creatinine, Ser: 0.84 mg/dL (ref 0.44–1.00)
GFR, Estimated: >60 mL/min (ref >60)
Glucose, Bld: 127 mg/dL — ABNORMAL HIGH (ref 70–99)
Potassium: 3.7 mmol/L (ref 3.5–5.1)
Sodium: 138 mmol/L (ref 135–145)

## 2024-10-29 LAB — CBC
HCT: 44.9 % (ref 36.0–46.0)
Hemoglobin: 14.6 g/dL (ref 12.0–15.0)
MCH: 28.9 pg (ref 26.0–34.0)
MCHC: 32.5 g/dL (ref 30.0–36.0)
MCV: 88.9 fL (ref 80.0–100.0)
Platelets: 307 K/uL (ref 150–400)
RBC: 5.05 MIL/uL (ref 3.87–5.11)
RDW: 14 % (ref 11.5–15.5)
WBC: 10.5 K/uL (ref 4.0–10.5)
nRBC: 0 % (ref 0.0–0.2)

## 2024-10-29 LAB — TROPONIN I (HIGH SENSITIVITY): Troponin I (High Sensitivity): 4 ng/L (ref <18)

## 2024-10-29 MED ORDER — LACTATED RINGERS IV BOLUS
1000.0000 mL | Freq: Once | INTRAVENOUS | Status: AC
  Administered 2024-10-29: 1000 mL via INTRAVENOUS

## 2024-10-29 MED ORDER — DIPHENHYDRAMINE HCL 50 MG/ML IJ SOLN
12.5000 mg | Freq: Once | INTRAMUSCULAR | Status: AC
  Administered 2024-10-29: 12.5 mg via INTRAVENOUS
  Filled 2024-10-29: qty 1

## 2024-10-29 MED ORDER — KETOROLAC TROMETHAMINE 15 MG/ML IJ SOLN
15.0000 mg | Freq: Once | INTRAMUSCULAR | Status: AC
  Administered 2024-10-29: 15 mg via INTRAVENOUS
  Filled 2024-10-29: qty 1

## 2024-10-29 MED ORDER — PROCHLORPERAZINE EDISYLATE 10 MG/2ML IJ SOLN
10.0000 mg | Freq: Once | INTRAMUSCULAR | Status: AC
  Administered 2024-10-29: 10 mg via INTRAVENOUS
  Filled 2024-10-29: qty 2
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
## 2024-10-29 NOTE — Discharge Instructions (Addendum)
 We evaluated you for your headache and dizziness.  Your testing in the emergency department was reassuring.  Your CT scan was negative.  Your symptoms improved with treatment in the emergency department.  We suspect that your headache and dizziness was due to a migraine. Please take Tylenol  (acetaminophen ) and Motrin  (ibuprofen ) for your symptoms at home.  You can take 1000 mg of Tylenol  every 6 hours and 600 mg of Motrin  every 6 hours as needed for your symptoms.  You can take these medicines together as needed, either at the same time, or alternating every 3 hours.   Please make sure to get lots of rest.  Please follow-up closely with your primary doctor.  If you have any new or worsening symptoms, please return to the emergency department for reassessment.

## 2024-10-29 NOTE — ED Triage Notes (Signed)
 Headache and dizziness with nausea since the day before thanksgiving

## 2024-10-31 ENCOUNTER — Inpatient Hospital Stay: Admitting: Medical

## 2024-10-31 ENCOUNTER — Telehealth: Payer: Self-pay | Admitting: Medical

## 2024-10-31 NOTE — Telephone Encounter (Unsigned)
 Copied from CRM #[id number]. Topic: Clinical - Medication Refill >> Oct 31, 2024  2:37 PM Fonda T wrote: Medication: metFORMIN  (GLUCOPHAGE ) 500 MG tablet  Has the patient contacted their pharmacy? Yes, advised to contact office   This is the patient's preferred pharmacy:  [id number] [address], [place] - [address] [place] [address] Phone: [phone] Fax: [phone]   Is this the correct pharmacy for this prescription? Yes If no, delete pharmacy and type the correct one.   Has the prescription been filled recently? Yes  Is the patient out of the medication? Yes  Has the patient been seen for an appointment in the last year OR does the patient have an upcoming appointment? Yes   Can we respond through MyChart? No, prefers phone call to [phone]    Agent: Please be advised that Rx refills may take up to 3 business days. We ask that you follow-up with your pharmacy.

## 2024-11-01 ENCOUNTER — Other Ambulatory Visit: Payer: Self-pay | Admitting: Medical

## 2024-11-13 ENCOUNTER — Encounter (HOSPITAL_COMMUNITY): Payer: Self-pay

## 2024-11-13 ENCOUNTER — Other Ambulatory Visit: Payer: Self-pay

## 2024-11-13 ENCOUNTER — Emergency Department (HOSPITAL_COMMUNITY)
Admission: EM | Admit: 2024-11-13 | Discharge: 2024-11-13 | Disposition: A | Discharge status: Home / Self Care | Attending: Emergency Medicine | Admitting: Emergency Medicine

## 2024-11-13 ENCOUNTER — Emergency Department (HOSPITAL_COMMUNITY)

## 2024-11-13 DIAGNOSIS — H8110 Benign paroxysmal vertigo, unspecified ear: Secondary | ICD-10-CM

## 2024-11-13 LAB — BASIC METABOLIC PANEL WITH GFR
Anion gap: 9 (ref 5–15)
BUN: 9 mg/dL (ref 6–20)
CO2: 26 mmol/L (ref 22–32)
Calcium: 8.9 mg/dL (ref 8.9–10.3)
Chloride: 107 mmol/L (ref 98–111)
Creatinine, Ser: 0.77 mg/dL (ref 0.44–1.00)
GFR, Estimated: >60 mL/min (ref >60)
Glucose, Bld: 115 mg/dL — ABNORMAL HIGH (ref 70–99)
Potassium: 3.3 mmol/L — ABNORMAL LOW (ref 3.5–5.1)
Sodium: 142 mmol/L (ref 135–145)

## 2024-11-13 LAB — CBC WITH DIFFERENTIAL/PLATELET
Abs Immature Granulocytes: 0.01 K/uL (ref 0.00–0.07)
Basophils Absolute: 0.1 K/uL (ref 0.0–0.1)
Basophils Relative: 1 %
Eosinophils Absolute: 0.1 K/uL (ref 0.0–0.5)
Eosinophils Relative: 1 %
HCT: 46.8 % — ABNORMAL HIGH (ref 36.0–46.0)
Hemoglobin: 15.3 g/dL — ABNORMAL HIGH (ref 12.0–15.0)
Immature Granulocytes: 0 %
Lymphocytes Relative: 59 %
Lymphs Abs: 4.3 K/uL — ABNORMAL HIGH (ref 0.7–4.0)
MCH: 29.3 pg (ref 26.0–34.0)
MCHC: 32.7 g/dL (ref 30.0–36.0)
MCV: 89.5 fL (ref 80.0–100.0)
Monocytes Absolute: 0.5 K/uL (ref 0.1–1.0)
Monocytes Relative: 6 %
Neutro Abs: 2.5 K/uL (ref 1.7–7.7)
Neutrophils Relative %: 33 %
Platelets: 307 K/uL (ref 150–400)
RBC: 5.23 MIL/uL — ABNORMAL HIGH (ref 3.87–5.11)
RDW: 13.9 % (ref 11.5–15.5)
WBC: 7.4 K/uL (ref 4.0–10.5)
nRBC: 0 % (ref 0.0–0.2)

## 2024-11-13 MED ORDER — MECLIZINE HCL 25 MG PO TABS: 25.0000 mg | ORAL_TABLET | Freq: Three times a day (TID) | ORAL | 0 refills | Status: AC | PRN

## 2024-11-13 MED ORDER — MECLIZINE HCL 25 MG PO TABS
25.0000 mg | ORAL_TABLET | Freq: Once | ORAL | Status: AC
  Administered 2024-11-13: 25 mg via ORAL
  Filled 2024-11-13: qty 1

## 2024-11-13 MED ORDER — POTASSIUM CHLORIDE CRYS ER 20 MEQ PO TBCR
40.0000 meq | EXTENDED_RELEASE_TABLET | Freq: Once | ORAL | Status: AC
  Administered 2024-11-13: 40 meq via ORAL
  Filled 2024-11-13: qty 2

## 2024-11-13 MED ORDER — LACTATED RINGERS IV BOLUS
1000.0000 mL | Freq: Once | INTRAVENOUS | Status: AC
  Administered 2024-11-13: 1000 mL via INTRAVENOUS

## 2024-11-13 MED ORDER — AMLODIPINE BESYLATE 5 MG PO TABS: 5.0000 mg | ORAL_TABLET | Freq: Every day | ORAL | 0 refills | Status: AC

## 2024-11-13 NOTE — Discharge Instructions (Signed)
 Please read the attached information about [facility].  Follow up with the ear nose and throat doctor.

## 2024-11-13 NOTE — ED Provider Triage Note (Signed)
 Emergency Medicine Provider Triage Evaluation Note  Tara Rodriguez , a 53 y.o. female  was evaluated in triage.  Pt complains of dizziness that she describes as the room spinning.  She states it started yesterday around 6 AM shortly before getting off work.  Patient did recently start lisinopril on 12/3.  She has been keeping track of her blood pressures that have been ranging in the 140s to 150s systolic and 90s diastolic.  Patient denies any LOC.  No headache or additional pain.  Review of Systems  Positive: Dizziness Negative:   Physical Exam  BP (!) 157/103   Pulse 76   Temp 98 F (36.7 C)   Resp 18   SpO2 100%  Gen:   Awake, no distress   Resp:  Normal effort  MSK:   Moves extremities without difficulty.  Strength, sensation and coordination intact in upper and lower extremities.  No neurologic deficits are appreciated. Other:    Medical Decision Making  Medically screening exam initiated at 1:51 PM.  Appropriate orders placed.  Tara Rodriguez was informed that the remainder of the evaluation will be completed by another provider, this initial triage assessment does not replace that evaluation, and the importance of remaining in the ED until their evaluation is complete.  Labs, urine, CT, EKG ordered.   Torrence Marry RAMAN, PA-C 11/13/24 1356

## 2024-11-13 NOTE — ED Provider Notes (Signed)
 [place] EMERGENCY DEPARTMENT AT [facility] Provider Note   CSN: [id number] Arrival date & time: 11/13/24  1337     Patient presents with: No chief complaint on file.   Tara Rodriguez is a 53 y.o. female.   HPI  Patient is a 53 year old female with past medical history significant for migraines, breast cancer, anemia, vitamin D  deficiency  She presents emergency room today with complaints of lightheadedness, dizziness and fatigue.  Seems that when she stands up or looks up she will feel like the room is spinning.  She states this does not feel this way at rest.  She denies any slurred speech confusion head injuries loss of consciousness nausea, vomiting or any other new or concerning symptoms.  She has no limb weakness or numbness.  No fevers or chills no urinary frequency urgency dysuria or hematuria.      Prior to Admission medications  Medication Sig Start Date End Date Taking? Authorizing Provider  amLODipine  (NORVASC ) 5 MG tablet Take 1 tablet (5 mg total) by mouth daily. 11/13/24  Yes Borden Thune, Hamp S, PA  meclizine  (ANTIVERT ) 25 MG tablet Take 1 tablet (25 mg total) by mouth 3 (three) times daily as needed for dizziness. 11/13/24  Yes Neyra Pettie, Hamp S, PA  HYDROcodone -acetaminophen  (NORCO/VICODIN) 5-325 MG tablet Take 1 tablet by mouth 2 (two) times daily as needed for moderate pain (pain score 4-6). 04/21/24   Tysinger, Alm RAMAN, PA-C  hydrOXYzine  (ATARAX ) 10 MG tablet Take 1 tablet (10 mg total) by mouth 3 (three) times daily as needed. Patient not taking: Reported on 04/21/2024 01/12/24   Tysinger, Alm RAMAN, PA-C  ibuprofen  (ADVIL ) 600 MG tablet Take 1 tablet (600 mg total) by mouth every 8 (eight) hours as needed. 04/21/24   Tysinger, Alm RAMAN, PA-C  metFORMIN  (GLUCOPHAGE ) 500 MG tablet TAKE 1 TABLET BY MOUTH EVERY DAY WITH BREAKFAST 11/01/24   Tysinger, Alm RAMAN, PA-C  sulfacetamide -prednisoLONE  (VASOCIDIN ) 10-0.23 % ophthalmic solution Place 1 drop into both eyes  every 3 (three) hours while awake. 04/21/24   Tysinger, Alm RAMAN, PA-C    Allergies: Patient has no known allergies.    Review of Systems  Updated Vital Signs BP (!) 153/93 (BP Location: Right Arm)   Pulse 71   Temp 98.1 F (36.7 C) (Oral)   Resp 18   SpO2 97%   Physical Exam Vitals and nursing note reviewed.  Constitutional:      General: She is not in acute distress. HENT:     Head: Normocephalic and atraumatic.     Nose: Nose normal.  Eyes:     General: No scleral icterus. Cardiovascular:     Rate and Rhythm: Normal rate and regular rhythm.     Pulses: Normal pulses.     Heart sounds: Normal heart sounds.  Pulmonary:     Effort: Pulmonary effort is normal. No respiratory distress.     Breath sounds: No wheezing.  Abdominal:     Palpations: Abdomen is soft.     Tenderness: There is no abdominal tenderness.  Musculoskeletal:     Cervical back: Normal range of motion.     Right lower leg: No edema.     Left lower leg: No edema.  Skin:    General: Skin is warm and dry.     Capillary Refill: Capillary refill takes less than 2 seconds.  Neurological:     Mental Status: She is alert. Mental status is at baseline.     Comments: Alert and  oriented to self, place, time and event.   Speech is fluent, clear without dysarthria or dysphasia.   Strength 5/5 in upper/lower extremities   Sensation intact in upper/lower extremities   CN I not tested  CN II grossly intact visual fields bilaterally. Did not visualize posterior eye.  CN III, IV, VI PERRLA and EOMs intact bilaterally  CN V Intact sensation to sharp and light touch to the face  CN VII facial movements symmetric  CN VIII not tested  CN IX, X no uvula deviation, symmetric rise of soft palate  CN XI 5/5 SCM and trapezius strength bilaterally  CN XII Midline tongue protrusion, symmetric L/R movements     Psychiatric:        Mood and Affect: Mood normal.        Behavior: Behavior normal.     (all labs ordered  are listed, but only abnormal results are displayed) Labs Reviewed  CBC WITH DIFFERENTIAL/PLATELET - Abnormal; Notable for the following components:      Result Value   RBC 5.23 (*)    Hemoglobin 15.3 (*)    HCT 46.8 (*)    Lymphs Abs 4.3 (*)    All other components within normal limits  BASIC METABOLIC PANEL WITH GFR - Abnormal; Notable for the following components:   Potassium 3.3 (*)    Glucose, Bld 115 (*)    All other components within normal limits  URINALYSIS, ROUTINE W REFLEX MICROSCOPIC    EKG: None  Radiology: CT Head Wo Contrast Result Date: 11/13/2024 CLINICAL DATA:  Syncope. EXAM: CT HEAD WITHOUT CONTRAST TECHNIQUE: Contiguous axial images were obtained from the base of the skull through the vertex without intravenous contrast. RADIATION DOSE REDUCTION: This exam was performed according to the departmental dose-optimization program which includes automated exposure control, adjustment of the mA and/or kV according to patient size and/or use of iterative reconstruction technique. COMPARISON:  10/29/2024 FINDINGS: Brain: No evidence of intracranial hemorrhage, acute infarction, hydrocephalus, extra-axial collection, or mass lesion/mass effect. Vascular:  No hyperdense vessel or other acute findings. Skull: No evidence of fracture or other significant bone abnormality. Sinuses/Orbits:  No acute findings. Other: None. IMPRESSION: Negative noncontrast head CT. Electronically Signed   By: Norleen DELENA Kil M.D.   On: 11/13/2024 15:33     Procedures   Medications Ordered in the ED  meclizine  (ANTIVERT ) tablet 25 mg (25 mg Oral Given 11/13/24 1628)  potassium chloride  SA (KLOR-CON  M) CR tablet 40 mEq (40 mEq Oral Given 11/13/24 1628)  lactated ringers  bolus 1,000 mL (0 mLs Intravenous Stopped 11/13/24 1833)    Clinical Course as of 11/13/24 2241  Sun Nov 13, 2024  1607 Lisinopril for 1 week [WF]    Clinical Course User Index [WF] Neldon Hamp RAMAN, [place]                                  Medical Decision Making Risk Prescription drug management.   This patient presents to the ED for concern of dizziness, this involves a number of treatment options, and is a complaint that carries with it a moderate risk of complications and morbidity. A differential diagnosis was considered for the patient's symptoms which is discussed below:   By episode duration  Seconds: perilymphatic fistula Hours: endolymphatic hydrops (Mnire's disease, syphilis) Days: labyrinthitis, labyrinthine concussion (head trauma) Months: acoustic neuroma, ototoxicity  Auditory symptoms absent  Seconds: positioning vertigo (cupulolithiasis), vertebrobasilar insufficiency, cervical vertigo (head-extension  vertigo), diplopia Hours: recurrent vestibulopathy (Mnire's disease without auditory symptoms), vestibular migraine Days: vestibular neuronitis, head trauma Months: vertebrobasilar insufficiency, arteriovenous malformation, brainstem or cerebellar tumor, cerebellar degeneration, multiple sclerosis, vertebrobasilar migraine Drugs: Anticonvulsants (eg, phenytoin), antibiotics (eg, aminoglycosides, doxycycline, metronidazole ), hypnotics (eg, diazepam), analgesics (eg, aspirin), alcohol    Co morbidities: Discussed in HPI   Brief History:  Patient is a 53 year old female with past medical history significant for migraines, breast cancer, anemia, vitamin D  deficiency  She presents emergency room today with complaints of lightheadedness, dizziness and fatigue.  Seems that when she stands up or looks up she will feel like the room is spinning.  She states this does not feel this way at rest.  She denies any slurred speech confusion head injuries loss of consciousness nausea, vomiting or any other new or concerning symptoms.  She has no limb weakness or numbness.  No fevers or chills no urinary frequency urgency dysuria or hematuria.     EMR reviewed including pt PMHx, past surgical history and  past visits to ER.   See HPI for more details   Lab Tests:   I ordered and independently interpreted labs. Labs notable for Mild hypokalemia, erythrocytosis consistent with dehydration and hemoconcentration.  Imaging Studies:  NAD. I personally reviewed all imaging studies and no acute abnormality found. I agree with radiology interpretation.  Unremarkable CT head Cardiac Monitoring:  The patient was maintained on a cardiac monitor.  I personally viewed and interpreted the cardiac monitored which showed an underlying rhythm of: NSR EKG non-ischemic normal EKG   Medicines ordered:  I ordered medication including lactated Ringer 's 1 L, potassium, meclizine  for hydration, hypokalemia, dizziness Reevaluation of the patient after these medicines showed that the patient improved I have reviewed the patients home medicines and have made adjustments as needed   Critical Interventions:     Consults/Attending Physician      Reevaluation:  After the interventions noted above I re-evaluated patient and found that they have :improved   Social Determinants of Health:      Problem List / ED Course:  Patient symptoms seem to have improved but have not completely abated.  She is not feeling any symptoms currently but did say that when she stood up and went to the bathroom she felt somewhat dizzy.  She appears well has normal vital signs was started on lisinopril 1 week ago I will switch her to amlodipine  and send her home with meclizine  in case the lisinopril is what is causing her vertigo and lightheadedness.  Return precautions to the emergency room provided.  Recommend that she hydrate and follow-up with primary care ASAP.  I suspect BPPV given that her symptoms seem to be positional.  Doubt stroke acoustic neuroma or MS as her etiology of vertigo given that this is not persistent.   Dispostion:  After consideration of the diagnostic results and the patients response to  treatment, I feel that the patent would benefit from outpatient follow-up   Final diagnoses:  Benign paroxysmal positional vertigo, unspecified laterality    ED Discharge Orders          Ordered    amLODipine  (NORVASC ) 5 MG tablet  Daily        11/13/24 1827    meclizine  (ANTIVERT ) 25 MG tablet  3 times daily PRN        11/13/24 1828               Neldon Hamp RAMAN, [place] 11/13/24 2241  Kingsley, Victoria K, DO 11/13/24 2340

## 2024-11-13 NOTE — ED Notes (Signed)
 Urine sample unable to be obtained. Pt has urine cup

## 2024-11-13 NOTE — ED Triage Notes (Signed)
 Pt c/o lightheadedness, and dizziness started at 0600 yesterday. Pt was already awake when it started. Pt LKW [id number].
